# Patient Record
Sex: Female | Born: 1985
Health system: Southern US, Community
[De-identification: ages and names within clinical notes are randomized; demographics above are authoritative.]

## PROBLEM LIST (undated history)

## (undated) DIAGNOSIS — B9689 Other specified bacterial agents as the cause of diseases classified elsewhere: Secondary | ICD-10-CM

## (undated) DIAGNOSIS — N760 Acute vaginitis: Secondary | ICD-10-CM

## (undated) HISTORY — PX: WISDOM TOOTH EXTRACTION: SHX21

## (undated) HISTORY — DX: Other specified bacterial agents as the cause of diseases classified elsewhere: N76.0

## (undated) HISTORY — DX: Other specified bacterial agents as the cause of diseases classified elsewhere: B96.89

---

## 2011-11-01 ENCOUNTER — Inpatient Hospital Stay: Payer: Self-pay | Admitting: Obstetrics & Gynecology

## 2013-02-11 ENCOUNTER — Other Ambulatory Visit: Payer: Self-pay | Admitting: Physician Assistant

## 2013-02-11 LAB — COMPREHENSIVE METABOLIC PANEL
Albumin: 3.5 g/dL (ref 3.4–5.0)
Alkaline Phosphatase: 97 U/L (ref 50–136)
Anion Gap: 2 — ABNORMAL LOW (ref 7–16)
Calcium, Total: 8.2 mg/dL — ABNORMAL LOW (ref 8.5–10.1)
Chloride: 106 mmol/L (ref 98–107)
EGFR (African American): >60
Glucose: 87 mg/dL (ref 65–99)
Osmolality: 273 (ref 275–301)
SGOT(AST): 63 U/L — ABNORMAL HIGH (ref 15–37)
SGPT (ALT): 77 U/L (ref 12–78)
Total Protein: 7.6 g/dL (ref 6.4–8.2)

## 2013-02-11 LAB — CBC WITH DIFFERENTIAL/PLATELET
Eosinophil #: 0.1 10*3/uL (ref 0.0–0.7)
Eosinophil %: 2.3 %
HGB: 12.4 g/dL (ref 12.0–16.0)
Lymphocyte %: 42.3 %
MCHC: 34.9 g/dL (ref 32.0–36.0)
MCV: 89 fL (ref 80–100)
Monocyte #: 0.7 x10 3/mm (ref 0.2–0.9)
RBC: 4 10*6/uL (ref 3.80–5.20)
RDW: 14 % (ref 11.5–14.5)
WBC: 5.3 10*3/uL (ref 3.6–11.0)

## 2013-11-22 ENCOUNTER — Observation Stay: Payer: Self-pay

## 2013-11-23 ENCOUNTER — Inpatient Hospital Stay: Payer: Self-pay

## 2013-11-23 LAB — CBC WITH DIFFERENTIAL/PLATELET
Basophil #: 0.1 10*3/uL (ref 0.0–0.1)
Basophil %: 0.6 %
EOS PCT: 0.5 %
Eosinophil #: 0.1 10*3/uL (ref 0.0–0.7)
HCT: 40.2 % (ref 35.0–47.0)
HGB: 13.6 g/dL (ref 12.0–16.0)
Lymphocyte #: 2.5 10*3/uL (ref 1.0–3.6)
Lymphocyte %: 22.4 %
MCH: 32.1 pg (ref 26.0–34.0)
MCHC: 33.9 g/dL (ref 32.0–36.0)
MCV: 95 fL (ref 80–100)
MONOS PCT: 9.6 %
Monocyte #: 1.1 x10 3/mm — ABNORMAL HIGH (ref 0.2–0.9)
Neutrophil #: 7.5 10*3/uL — ABNORMAL HIGH (ref 1.4–6.5)
Neutrophil %: 66.9 %
Platelet: 239 10*3/uL (ref 150–440)
RBC: 4.25 10*6/uL (ref 3.80–5.20)
RDW: 13.5 % (ref 11.5–14.5)
WBC: 11.1 10*3/uL — ABNORMAL HIGH (ref 3.6–11.0)

## 2013-11-24 LAB — HEMATOCRIT: HCT: 36.2 % (ref 35.0–47.0)

## 2015-02-01 ENCOUNTER — Emergency Department: Payer: Self-pay | Admitting: Emergency Medicine

## 2015-02-01 LAB — RAPID INFLUENZA A&B ANTIGENS

## 2015-03-14 NOTE — H&P (Signed)
L&D Evaluation:  History:  Presents with leaking fluid   Patient's Medical History No Chronic Illness   Patient's Surgical History none   Medications Pre Natal Vitamins   Allergies NKDA   Social History none   ROS:  ROS All systems were reviewed.  HEENT, CNS, GI, GU, Respiratory, CV, Renal and Musculoskeletal systems were found to be normal.   Exam:  Vital Signs stable   Urine Protein not completed   General no apparent distress   Mental Status clear   Abdomen gravid, non-tender   Estimated Fetal Weight Average for gestational age   Fetal Position vtx   Edema no edema   Pelvic 1-2/50/-2   Mebranes Intact, NTZ -, Crist FatFern -, speculum exam -   FHT normal rate with no decels   Fetal Heart Rate 145   Ucx irregular   Ucx Pain Scale 0   Skin dry   Impression:  Impression reactive NST, leukorrhea   Plan:  Plan EFM/NST, discharge   Electronic Signatures: Ulyses AmorBurr, Melody N (CNM)  (Signed 19-Jan-15 10:21)  Authored: L&D Evaluation   Last Updated: 19-Jan-15 10:21 by Ulyses AmorBurr, Melody N (CNM)

## 2015-03-14 NOTE — H&P (Signed)
L&D Evaluation:  History:  HPI 29yo MWF presents with c/o contractions all day, [id number], at 972w4d. Normal PNC to date; did have LLP with resolution by 30 weeks.   Presents with contractions   Patient's Medical History No Chronic Illness   Patient's Surgical History none   Medications Pre Natal Vitamins   Allergies NKDA   Social History none   Family History Non-Contributory   ROS:  ROS All systems were reviewed.  HEENT, CNS, GI, GU, Respiratory, CV, Renal and Musculoskeletal systems were found to be normal.   Exam:  Vital Signs stable   Urine Protein not completed   General no apparent distress   Mental Status clear   Chest clear   Heart normal sinus rhythm   Abdomen gravid, tender with contractions   Estimated Fetal Weight Average for gestational age   Fetal Position vtx   Back no CVAT   Edema no edema   Pelvic 3/80/-1 per RN exam   Mebranes Intact   FHT normal rate with no decels   Fetal Heart Rate 150   Ucx regular   Ucx Frequency 5 min   Length of each Contraction 60 seconds   Ucx Pain Scale 8   Skin dry   Impression:  Impression early labor   Electronic Signatures: Ulyses AmorBurr, Joeleen Wortley N (CNM)  (Signed 20-Jan-15 18:18)  Authored: L&D Evaluation   Last Updated: 20-Jan-15 18:18 by Ulyses AmorBurr, Sean Malinowski N (CNM)

## 2015-03-20 DIAGNOSIS — K6289 Other specified diseases of anus and rectum: Secondary | ICD-10-CM

## 2015-05-04 ENCOUNTER — Encounter: Payer: Self-pay | Admitting: Obstetrics and Gynecology

## 2015-05-23 ENCOUNTER — Encounter: Payer: Self-pay | Admitting: Obstetrics and Gynecology

## 2015-05-23 ENCOUNTER — Ambulatory Visit (INDEPENDENT_AMBULATORY_CARE_PROVIDER_SITE_OTHER): Payer: 59 | Admitting: Obstetrics and Gynecology

## 2015-05-23 VITALS — BP 119/77 | HR 63 | Ht 70.0 in | Wt 162.5 lb

## 2015-05-23 DIAGNOSIS — Z01419 Encounter for gynecological examination (general) (routine) without abnormal findings: Secondary | ICD-10-CM | POA: Diagnosis not present

## 2015-05-23 DIAGNOSIS — B359 Dermatophytosis, unspecified: Secondary | ICD-10-CM | POA: Diagnosis not present

## 2015-05-23 MED ORDER — FLUCONAZOLE 100 MG PO TABS: 100.0000 mg | ORAL_TABLET | Freq: Every day | ORAL | Status: DC

## 2015-05-23 MED ORDER — HYDROXYZINE HCL 25 MG PO TABS: 25.0000 mg | ORAL_TABLET | Freq: Four times a day (QID) | ORAL | Status: DC | PRN

## 2015-05-23 MED ORDER — KETOCONAZOLE 2 % EX SHAM: 1.0000 "application " | MEDICATED_SHAMPOO | CUTANEOUS | Status: DC

## 2015-05-23 NOTE — Patient Instructions (Addendum)
Thank you for enrolling in [facility]. Please follow the instructions below to securely access your online medical record. MyChart allows you to send messages to your doctor, view your test results, renew your prescriptions, schedule appointments, and more.  How Do I Sign Up? 1. In your Internet browser, go to [email or web address]. 2. Click on the New  User? link in the Sign In box.  3. Enter your MyChart Access Code exactly as it appears below. You will not need to use this code after you have completed the sign-up process. If you do not sign up before the expiration date, you must request a new code. MyChart Access Code: [email or web address] Expires: 07/22/2015  9:44 AM  4. Enter the last four digits of your Social Security Number (xxxx) and Date of Birth (mm/dd/yyyy) as indicated and click Next. You will be taken to the next sign-up page. 5. Create a MyChart ID. This will be your MyChart login ID and cannot be changed, so think of one that is secure and easy to remember. 6. Create a MyChart password. You can change your password at any time. 7. Enter your Password Reset Question and Answer and click Next. This can be used at a later time if you forget your password.  8. Select your communication preference, and if applicable enter your e-mail address. You will receive e-mail notification when new information is available in MyChart by choosing to receive e-mail notifications and filling in your e-mail. 9. Click Sign In. You can now view your medical record.   Additional Information If you have questions, you can email [email or web address] or call [phone] to talk to our [facility] staff. Remember, MyChart is NOT to be used for urgent needs. For medical emergencies, dial 911.   Health Maintenance Adopting a healthy lifestyle and getting preventive care can go a long way to promote health and wellness. Talk with your health care provider about what schedule of regular  examinations is right for you. This is a good chance for you to check in with your provider about disease prevention and staying healthy. In between checkups, there are plenty of things you can do on your own. Experts have done a lot of research about which lifestyle changes and preventive measures are most likely to keep you healthy. Ask your health care provider for more information. WEIGHT AND DIET  Eat a healthy diet  Be sure to include plenty of vegetables, fruits, low-fat dairy products, and lean protein.  Do not eat a lot of foods high in solid fats, added sugars, or salt.  Get regular exercise. This is one of the most important things you can do for your health.  Most adults should exercise for at least 150 minutes each week. The exercise should increase your heart rate and make you sweat (moderate-intensity exercise).  Most adults should also do strengthening exercises at least twice a week. This is in addition to the moderate-intensity exercise.  Maintain a healthy weight  Body mass index (BMI) is a measurement that can be used to identify possible weight problems. It estimates body fat based on height and weight. Your health care provider can help determine your BMI and help you achieve or maintain a healthy weight.  For females 67 years of age and older:   A BMI below 18.5 is considered underweight.  A BMI of 18.5 to 24.9 is normal.  A BMI of 25 to 29.9 is considered overweight.  A BMI of 30 and above is  considered obese.  Watch levels of cholesterol and blood lipids  You should start having your blood tested for lipids and cholesterol at 30 years of age, then have this test every 5 years.  You may need to have your cholesterol levels checked more often if:  Your lipid or cholesterol levels are high.  You are older than 30 years of age.  You are at high risk for heart disease.  CANCER SCREENING   Lung Cancer  Lung cancer screening is recommended for adults  28-45 years old who are at high risk for lung cancer because of a history of smoking.  A yearly low-dose CT scan of the lungs is recommended for people who:  Currently smoke.  Have quit within the past 15 years.  Have at least a 30-pack-year history of smoking. A pack year is smoking an average of one pack of cigarettes a day for 1 year.  Yearly screening should continue until it has been 15 years since you quit.  Yearly screening should stop if you develop a health problem that would prevent you from having lung cancer treatment.  Breast Cancer  Practice breast self-awareness. This means understanding how your breasts normally appear and feel.  It also means doing regular breast self-exams. Let your health care provider know about any changes, no matter how small.  If you are in your 20s or 30s, you should have a clinical breast exam (CBE) by a health care provider every 1-3 years as part of a regular health exam.  If you are 27 or older, have a CBE every year. Also consider having a breast X-ray (mammogram) every year.  If you have a family history of breast cancer, talk to your health care provider about genetic screening.  If you are at high risk for breast cancer, talk to your health care provider about having an MRI and a mammogram every year.  Breast cancer gene (BRCA) assessment is recommended for women who have family members with BRCA-related cancers. BRCA-related cancers include:  Breast.  Ovarian.  Tubal.  Peritoneal cancers.  Results of the assessment will determine the need for genetic counseling and BRCA1 and BRCA2 testing. Cervical Cancer Routine pelvic examinations to screen for cervical cancer are no longer recommended for nonpregnant women who are considered low risk for cancer of the pelvic organs (ovaries, uterus, and vagina) and who do not have symptoms. A pelvic examination may be necessary if you have symptoms including those associated with pelvic  infections. Ask your health care provider if a screening pelvic exam is right for you.   The Pap test is the screening test for cervical cancer for women who are considered at risk.  If you had a hysterectomy for a problem that was not cancer or a condition that could lead to cancer, then you no longer need Pap tests.  If you are older than 65 years, and you have had normal Pap tests for the past 10 years, you no longer need to have Pap tests.  If you have had past treatment for cervical cancer or a condition that could lead to cancer, you need Pap tests and screening for cancer for at least 20 years after your treatment.  If you no longer get a Pap test, assess your risk factors if they change (such as having a new sexual partner). This can affect whether you should start being screened again.  Some women have medical problems that increase their chance of getting cervical cancer. If this is  the case for you, your health care provider may recommend more frequent screening and Pap tests.  The human papillomavirus (HPV) test is another test that may be used for cervical cancer screening. The HPV test looks for the virus that can cause cell changes in the cervix. The cells collected during the Pap test can be tested for HPV.  The HPV test can be used to screen women 22 years of age and older. Getting tested for HPV can extend the interval between normal Pap tests from three to five years.  An HPV test also should be used to screen women of any age who have unclear Pap test results.  After 30 years of age, women should have HPV testing as often as Pap tests.  Colorectal Cancer  This type of cancer can be detected and often prevented.  Routine colorectal cancer screening usually begins at 30 years of age and continues through 30 years of age.  Your health care provider may recommend screening at an earlier age if you have risk factors for colon cancer.  Your health care provider may also  recommend using home test kits to check for hidden blood in the stool.  A small camera at the end of a tube can be used to examine your colon directly (sigmoidoscopy or colonoscopy). This is done to check for the earliest forms of colorectal cancer.  Routine screening usually begins at age 22.  Direct examination of the colon should be repeated every 5-10 years through 30 years of age. However, you may need to be screened more often if early forms of precancerous polyps or small growths are found. Skin Cancer  Check your skin from head to toe regularly.  Tell your health care provider about any new moles or changes in moles, especially if there is a change in a mole's shape or color.  Also tell your health care provider if you have a mole that is larger than the size of a pencil eraser.  Always use sunscreen. Apply sunscreen liberally and repeatedly throughout the day.  Protect yourself by wearing long sleeves, pants, a wide-brimmed hat, and sunglasses whenever you are outside. HEART DISEASE, DIABETES, AND HIGH BLOOD PRESSURE   Have your blood pressure checked at least every 1-2 years. High blood pressure causes heart disease and increases the risk of stroke.  If you are between 58 years and 29 years old, ask your health care provider if you should take aspirin to prevent strokes.  Have regular diabetes screenings. This involves taking a blood sample to check your fasting blood sugar level.  If you are at a normal weight and have a low risk for diabetes, have this test once every three years after 30 years of age.  If you are overweight and have a high risk for diabetes, consider being tested at a younger age or more often. PREVENTING INFECTION  Hepatitis B  If you have a higher risk for hepatitis B, you should be screened for this virus. You are considered at high risk for hepatitis B if:  You were born in a country where hepatitis B is common. Ask your health care provider which  countries are considered high risk.  Your parents were born in a high-risk country, and you have not been immunized against hepatitis B (hepatitis B vaccine).  You have HIV or AIDS.  You use needles to inject street drugs.  You live with someone who has hepatitis B.  You have had sex with someone who has  hepatitis B.  You get hemodialysis treatment.  You take certain medicines for conditions, including cancer, organ transplantation, and autoimmune conditions. Hepatitis C  Blood testing is recommended for:  Everyone born from [birth year] through 1966.  Anyone with known risk factors for hepatitis C. Sexually transmitted infections (STIs)  You should be screened for sexually transmitted infections (STIs) including gonorrhea and chlamydia if:  You are sexually active and are younger than 30 years of age.  You are older than 30 years of age and your health care provider tells you that you are at risk for this type of infection.  Your sexual activity has changed since you were last screened and you are at an increased risk for chlamydia or gonorrhea. Ask your health care provider if you are at risk.  If you do not have HIV, but are at risk, it may be recommended that you take a prescription medicine daily to prevent HIV infection. This is called pre-exposure prophylaxis (PrEP). You are considered at risk if:  You are sexually active and do not regularly use condoms or know the HIV status of your partner(s).  You take drugs by injection.  You are sexually active with a partner who has HIV. Talk with your health care provider about whether you are at high risk of being infected with HIV. If you choose to begin PrEP, you should first be tested for HIV. You should then be tested every 3 months for as long as you are taking PrEP.  PREGNANCY   If you are premenopausal and you may become pregnant, ask your health care provider about preconception counseling.  If you may become pregnant, take  400 to 800 micrograms (mcg) of folic acid every day.  If you want to prevent pregnancy, talk to your health care provider about birth control (contraception). OSTEOPOROSIS AND MENOPAUSE   Osteoporosis is a disease in which the bones lose minerals and strength with aging. This can result in serious bone fractures. Your risk for osteoporosis can be identified using a bone density scan.  If you are 21 years of age or older, or if you are at risk for osteoporosis and fractures, ask your health care provider if you should be screened.  Ask your health care provider whether you should take a calcium or vitamin D supplement to lower your risk for osteoporosis.  Menopause may have certain physical symptoms and risks.  Hormone replacement therapy may reduce some of these symptoms and risks. Talk to your health care provider about whether hormone replacement therapy is right for you.  HOME CARE INSTRUCTIONS   Schedule regular health, dental, and eye exams.  Stay current with your immunizations.   Do not use any tobacco products including cigarettes, chewing tobacco, or electronic cigarettes.  If you are pregnant, do not drink alcohol.  If you are breastfeeding, limit how much and how often you drink alcohol.  Limit alcohol intake to no more than 1 drink per day for nonpregnant women. One drink equals 12 ounces of beer, 5 ounces of wine, or 1 ounces of hard liquor.  Do not use street drugs.  Do not share needles.  Ask your health care provider for help if you need support or information about quitting drugs.  Tell your health care provider if you often feel depressed.  Tell your health care provider if you have ever been abused or do not feel safe at home. Document Released: 05/06/2011 Document Revised: 03/07/2014 Document Reviewed: 09/22/2013 [facility] Patient Information 2015 [place], [place].  This information is not intended to replace advice given to you by your health care provider.  Make sure you discuss any questions you have with your health care provider.

## 2015-05-23 NOTE — Progress Notes (Signed)
  Subjective:     Lori Mcgee is a 29 y.o. female and is here for a comprehensive physical exam. The patient reports problems - btb for about three weeks after menses some months, has paragard IUD in place x 1 year, bleeding is dark brown and light; also reports resh x 3 weeks- started on chin, now on legs and wrist- seen in urgent care and took steriod taper and steriod cream, slightly better but persistant.  History   Social History  . Marital Status: Married    Spouse Name: N/A  . Number of Children: N/A  . Years of Education: N/A   Occupational History  . Not on file.   Social History Main Topics  . Smoking status: Never Smoker   . Smokeless tobacco: Never Used  . Alcohol Use: No  . Drug Use: No  . Sexual Activity: Yes    Birth Control/ Protection: IUD   Other Topics Concern  . Not on file   Social History Narrative   Health Maintenance  Topic Date Due  . HIV Screening  10/26/2000  . PAP SMEAR  10/27/2003  . TETANUS/TDAP  10/26/2004  . INFLUENZA VACCINE  06/05/2015    The following portions of the patient's history were reviewed and updated as appropriate: allergies, current medications, past family history, past medical history, past social history, past surgical history and problem list.  Review of Systems A comprehensive review of systems was negative except for: Genitourinary: positive for BTB Integument/breast: positive for pruritus and rash   Objective:    General appearance: alert, cooperative and appears stated age Neck: no adenopathy, no carotid bruit, no JVD, supple, symmetrical, trachea midline and thyroid not enlarged, symmetric, no tenderness/mass/nodules Lungs: clear to auscultation bilaterally Breasts: normal appearance, no masses or tenderness Heart: regular rate and rhythm, S1, S2 normal, no murmur, click, rub or gallop Abdomen: soft, non-tender; bowel sounds normal; no masses,  no organomegaly Pelvic: cervix normal in appearance, external  genitalia normal, no adnexal masses or tenderness, no cervical motion tenderness, rectovaginal septum normal, uterus normal size, shape, and consistency and vagina normal without discharge Extremities: extremities normal, atraumatic, no cyanosis or edema Skin: raised rash on upper legs and bilateral wrsit- c/w tinea   IUD string noted with scant dark brown old blood at cervix Assessment:    Healthy female exam. BTB with Paragard; Tinea      Plan:  Pap obtianed, continue health maintenance; RTC 1 year for AE LoLoEstrine sample given -to take 1 pill a day for 3-4 days as needed to stop BTB Rx for Nizoral to apply bid x 1 week and wash off after 1 hour, diflucan 100mg  daily x 7-14 days and vistarile prn use for itching.  RTC as needed if above doesn't resolve.   See After Visit Summary for Counseling Recommendations  Ericca Labra Ines BloomerBurr, CNM

## 2015-05-24 LAB — PAP IG W/ RFLX HPV ASCU: PAP SMEAR COMMENT: 0

## 2015-05-25 ENCOUNTER — Encounter: Payer: Self-pay | Admitting: *Deleted

## 2016-02-28 ENCOUNTER — Telehealth: Payer: Self-pay | Admitting: Obstetrics and Gynecology

## 2016-02-28 NOTE — Telephone Encounter (Signed)
SHE HAS AN IUD HAD REG PERIOD AND THEN IT WENT AWAY, THEN IT CAME BACK FOR A COUPLE DAYS AND SHE HAD LIKE 3 PERIODS IN A ROW. sHE HAS HAD HER IUD PARAGARD FOR A YR NOW. JUST WONDERING WHY HER PERIOD WAS SO IRREGULAR.

## 2016-02-28 NOTE — Telephone Encounter (Signed)
Called pt she has had period 3 x this month, no increased stress, denies heavy bleeding or cramping, pls advise

## 2016-03-01 NOTE — Telephone Encounter (Signed)
Gave a few packs of tatula to try to regulate cycles.

## 2016-03-11 ENCOUNTER — Encounter: Payer: Self-pay | Admitting: Physician Assistant

## 2016-03-11 ENCOUNTER — Ambulatory Visit: Payer: Self-pay | Admitting: Physician Assistant

## 2016-03-11 VITALS — BP 122/80 | HR 80 | Temp 98.9°F

## 2016-03-11 DIAGNOSIS — J018 Other acute sinusitis: Secondary | ICD-10-CM

## 2016-03-11 MED ORDER — FLUTICASONE PROPIONATE 50 MCG/ACT NA SUSP: 2.0000 | Freq: Every day | NASAL | Status: DC

## 2016-03-11 MED ORDER — AMOXICILLIN 875 MG PO TABS: 875.0000 mg | ORAL_TABLET | Freq: Two times a day (BID) | ORAL | Status: DC

## 2016-03-11 NOTE — Progress Notes (Signed)
S: C/o runny nose and congestion for 3 days, no fever, chills, cp/sob, v/d; mucus is green and thick, cough is sporadic, c/o of facial and dental pain.   Using otc meds:   O: PE: vitals wnl, nad, perrl eomi, normocephalic, tms dull, nasal mucosa red and swollen, throat injected, neck supple no lymph, lungs c t a, cv rrr, neuro intact  A:  Acute sinusitis   P: amoxil 875mg  bid x 10d, flonase; drink fluids, continue regular meds , use otc meds of choice, return if not improving in 5 days, return earlier if worsening

## 2016-05-22 ENCOUNTER — Encounter: Payer: Self-pay | Admitting: Obstetrics and Gynecology

## 2016-05-22 ENCOUNTER — Ambulatory Visit (INDEPENDENT_AMBULATORY_CARE_PROVIDER_SITE_OTHER): Payer: 59 | Admitting: Obstetrics and Gynecology

## 2016-05-22 VITALS — BP 99/67 | HR 94 | Ht 69.0 in | Wt 167.3 lb

## 2016-05-22 DIAGNOSIS — N760 Acute vaginitis: Secondary | ICD-10-CM | POA: Diagnosis not present

## 2016-05-22 DIAGNOSIS — A499 Bacterial infection, unspecified: Secondary | ICD-10-CM | POA: Diagnosis not present

## 2016-05-22 DIAGNOSIS — B9689 Other specified bacterial agents as the cause of diseases classified elsewhere: Secondary | ICD-10-CM

## 2016-05-22 MED ORDER — TINIDAZOLE 500 MG PO TABS: 500.0000 mg | ORAL_TABLET | Freq: Two times a day (BID) | ORAL | Status: DC

## 2016-05-22 NOTE — Progress Notes (Signed)
Subjective:     Patient ID: Lori Mcgee, female   DOB: 10-May-1986, 30 y.o.   MRN: [id number]  HPI Reports strong fishy odor vaginally x 3 days with slight increase in vaginal discharge- [place] and noted after that.  Review of Systems See above    Objective:   Physical Exam A&O x4  well groomed female in no distress Pelvic exam: normal external genitalia, vulva, vagina, cervix, uterus and adnexa, CERVIX: cervical discharge present - white and malodorous, WET MOUNT done - results: clue cells, excessive bacteria, + Whiff test.    Assessment:     BV     Plan:     Tindamax 500mg  bid x 5 days with refills sent in RTC as needed  Melody Crescent ValleyShamble[place], CNM

## 2016-05-23 ENCOUNTER — Encounter: Payer: Self-pay | Admitting: Obstetrics and Gynecology

## 2016-05-23 ENCOUNTER — Ambulatory Visit (INDEPENDENT_AMBULATORY_CARE_PROVIDER_SITE_OTHER): Payer: 59 | Admitting: Obstetrics and Gynecology

## 2016-05-23 ENCOUNTER — Encounter: Payer: 59 | Admitting: Obstetrics and Gynecology

## 2016-05-23 VITALS — BP 105/53 | HR 78 | Wt 169.4 lb

## 2016-05-23 DIAGNOSIS — Z01419 Encounter for gynecological examination (general) (routine) without abnormal findings: Secondary | ICD-10-CM

## 2016-05-23 NOTE — Patient Instructions (Signed)
  Place annual gynecologic exam patient instructions here.  Thank you for enrolling in MyChart. Please follow the instructions below to securely access your online medical record. MyChart allows you to send messages to your doctor, view your test results, manage appointments, and more.   How Do I Sign Up? 1. In your Internet browser, go to [employer] and enter [email or web address]. 2. Click on the Sign Up Now link in the Sign In box. You will see the New Member Sign Up page. 3. Enter your MyChart Access Code exactly as it appears below. You will not need to use this code after you've completed the sign-up process. If you do not sign up before the expiration date, you must request a new code.  MyChart Access Code: [email or web address] Expires: 07/21/2016  8:37 AM  4. Enter your Social Security Number ([id number]) and Date of Birth (mm/dd/yyyy) as indicated and click Submit. You will be taken to the next sign-up page. 5. Create a MyChart ID. This will be your MyChart login ID and cannot be changed, so think of one that is secure and easy to remember. 6. Create a MyChart password. You can change your password at any time. 7. Enter your Password Reset Question and Answer. This can be used at a later time if you forget your password.  8. Enter your e-mail address. You will receive e-mail notification when new information is available in MyChart. 9. Click Sign Up. You can now view your medical record.   Additional Information Remember, MyChart is NOT to be used for urgent needs. For medical emergencies, dial 911.

## 2016-05-23 NOTE — Progress Notes (Signed)
Subjective:   Lori Mcgee is a 30 y.o. 743P0 Caucasian female here for a routine well-woman exam.  Patient's last menstrual period was 04/27/2016.    Current complaints: BTB with paragard PCP: me       doesn't desire labs  Social History: Sexual: heterosexual Marital Status: married Living situation: with family Occupation: unknown occupation Tobacco/alcohol: no tobacco use Illicit drugs: no history of illicit drug use  The following portions of the patient's history were reviewed and updated as appropriate: allergies, current medications, past family history, past medical history, past social history, past surgical history and problem list.  Past Medical History History reviewed. No pertinent past medical history.  Past Surgical History History reviewed. No pertinent past surgical history.  Gynecologic History G3P0  Patient's last menstrual period was 04/27/2016. Contraception: IUD Last Pap: 2016. Results were: normal   Obstetric History OB History  Gravida Para Term Preterm AB SAB TAB Ectopic Multiple Living  3         3    # Outcome Date GA Lbr Len/2nd Weight Sex Delivery Anes PTL Lv  3 Gravida 2015    M Vag-Spont   Y  2 Gravida 2012    F Vag-Spont   Y  1 Gravida 2010    M Vag-Spont   Y      Current Medications Current Outpatient Prescriptions on File Prior to Visit  Medication Sig Dispense Refill  . PARAGARD INTRAUTERINE COPPER IU by Intrauterine route.    . tinidazole (TINDAMAX) 500 MG tablet Take 1 tablet (500 mg total) by mouth 2 (two) times daily. 10 tablet 2   No current facility-administered medications on file prior to visit.    Review of Systems Patient denies any headaches, blurred vision, shortness of breath, chest pain, abdominal pain, problems with bowel movements, urination, or intercourse.  Objective:  BP 105/53 mmHg  Pulse 78  Wt 169 lb 6.4 oz (76.839 kg)  LMP 04/27/2016 Physical Exam  General:  Well developed, well nourished, no acute  distress. She is alert and oriented x3. Skin:  Warm and dry Neck:  Midline trachea, no thyromegaly or nodules Cardiovascular: Regular rate and rhythm, no murmur heard Lungs:  Effort normal, all lung fields clear to auscultation bilaterally Breasts:  No dominant palpable mass, retraction, or nipple discharge Abdomen:  Soft, non tender, no hepatosplenomegaly or masses Pelvic:  External genitalia is normal in appearance.  The vagina is normal in appearance. The cervix is bulbous, no CMT.  Thin prep pap is not done . Uterus is felt to be normal size, shape, and contour.  No adnexal masses or tenderness noted. Extremities:  No swelling or varicosities noted Psych:  She has a normal mood and affect  Assessment:   Healthy well-woman exam BTB on Paragrad  Plan:  May consider Mirena F/U 1 year for AE, or sooner if needed   Melody Suzan NailerN Shambley, CNM

## 2016-08-16 DIAGNOSIS — M5430 Sciatica, unspecified side: Secondary | ICD-10-CM | POA: Diagnosis not present

## 2016-08-16 DIAGNOSIS — M791 Myalgia: Secondary | ICD-10-CM | POA: Diagnosis not present

## 2016-08-16 DIAGNOSIS — M9905 Segmental and somatic dysfunction of pelvic region: Secondary | ICD-10-CM | POA: Diagnosis not present

## 2016-08-16 DIAGNOSIS — M9901 Segmental and somatic dysfunction of cervical region: Secondary | ICD-10-CM | POA: Diagnosis not present

## 2016-08-16 DIAGNOSIS — R51 Headache: Secondary | ICD-10-CM | POA: Diagnosis not present

## 2016-08-16 DIAGNOSIS — M9903 Segmental and somatic dysfunction of lumbar region: Secondary | ICD-10-CM | POA: Diagnosis not present

## 2016-08-16 DIAGNOSIS — M9902 Segmental and somatic dysfunction of thoracic region: Secondary | ICD-10-CM | POA: Diagnosis not present

## 2016-08-16 DIAGNOSIS — Z72820 Sleep deprivation: Secondary | ICD-10-CM | POA: Diagnosis not present

## 2016-08-16 DIAGNOSIS — M6240 Contracture of muscle, unspecified site: Secondary | ICD-10-CM | POA: Diagnosis not present

## 2016-08-21 ENCOUNTER — Encounter: Payer: 59 | Admitting: Obstetrics and Gynecology

## 2016-08-23 DIAGNOSIS — M9905 Segmental and somatic dysfunction of pelvic region: Secondary | ICD-10-CM | POA: Diagnosis not present

## 2016-08-23 DIAGNOSIS — Z72820 Sleep deprivation: Secondary | ICD-10-CM | POA: Diagnosis not present

## 2016-08-23 DIAGNOSIS — M5430 Sciatica, unspecified side: Secondary | ICD-10-CM | POA: Diagnosis not present

## 2016-08-23 DIAGNOSIS — M791 Myalgia: Secondary | ICD-10-CM | POA: Diagnosis not present

## 2016-08-23 DIAGNOSIS — M9903 Segmental and somatic dysfunction of lumbar region: Secondary | ICD-10-CM | POA: Diagnosis not present

## 2016-08-23 DIAGNOSIS — M9901 Segmental and somatic dysfunction of cervical region: Secondary | ICD-10-CM | POA: Diagnosis not present

## 2016-08-23 DIAGNOSIS — M9902 Segmental and somatic dysfunction of thoracic region: Secondary | ICD-10-CM | POA: Diagnosis not present

## 2016-08-23 DIAGNOSIS — M6240 Contracture of muscle, unspecified site: Secondary | ICD-10-CM | POA: Diagnosis not present

## 2016-08-23 DIAGNOSIS — R51 Headache: Secondary | ICD-10-CM | POA: Diagnosis not present

## 2016-08-28 ENCOUNTER — Ambulatory Visit (INDEPENDENT_AMBULATORY_CARE_PROVIDER_SITE_OTHER): Payer: 59 | Admitting: Obstetrics and Gynecology

## 2016-08-28 ENCOUNTER — Encounter: Payer: Self-pay | Admitting: Obstetrics and Gynecology

## 2016-08-28 VITALS — BP 112/74 | HR 67 | Ht 69.5 in | Wt 171.3 lb

## 2016-08-28 DIAGNOSIS — N939 Abnormal uterine and vaginal bleeding, unspecified: Secondary | ICD-10-CM | POA: Insufficient documentation

## 2016-08-28 DIAGNOSIS — Z30431 Encounter for routine checking of intrauterine contraceptive device: Secondary | ICD-10-CM | POA: Diagnosis not present

## 2016-08-28 DIAGNOSIS — B9689 Other specified bacterial agents as the cause of diseases classified elsewhere: Secondary | ICD-10-CM | POA: Insufficient documentation

## 2016-08-28 DIAGNOSIS — N760 Acute vaginitis: Secondary | ICD-10-CM

## 2016-08-28 MED ORDER — METRONIDAZOLE 0.75 % VA GEL: 1.0000 | Freq: Every day | VAGINAL | 0 refills | Status: DC

## 2016-08-28 NOTE — Progress Notes (Signed)
GYN ENCOUNTER NOTE  Subjective:       Lori Mcgee is a 30 y.o. 793P0 female is here for gynecologic evaluation of the following issues:  1. Vaginal discharge- increase in d/c for the past few days with a strong odor. Denies itching, redness or irritation. Hx of bacterial vaginosis a month ago and she states the symptoms are similar.   2. BTB on Paragard.- present since she started Paragard 2.5 years ago. Has become annoying to the point she would like to address the problem. BTB occurs most months, periods last about two weeks off and on. Denies menorrhagia, dyspareunia and pelvic pain. No new partners.  Past history notable for severe dysmenorrhea prior to the utilizing oral contraceptives. Oral contraceptives early on were associated with Loraine LericheMark and abnormal bleeding. Patient does not report any significant dysmenorrhea at this time. She denies deep thrusting dyspareunia. Patient had sister who also had severe dysmenorrhea and mother likewise. No diagnosis of endometriosis in the family   Gynecologic History Patient's last menstrual period was 08/02/2016. Contraception: IUD- Paragard   Obstetric History OB History  Gravida Para Term Preterm AB Living  3         3  SAB TAB Ectopic Multiple Live Births          3    # Outcome Date GA Lbr Len/2nd Weight Sex Delivery Anes PTL Lv  3 Gravida 2015    M Vag-Spont   LIV  2 Gravida 2012    F Vag-Spont   LIV  1 Gravida 2010    M Vag-Spont   LIV      History reviewed. No pertinent past medical history.  History reviewed. No pertinent surgical history.  Current Outpatient Prescriptions on File Prior to Visit  Medication Sig Dispense Refill  . PARAGARD INTRAUTERINE COPPER IU by Intrauterine route.    . tinidazole (TINDAMAX) 500 MG tablet Take 1 tablet (500 mg total) by mouth 2 (two) times daily. (Patient not taking: Reported on 08/28/2016) 10 tablet 2   No current facility-administered medications on file prior to visit.     No Known  Allergies  Social History   Social History  . Marital status: Married    Spouse name: N/A  . Number of children: N/A  . Years of education: N/A   Occupational History  . Not on file.   Social History Main Topics  . Smoking status: Never Smoker  . Smokeless tobacco: Never Used  . Alcohol use No  . Drug use: No  . Sexual activity: Yes    Birth control/ protection: IUD   Other Topics Concern  . Not on file   Social History Narrative  . No narrative on file    Family History  Problem Relation Age of Onset  . Diabetes Father   . Hypertension Father     The following portions of the patient's history were reviewed and updated as appropriate: allergies, current medications, past family history, past medical history, past social history, past surgical history and problem list.  Review of Systems Review of Systems -per history of present illness  Objective:   BP 112/74   Pulse 67   Ht 5' 9.5" (1.765 m)   Wt 171 lb 4.8 oz (77.7 kg)   LMP 08/02/2016   BMI 24.93 kg/m  CONSTITUTIONAL: Well-developed, well-nourished female in no acute distress.  HENT:  Normocephalic, atraumatic.  SKIN: Skin is warm and dry. No rash noted. Not diaphoretic. No erythema. No pallor. NEUROLGIC: Alert and  oriented to person, place, and time. PSYCHIATRIC: Normal mood and affect. Normal behavior. Normal judgment and thought content. CARDIOVASCULAR:Not Examined RESPIRATORY: Not Examined BREASTS: Not Examined PELVIC:  External Genitalia: Normal  BUS: Normal  Vagina: whitish yellow vaginal discharge noted  Cervix: Normal  PROCEDURE: Wet Prep Normal saline-clue cells present; few white blood cells; no Trichomonas KOH-negative-hyphae; positive whiff     Assessment:   1. Abnormal uterine bleeding (AUB)- likely BTB due to Paragard considering length of symptoms. Will consider GC/Chlamydia Probe Amp due to increased risk of infection with IUD.  2. Bacterial Vaginosis- Copious white/yellow  discharge noted on physical exam. Whiff test positive. Clue cells noted on normal saline wet prep.      Plan:  Metrogel vaginal application daily for 5 days GC/Chlamydia Probe Amp Discussed hormonal options of birth control to regulate BTB, patient will consider Mirena and contact clinic when ready. Discussed risks and benefits of switching IUD.   A total of 15 minutes were spent face-to-face with the patient during this encounter and over half of that time dealt with counseling and coordination of care.   Corena Herter, PA-S Herold Harms, MD    I have seen, interviewed, and examined the patient in conjunction with the [facility].A. student and affirm the diagnosis and management plan. Martin A. DeFrancesco, MD, FACOG   Note: This dictation was prepared with Dragon dictation along with smaller phrase technology. Any transcriptional errors that result from this process are unintentional.

## 2016-08-28 NOTE — Patient Instructions (Signed)
1. MetroGel daily 5 days intravaginal 2. Consider switching out ParaGard IUD with Mirena IUD 3. Follow-up when necessary

## 2016-08-31 LAB — GC/CHLAMYDIA PROBE AMP
CHLAMYDIA, DNA PROBE: NEGATIVE
Neisseria gonorrhoeae by PCR: NEGATIVE

## 2016-09-23 ENCOUNTER — Telehealth: Payer: Self-pay | Admitting: Obstetrics and Gynecology

## 2016-09-23 NOTE — Telephone Encounter (Signed)
Pt called and she has had excessive bleeding for about a month now from her IUD and shew anted to be see this week, schedule is completely full, told pt I would need to talk with you to see if we can see her, let me know she is aware if we cant then it will be December before we can see her. Let me know.

## 2016-09-24 ENCOUNTER — Other Ambulatory Visit: Payer: Self-pay | Admitting: Obstetrics and Gynecology

## 2016-09-24 ENCOUNTER — Ambulatory Visit (INDEPENDENT_AMBULATORY_CARE_PROVIDER_SITE_OTHER): Payer: 59

## 2016-09-24 DIAGNOSIS — N938 Other specified abnormal uterine and vaginal bleeding: Secondary | ICD-10-CM

## 2016-09-24 NOTE — Telephone Encounter (Signed)
Please see if she can come in for an ultrasound today or tomorrow to check IUD placement?

## 2016-09-24 NOTE — Telephone Encounter (Signed)
Pt came in for US she is going to make appt to get her IUD removed

## 2016-09-24 NOTE — Telephone Encounter (Signed)
Lori Mcgee would you make appt please for me thanks so much

## 2016-09-24 NOTE — Telephone Encounter (Signed)
pls advise

## 2016-09-24 NOTE — Telephone Encounter (Signed)
CALLED PT AND LEFT HER A MESSAGE LETTING HER KNOW TO CALL US SO WE CAN SCHEDULE [place]

## 2016-09-30 ENCOUNTER — Encounter: Payer: Self-pay | Admitting: Obstetrics and Gynecology

## 2016-10-01 ENCOUNTER — Ambulatory Visit (INDEPENDENT_AMBULATORY_CARE_PROVIDER_SITE_OTHER): Payer: 59 | Admitting: Obstetrics and Gynecology

## 2016-10-01 ENCOUNTER — Encounter: Payer: Self-pay | Admitting: Obstetrics and Gynecology

## 2016-10-01 VITALS — BP 118/57 | HR 71 | Ht 69.5 in | Wt 170.2 lb

## 2016-10-01 DIAGNOSIS — B373 Candidiasis of vulva and vagina: Secondary | ICD-10-CM | POA: Diagnosis not present

## 2016-10-01 DIAGNOSIS — N939 Abnormal uterine and vaginal bleeding, unspecified: Secondary | ICD-10-CM | POA: Diagnosis not present

## 2016-10-01 DIAGNOSIS — N898 Other specified noninflammatory disorders of vagina: Secondary | ICD-10-CM | POA: Diagnosis not present

## 2016-10-01 DIAGNOSIS — N760 Acute vaginitis: Secondary | ICD-10-CM | POA: Diagnosis not present

## 2016-10-01 DIAGNOSIS — B9689 Other specified bacterial agents as the cause of diseases classified elsewhere: Secondary | ICD-10-CM | POA: Diagnosis not present

## 2016-10-01 DIAGNOSIS — B3731 Acute candidiasis of vulva and vagina: Secondary | ICD-10-CM

## 2016-10-01 MED ORDER — METRONIDAZOLE 500 MG PO TABS: 500.0000 mg | ORAL_TABLET | Freq: Two times a day (BID) | ORAL | 0 refills | Status: DC

## 2016-10-01 MED ORDER — TERCONAZOLE 0.4 % VA CREA: 1.0000 | TOPICAL_CREAM | Freq: Every day | VAGINAL | 0 refills | Status: DC

## 2016-10-01 NOTE — Patient Instructions (Signed)
1.  Terazol 7 intravaginal daily 7 days 2. Metronidazole 500 mg twice a for 7 days 3. Avoid simple sugar intake 4. Return as scheduled for ParaGard IUD removal

## 2016-10-01 NOTE — Progress Notes (Signed)
Chief complaint: 1. Chronic vaginitis, recurrent  30 year old married female para 3003, here for recurrent vaginal discharge and odor. Previously seen in October and treated with MetroGel for bacterial vaginosis; now with recurrent symptoms of odor without vaginal itching or burning. Patient does report continued abnormal uterine bleeding with ParaGard IUD. No pelvic pain.  Patient does not report any bladder symptoms. Bowel function is normal.  Past medical history, past surgical history, problem list, medications, and allergies are reviewed  OBJECTIVE: BP (!) 118/57   Pulse 71   Ht 5' 9.5" (1.765 m)   Wt 170 lb 3.2 oz (77.2 kg)   LMP 09/04/2016 (Approximate)   BMI 24.77 kg/m  Pleasant female in no acute distress. She is alert and oriented. Abdomen: Soft, nontender Pelvic exam: External genitalia-normal except for cottage cheeselike discharge on labia; no ulceration; no hyperemia BUS-normal Vagina-moderate thick white yellow secretions in the vault Cervix-no lesions Uterus-not examined Adnexa-not examined Rectovaginal-normal external exam  PROCEDURE: Wet prep  KOH-branching hyphae present; positive whiff test  Normal saline-moderate white blood cells present; clue cells present; no Trichomonas  ASSESSMENT: 1. Mixed vaginitis, recurrent 2. Abnormal uterine bleeding with ParaGard IUD  PLAN: 1. Terazol 7 cream intravaginal daily for 7 days 2. Metronidazole 500 mg twice a day for 7 days 3. Maintain appointment to have IUD removed at next visit as scheduled 4. Consider NuvaRing for contraception after IUD removal to try and optimize abnormal uterine bleeding symptomatology. 5. New swab obtained  A total of 15 minutes were spent face-to-face with the patient during this encounter and over half of that time dealt with counseling and coordination of care.  Herold HarmsMartin A Samatha Anspach, MD  Note: This dictation was prepared with Dragon dictation along with smaller phrase technology.  Any transcriptional errors that result from this process are unintentional.

## 2016-10-04 ENCOUNTER — Encounter: Payer: Self-pay | Admitting: Obstetrics and Gynecology

## 2016-10-04 LAB — NUSWAB BV AND CANDIDA, NAA
CANDIDA ALBICANS, NAA: POSITIVE — AB
CANDIDA GLABRATA, NAA: NEGATIVE

## 2016-10-18 ENCOUNTER — Encounter: Payer: Self-pay | Admitting: Obstetrics and Gynecology

## 2016-10-18 ENCOUNTER — Ambulatory Visit (INDEPENDENT_AMBULATORY_CARE_PROVIDER_SITE_OTHER): Payer: 59 | Admitting: Obstetrics and Gynecology

## 2016-10-18 ENCOUNTER — Other Ambulatory Visit (INDEPENDENT_AMBULATORY_CARE_PROVIDER_SITE_OTHER): Payer: 59

## 2016-10-18 ENCOUNTER — Other Ambulatory Visit: Payer: Self-pay | Admitting: Obstetrics and Gynecology

## 2016-10-18 VITALS — BP 122/55 | HR 69 | Ht 69.5 in | Wt 168.3 lb

## 2016-10-18 DIAGNOSIS — Z30432 Encounter for removal of intrauterine contraceptive device: Secondary | ICD-10-CM | POA: Diagnosis not present

## 2016-10-18 DIAGNOSIS — Z538 Procedure and treatment not carried out for other reasons: Secondary | ICD-10-CM

## 2016-10-18 DIAGNOSIS — T8332XD Displacement of intrauterine contraceptive device, subsequent encounter: Secondary | ICD-10-CM

## 2016-10-18 DIAGNOSIS — Z975 Presence of (intrauterine) contraceptive device: Secondary | ICD-10-CM

## 2016-10-18 MED ORDER — ETONOGESTREL-ETHINYL ESTRADIOL 0.12-0.015 MG/24HR VA RING: VAGINAL_RING | VAGINAL | 12 refills | Status: DC

## 2016-10-18 NOTE — Progress Notes (Signed)
Lori Mcgee is a 30 y.o. year old 433P0 Caucasian female who presents for removal of a paragard IUD. Her  IUD was placed 3 years ago. She has had persistent irregular bleeding and heavy bleeding. She desires removal and switching to Nuvaring.   Patient's last menstrual period was 10/11/2016. BP (!) 122/55   Pulse 69   Ht 5' 9.5" (1.765 m)   Wt 168 lb 4.8 oz (76.3 kg)   LMP 10/11/2016   BMI 24.50 kg/m   Time out was performed.  A graves speculum was placed in the vagina.  The cervix was visualized, and the strings were visible. They were grasped and the strings detached from IUD. Ultrasound guided removal of IUD was attempted, but unsuccessful. Minimal bleeding noted and patient tolerated well. Patient desires another attempt at in office removal.    F/U in 3-4 days for Dr Valentino Saxonherry to attempt ultrasound guided removal. Nuvaring placed today.  Esau Fridman Suzan NailerN Campbell Kray, CNM   Ultrasound Findings:  The uterus is seen with IUD appearing to be in the proper position within the endometrium   Impression: 1. Unsuccessful removal of IUD.  Recommendations: 1.Clinical correlation with the patient's History and Physical Exam. 2. Patient is scheduled to return to see Dr. Valentino Saxonherry for another attempt to remove the IUD in office.  Revonda Humphreyeresa Elliott, RDMS, RVT     Scan reviewed and agree with findings. Consulted Dr Valentino Saxonherry and she will attempt removal in one week. Nuvaring placed.  Lonita Debes [place] ParkShambley, CNM

## 2016-10-22 ENCOUNTER — Ambulatory Visit (INDEPENDENT_AMBULATORY_CARE_PROVIDER_SITE_OTHER): Payer: 59

## 2016-10-22 ENCOUNTER — Encounter: Payer: Self-pay | Admitting: Obstetrics and Gynecology

## 2016-10-22 ENCOUNTER — Ambulatory Visit (INDEPENDENT_AMBULATORY_CARE_PROVIDER_SITE_OTHER): Payer: 59 | Admitting: Obstetrics and Gynecology

## 2016-10-22 VITALS — BP 118/57 | HR 80 | Ht 69.5 in | Wt 170.0 lb

## 2016-10-22 DIAGNOSIS — T8332XD Displacement of intrauterine contraceptive device, subsequent encounter: Secondary | ICD-10-CM

## 2016-10-22 DIAGNOSIS — N926 Irregular menstruation, unspecified: Secondary | ICD-10-CM

## 2016-10-22 MED ORDER — DOXYCYCLINE HYCLATE 100 MG PO CAPS: 100.0000 mg | ORAL_CAPSULE | Freq: Two times a day (BID) | ORAL | 0 refills | Status: DC

## 2016-10-22 NOTE — Progress Notes (Signed)
    GYNECOLOGY PROGRESS NOTE  Subjective:    Patient ID: Lori Mcgee, female    DOB: [birth year], 30 y.o.   MRN: [id number]  HPI  Patient is a 30 y.o. 663P0 female who presents for Paraguard IUD removal under ultrasound guidance due to missing IUD strings (accidentally detached during last attempts at IUD removal). Patient currently with NuvaRing in place due to irregular heavy menses caused by IUD.   The following portions of the patient's history were reviewed and updated as appropriate: allergies, current medications, past family history, past medical history, past social history, past surgical history and problem list.  Review of Systems Pertinent items noted in HPI and remainder of comprehensive ROS otherwise negative.   Objective:   Blood pressure (!) 118/57, pulse 80, height 5' 9.5" (1.765 m), weight 170 lb (77.1 kg), last menstrual period 10/11/2016. General appearance: alert and no distress Abdomen: soft, non-tender; bowel sounds normal; no masses,  no organomegaly Pelvic: external genitalia normal, rectovaginal septum normal and vagina normal without discharge.  Cervix visible with no lesions.  IUD strings not visible. Nuvaring present.    Assessment:   Lost (missing) IUD threads Irregular menses due to IUD  Plan:   See procedure note below for attempted IUD removal.  Multiple failed attempts again today.  Recommend hysteroscopic retrieval at this time.  Will schedule for surgery next week (~ 10/31/16).    IUD REMOVAL PROCEDURE NOTE Patient identified, verbal consent obtained.  Patient was in the dorsal lithotomy position, normal external genitalia was noted.  A speculum was placed in the patient's vagina, normal discharge was noted, no lesions. The cervix was visualized, no lesions, no abnormal discharge.  The strings of the IUD were not visualized, so Kelly forceps were introduced into the endometrial cavity and multiple attempts were made to grab the IUD.  Ultrasound  guidance was used to assist in retrieval.    The Endosee Hysteroscopy device was also used to  better identify the position and location of the IUD.  After multiple failed attempts (tried for ~ 45 minutes), the decision was made to abandon the procedure and to perform hysteroscopic retrieval in the OR.  Patient tolerated the procedure well.  She was prescribed Doxycyline 100 mg BID x 5 days for prophylaxis. due to multiple entrances into the endometrial cavity.     Hildred LaserAnika Lizzette Carbonell, MD [facility]

## 2016-10-27 DIAGNOSIS — T8332XA Displacement of intrauterine contraceptive device, initial encounter: Secondary | ICD-10-CM | POA: Insufficient documentation

## 2016-10-29 MED ORDER — FAMOTIDINE 20 MG PO TABS
ORAL_TABLET | ORAL | Status: AC
  Filled 2016-10-29: qty 1

## 2016-10-30 ENCOUNTER — Encounter
Admission: RE | Admit: 2016-10-30 | Discharge: 2016-10-30 | Disposition: A | Discharge status: Home / Self Care | Payer: 59 | Source: Ambulatory Visit | Attending: Obstetrics and Gynecology | Admitting: Obstetrics and Gynecology

## 2016-10-30 ENCOUNTER — Inpatient Hospital Stay: Admission: RE | Admit: 2016-10-30 | Payer: Self-pay | Source: Ambulatory Visit

## 2016-10-30 DIAGNOSIS — Y848 Other medical procedures as the cause of abnormal reaction of the patient, or of later complication, without mention of misadventure at the time of the procedure: Secondary | ICD-10-CM | POA: Diagnosis not present

## 2016-10-30 DIAGNOSIS — Z975 Presence of (intrauterine) contraceptive device: Secondary | ICD-10-CM | POA: Diagnosis present

## 2016-10-30 DIAGNOSIS — T8332XA Displacement of intrauterine contraceptive device, initial encounter: Secondary | ICD-10-CM | POA: Diagnosis not present

## 2016-10-30 LAB — CBC
HCT: 41.4 % (ref 35.0–47.0)
Hemoglobin: 14 g/dL (ref 12.0–16.0)
MCH: 31.3 pg (ref 26.0–34.0)
MCHC: 33.9 g/dL (ref 32.0–36.0)
MCV: 92.4 fL (ref 80.0–100.0)
PLATELETS: 334 10*3/uL (ref 150–440)
RBC: 4.48 MIL/uL (ref 3.80–5.20)
RDW: 13.1 % (ref 11.5–14.5)
WBC: 6.8 10*3/uL (ref 3.6–11.0)

## 2016-10-30 MED ORDER — DEXTROSE 5 % IV SOLN
200.0000 mg | INTRAVENOUS | Status: AC
  Administered 2016-10-31: 200 mg via INTRAVENOUS
  Filled 2016-10-30: qty 200

## 2016-10-30 NOTE — Patient Instructions (Signed)
Your procedure is scheduled on: 10/31/16 Report to DAY SURGERY. 2ND FLOOR MEDICAL MALL ENTRANCE. To find out your arrival time please call [phone] between 1PM - 3PM on 10/30/16.  Remember: Instructions that are not followed completely may result in serious medical risk, up to and including death, or upon the discretion of your surgeon and anesthesiologist your surgery may need to be rescheduled.    __X__ 1. Do not eat food or drink liquids after midnight. No gum chewing or hard candies.     __X__ 2. No Alcohol for 24 hours before or after surgery.   ____ 3. Bring all medications with you on the day of surgery if instructed.    __X__ 4. Notify your doctor if there is any change in your medical condition     (cold, fever, infections).             ___X__5. No smoking within 24 hours of your surgery.     Do not wear jewelry, make-up, hairpins, clips or nail polish.  Do not wear lotions, powders, or perfumes.   Do not shave 48 hours prior to surgery. Men may shave face and neck.  Do not bring valuables to the hospital.    [facility] not [facility] for any belongings or valuables.               Contacts, dentures or bridgework may not be worn into surgery.  Leave your suitcase in the car. After surgery it may be brought to your room.  For patients admitted to the hospital, discharge time is determined by your                treatment team.   Patients discharged the day of surgery will not be allowed to drive home.   Please read over the following fact sheets that you were given:    ____ Take these medicines the morning of surgery with A SIP OF WATER:    1. NONE  2.   3.   4.  5.  6.  ____ Fleet Enema (as directed)   ____ Use CHG Soap as directed  ____ Use inhalers on the day of surgery  ____ Stop metformin 2 days prior to surgery    ____ Take 1/2 of usual insulin dose the night before surgery and none on the morning of surgery.   ____ Stop  Coumadin/Plavix/aspirin on   __X__ Stop Anti-inflammatories such as Advil, Aleve, Ibuprofen, Motrin, Naproxen, Naprosyn, Goodies,powder, or aspirin products.  OK to take Tylenol.   ____ Stop supplements until after surgery.    ____ Bring C-Pap to the hospital.

## 2016-10-30 NOTE — H&P (Signed)
Subjective:    Patient is a 30 y.o. 803P3003 female scheduled for Hysteroscopic IUD retreival. Indications for procedure are retained IUD with missing threads.   Pertinent Gynecological History: Menses: flow is moderate and irregular  Contraception: IUD (Paraguard) Last pap: normal Date: 05/13/2015  Discussed Blood/Blood Products: no   Menstrual History: OB History    Gravida Para Term Preterm AB Living   3         3   SAB TAB Ectopic Multiple Live Births           623      Menarche age: [age] Patient's last menstrual period was 10/11/2016.    Past Medical History:  Diagnosis Date  . BV (bacterial vaginosis)     Past Surgical History:  Procedure Laterality Date  . WISDOM TOOTH EXTRACTION      OB History  Gravida Para Term Preterm AB Living  3         3  SAB TAB Ectopic Multiple Live Births          3    # Outcome Date GA Lbr Len/2nd Weight Sex Delivery Anes PTL Lv  3 Gravida 2015    M Vag-Spont   LIV  2 Gravida 2012    F Vag-Spont   LIV  1 Gravida 2010    M Vag-Spont   LIV      Social History   Social History  . Marital status: Married    Spouse name: N/A  . Number of children: N/A  . Years of education: N/A   Social History Main Topics  . Smoking status: Never Smoker  . Smokeless tobacco: Never Used  . Alcohol use No  . Drug use: No  . Sexual activity: Yes    Birth control/ protection: Inserts   Other Topics Concern  . None   Social History Narrative  . None    Family History  Problem Relation Age of Onset  . Diabetes Father   . Hypertension Father      No current outpatient prescriptions on file prior to visit.   No current facility-administered medications on file prior to visit.     No Known Allergies  Review of Systems Constitutional: No recent fever/chills/sweats Respiratory: No recent cough/bronchitis Cardiovascular: No chest pain Gastrointestinal: No recent nausea/vomiting/diarrhea Genitourinary: No UTI  symptoms Hematologic/lymphatic:No history of coagulopathy or recent blood thinner use    Objective:    BP (!) 118/57 (BP Location: Left Arm, Patient Position: Sitting, Cuff Size: Normal)   Pulse 80   Ht 5' 9.5" (1.765 m)   Wt 170 lb (77.1 kg)   LMP 10/11/2016   BMI 24.74 kg/m   General:   Normal  Skin:   normal  HEENT:  Normal  Neck:  Supple without Adenopathy or Thyromegaly  Lungs:   Heart:              Breasts:   Abdomen:  Pelvis:  M/S   Extremeties:  Neuro:    clear to auscultation bilaterally   Normal without murmur   Not Examined   soft, non-tender; bowel sounds normal; no masses,  no organomegaly   Exam deferred to OR  No CVAT  Warm/Dry  Normal       Assessment:    Lost IUD threads   Plan:   Counseling: Procedure, risks, reasons, benefits and complications (including injury to bowel, bladder, major blood vessel, ureter, bleeding, possibility of transfusion, infection, or fistula formation) reviewed in detail. Consent signed. Preop testing  ordered. Instructions reviewed, including NPO after midnight.   Hildred LaserAnika Athenia Rys, MD [facility]

## 2016-10-31 ENCOUNTER — Ambulatory Visit
Admission: RE | Admit: 2016-10-31 | Discharge: 2016-10-31 | Disposition: A | Discharge status: Home / Self Care | Payer: 59 | Source: Ambulatory Visit | Attending: Obstetrics and Gynecology | Admitting: Obstetrics and Gynecology

## 2016-10-31 ENCOUNTER — Ambulatory Visit: Payer: 59 | Admitting: Anesthesiology

## 2016-10-31 ENCOUNTER — Encounter
Admission: RE | Disposition: A | Discharge status: Home / Self Care | Payer: Self-pay | Source: Ambulatory Visit | Attending: Obstetrics and Gynecology

## 2016-10-31 ENCOUNTER — Encounter: Payer: Self-pay | Admitting: *Deleted

## 2016-10-31 DIAGNOSIS — Y848 Other medical procedures as the cause of abnormal reaction of the patient, or of later complication, without mention of misadventure at the time of the procedure: Secondary | ICD-10-CM | POA: Insufficient documentation

## 2016-10-31 DIAGNOSIS — T8332XA Displacement of intrauterine contraceptive device, initial encounter: Secondary | ICD-10-CM | POA: Diagnosis not present

## 2016-10-31 DIAGNOSIS — T8332XD Displacement of intrauterine contraceptive device, subsequent encounter: Secondary | ICD-10-CM | POA: Diagnosis not present

## 2016-10-31 DIAGNOSIS — Z30432 Encounter for removal of intrauterine contraceptive device: Secondary | ICD-10-CM | POA: Diagnosis not present

## 2016-10-31 HISTORY — PX: OTHER SURGICAL HISTORY: SHX169

## 2016-10-31 HISTORY — PX: HYSTEROSCOPY: SHX211

## 2016-10-31 LAB — POCT PREGNANCY, URINE: Preg Test, Ur: NEGATIVE

## 2016-10-31 SURGERY — HYSTEROSCOPY
Anesthesia: General | Site: Vagina | Wound class: Clean Contaminated

## 2016-10-31 MED ORDER — PROPOFOL 10 MG/ML IV BOLUS
INTRAVENOUS | Status: DC | PRN
  Administered 2016-10-31: 50 mg via INTRAVENOUS
  Administered 2016-10-31: 150 mg via INTRAVENOUS

## 2016-10-31 MED ORDER — IBUPROFEN 800 MG PO TABS: 800.0000 mg | ORAL_TABLET | Freq: Three times a day (TID) | ORAL | 1 refills | Status: DC | PRN

## 2016-10-31 MED ORDER — IBUPROFEN 800 MG PO TABS
ORAL_TABLET | ORAL | Status: AC
  Administered 2016-10-31: 800 mg
  Filled 2016-10-31: qty 1

## 2016-10-31 MED ORDER — OXYCODONE HCL 5 MG/5ML PO SOLN: 5.0000 mg | Freq: Once | ORAL | Status: DC | PRN

## 2016-10-31 MED ORDER — FENTANYL CITRATE (PF) 100 MCG/2ML IJ SOLN: 25.0000 ug | INTRAMUSCULAR | Status: DC | PRN

## 2016-10-31 MED ORDER — PHENYLEPHRINE 40 MCG/ML (10ML) SYRINGE FOR IV PUSH (FOR BLOOD PRESSURE SUPPORT)
PREFILLED_SYRINGE | INTRAVENOUS | Status: AC
  Filled 2016-10-31: qty 10

## 2016-10-31 MED ORDER — FAMOTIDINE 20 MG PO TABS
ORAL_TABLET | ORAL | Status: AC
  Administered 2016-10-31: 20 mg via ORAL
  Filled 2016-10-31: qty 1

## 2016-10-31 MED ORDER — PROPOFOL 10 MG/ML IV BOLUS
INTRAVENOUS | Status: AC
  Filled 2016-10-31: qty 20

## 2016-10-31 MED ORDER — MEPERIDINE HCL 25 MG/ML IJ SOLN: 6.2500 mg | INTRAMUSCULAR | Status: DC | PRN

## 2016-10-31 MED ORDER — MIDAZOLAM HCL 2 MG/2ML IJ SOLN
INTRAMUSCULAR | Status: DC | PRN
  Administered 2016-10-31: 2 mg via INTRAVENOUS

## 2016-10-31 MED ORDER — FENTANYL CITRATE (PF) 100 MCG/2ML IJ SOLN
INTRAMUSCULAR | Status: DC | PRN
  Administered 2016-10-31: 50 ug via INTRAVENOUS

## 2016-10-31 MED ORDER — LIDOCAINE 2% (20 MG/ML) 5 ML SYRINGE
INTRAMUSCULAR | Status: AC
  Filled 2016-10-31: qty 5

## 2016-10-31 MED ORDER — MIDAZOLAM HCL 2 MG/2ML IJ SOLN
INTRAMUSCULAR | Status: AC
  Filled 2016-10-31: qty 2

## 2016-10-31 MED ORDER — SILVER NITRATE-POT NITRATE 75-25 % EX MISC
CUTANEOUS | Status: AC
  Filled 2016-10-31: qty 1

## 2016-10-31 MED ORDER — PROMETHAZINE HCL 25 MG/ML IJ SOLN: 6.2500 mg | INTRAMUSCULAR | Status: DC | PRN

## 2016-10-31 MED ORDER — LIDOCAINE HCL (CARDIAC) 20 MG/ML IV SOLN
INTRAVENOUS | Status: DC | PRN
  Administered 2016-10-31: 80 mg via INTRAVENOUS

## 2016-10-31 MED ORDER — EPHEDRINE SULFATE 50 MG/ML IJ SOLN
INTRAMUSCULAR | Status: DC | PRN
  Administered 2016-10-31: 10 mg via INTRAVENOUS

## 2016-10-31 MED ORDER — PHENYLEPHRINE HCL 10 MG/ML IJ SOLN
INTRAMUSCULAR | Status: DC | PRN
  Administered 2016-10-31: 120 ug via INTRAVENOUS

## 2016-10-31 MED ORDER — ONDANSETRON HCL 4 MG/2ML IJ SOLN
INTRAMUSCULAR | Status: DC | PRN
Start: 2016-10-31 — End: 2016-10-31
  Administered 2016-10-31: 4 mg via INTRAVENOUS

## 2016-10-31 MED ORDER — ONDANSETRON HCL 4 MG/2ML IJ SOLN
INTRAMUSCULAR | Status: AC
  Filled 2016-10-31: qty 2

## 2016-10-31 MED ORDER — FENTANYL CITRATE (PF) 100 MCG/2ML IJ SOLN
INTRAMUSCULAR | Status: AC
  Filled 2016-10-31: qty 2

## 2016-10-31 MED ORDER — OXYCODONE HCL 5 MG PO TABS: 5.0000 mg | ORAL_TABLET | Freq: Once | ORAL | Status: DC | PRN

## 2016-10-31 MED ORDER — LACTATED RINGERS IV SOLN
INTRAVENOUS | Status: DC
  Administered 2016-10-31 (×2): via INTRAVENOUS

## 2016-10-31 MED ORDER — IBUPROFEN 100 MG/5ML PO SUSP: 800.0000 mg | Freq: Once | ORAL | Status: DC

## 2016-10-31 MED ORDER — EPHEDRINE 5 MG/ML INJ
INTRAVENOUS | Status: AC
  Filled 2016-10-31: qty 10

## 2016-10-31 MED ORDER — DEXAMETHASONE SODIUM PHOSPHATE 10 MG/ML IJ SOLN
INTRAMUSCULAR | Status: AC
  Filled 2016-10-31: qty 1

## 2016-10-31 MED ORDER — FAMOTIDINE 20 MG PO TABS
20.0000 mg | ORAL_TABLET | Freq: Once | ORAL | Status: AC
  Administered 2016-10-31: 20 mg via ORAL

## 2016-10-31 MED ORDER — DEXAMETHASONE SODIUM PHOSPHATE 10 MG/ML IJ SOLN
INTRAMUSCULAR | Status: DC | PRN
  Administered 2016-10-31: 10 mg via INTRAVENOUS

## 2016-10-31 SURGICAL SUPPLY — 15 items
BAG INFUSER PRESSURE 100CC (MISCELLANEOUS) ×2 IMPLANT
CATH ROBINSON RED A/P 16FR (CATHETERS) ×2 IMPLANT
GLOVE BIO SURGEON STRL SZ 6.5 (GLOVE) ×2 IMPLANT
GLOVE INDICATOR 7.0 STRL GRN (GLOVE) ×4 IMPLANT
GLOVE SURG SYN 6.5 ES PF (GLOVE) ×2 IMPLANT
GOWN STRL REUS W/ TWL LRG LVL3 (GOWN DISPOSABLE) ×2 IMPLANT
GOWN STRL REUS W/TWL LRG LVL3 (GOWN DISPOSABLE) ×2
IV LACTATED RINGERS 1000ML (IV SOLUTION) ×2 IMPLANT
KIT RM TURNOVER CYSTO AR (KITS) ×2 IMPLANT
PACK DNC HYST (MISCELLANEOUS) ×2 IMPLANT
PAD OB MATERNITY 4.3X12.25 (PERSONAL CARE ITEMS) ×2 IMPLANT
PAD PREP 24X41 OB/GYN DISP (PERSONAL CARE ITEMS) ×2 IMPLANT
SOL PREP PVP 2OZ (MISCELLANEOUS) ×2
SOLUTION PREP PVP 2OZ (MISCELLANEOUS) ×1 IMPLANT
SURGILUBE 2OZ TUBE FLIPTOP (MISCELLANEOUS) ×2 IMPLANT

## 2016-10-31 NOTE — Transfer of Care (Signed)
Immediate Anesthesia Transfer of Care Note  Patient: Lori Mcgee  Procedure(s) Performed: Procedure(s): HYSTEROSCOPY WITH IUD REMOVAL (N/A)  Patient Location: PACU  Anesthesia Type:General  Level of Consciousness: awake, alert , oriented and patient cooperative  Airway & Oxygen Therapy: Patient Spontanous Breathing and Patient connected to face mask oxygen  Post-op Assessment: Report given to RN, Post -op Vital signs reviewed and stable and Patient moving all extremities X 4  Post vital signs: Reviewed and stable  Last Vitals:  Vitals:   10/31/16 1441 10/31/16 1509  BP: 121/74 106/81  Pulse: 75 62  Resp: 16 16  Temp: 36.7 C     Last Pain:  Vitals:   10/31/16 1509  TempSrc:   PainSc: 3          Complications: No apparent anesthesia complications

## 2016-10-31 NOTE — Discharge Instructions (Signed)
Hysteroscopy, Care After °Refer to this sheet in the next few weeks. These instructions provide you with information on caring for yourself after your procedure. Your health care provider may also give you more specific instructions. Your treatment has been planned according to current medical practices, but problems sometimes occur. Call your health care provider if you have any problems or questions after your procedure.  °WHAT TO EXPECT AFTER THE PROCEDURE °After your procedure, it is typical to have the following: °· You may have some cramping. This normally lasts for a couple days. °· You may have bleeding. This can vary from light spotting for a few days to menstrual-like bleeding for 3-7 days. °HOME CARE INSTRUCTIONS °· Rest for the first 1-2 days after the procedure. °· Only take over-the-counter or prescription medicines as directed by your health care provider. Do not take aspirin. It can increase the chances of bleeding. °· Take showers instead of baths for 2 weeks or as directed by your health care provider. °· Do not drive for 24 hours or as directed. °· Do not drink alcohol while taking pain medicine. °· Do not use tampons, douche, or have sexual intercourse for 2 weeks or until your health care provider says it is okay. °· Take your temperature twice a day for 4-5 days. Write it down each time. °· Follow your health care provider's advice about diet, exercise, and lifting. °· If you develop constipation, you may: °¨ Take a mild laxative if your health care provider approves. °¨ Add bran foods to your diet. °¨ Drink enough fluids to keep your urine clear or pale yellow. °· Try to have someone with you or available to you for the first 24-48 hours, especially if you were given a general anesthetic. °· Follow up with your health care provider as directed. °SEEK MEDICAL CARE IF: °· You feel dizzy or lightheaded. °· You feel sick to your stomach (nauseous). °· You have abnormal vaginal discharge. °· You  have a rash. °· You have pain that is not controlled with medicine. °SEEK IMMEDIATE MEDICAL CARE IF: °· You have bleeding that is heavier than a normal menstrual period. °· You have a fever. °· You have increasing cramps or pain, not controlled with medicine. °· You have new belly (abdominal) pain. °· You pass out. °· You have pain in the tops of your shoulders (shoulder strap areas). °· You have shortness of breath. °This information is not intended to replace advice given to you by your health care provider. Make sure you discuss any questions you have with your health care provider. °Document Released: 08/11/2013 Document Reviewed: 08/11/2013 °Elsevier Interactive Patient Education © 2017 [employer] °AMBULATORY SURGERY  °DISCHARGE INSTRUCTIONS ° ° °1) The drugs that you were given will stay in your system until tomorrow so for the next 24 hours you should not: ° °A) Drive an automobile °B) Make any legal decisions °C) Drink any alcoholic beverage ° ° °2) You may resume regular meals tomorrow.  Today it is better to start with liquids and gradually work up to solid foods. ° °You may eat anything you prefer, but it is better to start with liquids, then soup and crackers, and gradually work up to solid foods. ° ° °3) Please notify your doctor immediately if you have any unusual bleeding, trouble breathing, redness and pain at the surgery site, drainage, fever, or pain not relieved by medication. ° ° ° °4) Additional Instructions: ° ° ° ° ° ° ° °Please contact your   physician with any problems or Same Day Surgery at [phone], Monday through Friday 6 am to 4 pm, or [facility] Main number at [phone]. °

## 2016-10-31 NOTE — Anesthesia Procedure Notes (Signed)
Procedure Name: LMA Insertion Date/Time: 10/31/2016 12:31 PM Performed by: Michaele OfferSAVAGE, Deseree Zemaitis Pre-anesthesia Checklist: Patient identified, Emergency Drugs available, Suction available, Patient being monitored and Timeout performed Patient Re-evaluated:Patient Re-evaluated prior to inductionOxygen Delivery Method: Circle system utilized Preoxygenation: Pre-oxygenation with 100% oxygen Intubation Type: IV induction Ventilation: Mask ventilation without difficulty LMA: LMA inserted LMA Size: 4.0 Number of attempts: 1 Placement Confirmation: positive ETCO2 and breath sounds checked- equal and bilateral Tube secured with: Tape

## 2016-10-31 NOTE — H&P (Signed)
UPDATE TO PREVIOUS HISTORY AND PHYSICAL  The patient has been seen and examined.  H&P is up to date, no changes noted. Patient is a 30 y.o. 843P3003 female scheduled for Hysteroscopic IUD retreival. Indications for procedure are retained IUD with missing threads. All questions answered. She can proceed to the OR for scheduled procedure.   Hildred LaserAnika Anelia Carriveau, MD 10/31/2016 12:26 PM

## 2016-10-31 NOTE — Op Note (Signed)
Procedure(s): HYSTEROSCOPY WITH IUD REMOVAL Procedure Note  Lori Mcgee female 30 y.o. 10/31/2016  Indications: The patient is a 30 y.o. G3P0 female with Paraguard IUD in place who desires removal, lost IUD threads with failed retrieval in office.   Pre-operative Diagnosis: Lost IUD threads  Post-operative Diagnosis: Same  Surgeon: Hildred LaserAnika Aranda Bihm, MD  Assistants: None  Anesthesia: General endotracheal anesthesia  Procedure Details: The patient was seen in the Holding Room. The risks, benefits, complications, treatment options, and expected outcomes were discussed with the patient.  The patient concurred with the proposed plan, giving informed consent.  The site of surgery properly noted/marked. The patient was taken to the Operating Room, identified as Lori Mcgee and the procedure verified as Procedure(s) (LRB): HYSTEROSCOPY WITH IUD REMOVAL (N/A). A Time Out was held and the above information confirmed.  The patient was then placed under general anesthesia without difficulty.  She was placed in the dorsal lithotomy position, and then prepped and draped in the normal sterile fashion.  An exam under anesthesia was performed with the findings noted above.  Straight catheterization was performed. A sterile speculum was inserted into vagina. A single-tooth tenaculum was used to grasp the anterior lip of the cervix. Cervical dilation was performed. A 5 mm hysteroscope was introduced into the uterus under direct visualization. The cavity was allowed to fill, and then the entire cavity was explored with the findings described above. The IUD was then grasped using a hysteroscopic grasper and attempts were made to remove the IUD. Due to embedding, the grasper was unable to remove the IUD, however it was able to align the IUD in the vertical plane. The hysteroscope was then removed from the uterine cavity.  Polyp graspers were inserted into the uterine cavity and the IUD was grasped and removed  in it's entirety. The hysteroscope was then reintroduced into the uterine cavity for final survey.  The uterine cavity was intact, no evidence of perforation of IUD along the posterior wall. The hysteroscope was then removed once again from the uterine cavity.   All instrument and sponge counts were correct at the end of the procedure x 2.  The patient tolerated the procedure well.  She was awakened and taken to the PACU in stable condition.   Findings: Uterus sounded to 7 cm.   Mildly proliferative endometrium.  Only 1 tubal ostium was identified.  No intrauterine masses.  IUD noted to be in horizontal orientation just beyond endocervical canal, with distal end partially embedded in posterior uterine wall.   Estimated Blood Loss:  10 ml      Drains: straight catheterization with 300 cc at start of procedure.          Total IV Fluids:  650 ml  Specimens:  None         Implants: None         Complications:  None; patient tolerated the procedure well.         Disposition: PACU - hemodynamically stable.         Condition: stable   Hildred LaserAnika Mekel Haverstock, MD [facility]

## 2016-10-31 NOTE — Anesthesia Preprocedure Evaluation (Signed)
Anesthesia Evaluation  Patient identified by MRN, date of birth, ID band Patient awake    Reviewed: Allergy & Precautions, NPO status , Patient's Chart, lab work & pertinent test results  History of Anesthesia Complications Negative for: history of anesthetic complications  Airway Mallampati: II  TM Distance: >3 FB Neck ROM: Full    Dental no notable dental hx.    Pulmonary neg pulmonary ROS, neg sleep apnea, neg COPD,    breath sounds clear to auscultation- rhonchi (-) wheezing      Cardiovascular Exercise Tolerance: Good (-) hypertension(-) CAD and (-) Past MI  Rhythm:Regular Rate:Normal - Systolic murmurs and - Diastolic murmurs    Neuro/Psych negative neurological ROS  negative psych ROS   GI/Hepatic negative GI ROS, Neg liver ROS,   Endo/Other  negative endocrine ROSneg diabetes  Renal/GU negative Renal ROS     Musculoskeletal negative musculoskeletal ROS (+)   Abdominal (+) - obese,   Peds  Hematology negative hematology ROS (+)   Anesthesia Other Findings   Reproductive/Obstetrics                             Anesthesia Physical Anesthesia Plan  ASA: I  Anesthesia Plan: General   Post-op Pain Management:    Induction: Intravenous  Airway Management Planned: LMA  Additional Equipment:   Intra-op Plan:   Post-operative Plan:   Informed Consent: I have reviewed the patients History and Physical, chart, labs and discussed the procedure including the risks, benefits and alternatives for the proposed anesthesia with the patient or authorized representative who has indicated his/her understanding and acceptance.   Dental advisory given  Plan Discussed with: CRNA and Anesthesiologist  Anesthesia Plan Comments:         Anesthesia Quick Evaluation

## 2016-11-01 ENCOUNTER — Encounter: Payer: Self-pay | Admitting: Obstetrics and Gynecology

## 2016-11-01 NOTE — Anesthesia Postprocedure Evaluation (Signed)
Anesthesia Post Note  Patient: Lori Mcgee  Procedure(s) Performed: Procedure(s) (LRB): HYSTEROSCOPY WITH IUD REMOVAL (N/A)  Patient location during evaluation: PACU Anesthesia Type: General Level of consciousness: awake and alert and oriented Pain management: pain level controlled Vital Signs Assessment: post-procedure vital signs reviewed and stable Respiratory status: spontaneous breathing Cardiovascular status: blood pressure returned to baseline Anesthetic complications: no     Last Vitals:  Vitals:   10/31/16 1441 10/31/16 1509  BP: 121/74 106/81  Pulse: 75 62  Resp: 16 16  Temp: 36.7 C     Last Pain:  Vitals:   10/31/16 1509  TempSrc:   PainSc: 3                  Monnica Saltsman

## 2016-11-05 ENCOUNTER — Encounter: Payer: Self-pay | Admitting: Certified Nurse Midwife

## 2016-11-05 ENCOUNTER — Ambulatory Visit (INDEPENDENT_AMBULATORY_CARE_PROVIDER_SITE_OTHER): Payer: 59 | Admitting: Certified Nurse Midwife

## 2016-11-05 VITALS — BP 117/69 | HR 68 | Ht 69.5 in | Wt 166.6 lb

## 2016-11-05 DIAGNOSIS — N6311 Unspecified lump in the right breast, upper outer quadrant: Secondary | ICD-10-CM

## 2016-11-05 NOTE — Patient Instructions (Signed)
Vitamin E tablets or capsules What is this medicine? Vitamin E (VAHY tuh min E) is a vitamin found in nature. It is added to a healthy diet to prevent or to treat low vitamin E levels. This vitamin may be used for other purposes; ask your health care provider or pharmacist if you have questions. This medicine may be used for other purposes; ask your health care provider or pharmacist if you have questions. COMMON BRAND NAME(S): Alph-E-Mixed What should I tell my health care provider before I take this medicine? They need to know if you have any of the following conditions: -anemia -bleeding problems -history of stoke -low vitamin K levels in the body -recent surgery -an unusual or allergic reaction to vitamin E, other medicines, foods, dyes, or preservatives -pregnant or trying to get pregnant -breast-feeding How should I use this medicine? Take this medicine by mouth with a glass of water. Follow the directions on the package label. For best results take this vitamin with food. Take your vitamin at regular intervals. Do not take your vitamin more often than directed. Talk to your pediatrician regarding the use of this medicine in children. Special care may be needed. Overdosage: If you think you have taken too much of this medicine contact a poison control center or emergency room at once. NOTE: This medicine is only for you. Do not share this medicine with others. What if I miss a dose? If you miss a dose, take it as soon as you can. If it is almost time for your next dose, take only that dose. Do not take double or extra doses. What may interact with this medicine? -cholestyramine -mineral oil -orlistat -warfarin This list may not describe all possible interactions. Give your health care provider a list of all the medicines, herbs, non-prescription drugs, or dietary supplements you use. Also tell them if you smoke, drink alcohol, or use illegal drugs. Some items may interact with your  medicine. What should I watch for while using this medicine? Follow a good diet. Taking a vitamin supplement does not replace the need for a balanced diet. Some foods that have this vitamin naturally are cereal grains, fruits, green leafy vegetables, vegetable oils, and wheat germ oil. Too much of this vitamin can be unsafe. Talk to your doctor or health care provider about how much is right for you. If you are scheduled for any medical or dental procedure, tell your healthcare provider that you are taking this vitamin. You may need to stop taking it before the procedure. What side effects may I notice from receiving this medicine? Side effects that you should report to your doctor or health care professional as soon as possible: -allergic reactions like skin rash, itching or hives, swelling of the face, lips, or tongue -changes in vision -dizzy with headache -unusual bleeding or bruising -unusually weak or tired Side effects that usually do not require medical attention (report to your doctor or health care professional if they continue or are bothersome): -nausea -stomach upset or gas This list may not describe all possible side effects. Call your doctor for medical advice about side effects. You may report side effects to FDA at 1-800-FDA-1088. Where should I keep my medicine? Keep out of the reach of children. Store at room temperature between 15 and 30 degrees C (59 and 85 degrees F). Protect from heat and light. Throw away any unused medicine after the expiration date. NOTE: This sheet is a summary. It may not cover all possible information.  If you have questions about this medicine, talk to your doctor, pharmacist, or health care provider.  2017 Elsevier/Gold Standard (2015-11-23 12:50:42)

## 2016-11-05 NOTE — Progress Notes (Signed)
Pt is here with c/o lump in right breast that is sore.

## 2016-11-05 NOTE — Progress Notes (Signed)
GYN ENCOUNTER NOTE  Subjective:       Lori Mcgee is a 31 y.o. 283P0 female is here for evaluation of right breast lump.   Yesterday, Lori Mcgee's significant other felt a small round pea sized lump in her right breast that is sore like a "pulled muscle".   She reports increased intake of caffeine (soda and coffee) and chocolate as well as decreased water intake.   Family history includes a paternal aunt with breast cancer and a paternal grandmother with ovarian or cervical cancer.   Last Thursday, 10/31/2016, the patient had her cooper IUD removed as an inpatient procedure. Prior to the removal of an IUD a NuvaRing was inserted in office.    Gynecologic History Patient's last menstrual period was 10/23/2016 (approximate). Contraception: none   Obstetric History OB History  Gravida Para Term Preterm AB Living  3         3  SAB TAB Ectopic Multiple Live Births          3    # Outcome Date GA Lbr Len/2nd Weight Sex Delivery Anes PTL Lv  3 Gravida 2015    M Vag-Spont   LIV  2 Gravida 2012    F Vag-Spont   LIV  1 Gravida 2010    M Vag-Spont   LIV      Past Medical History:  Diagnosis Date  . BV (bacterial vaginosis)     Past Surgical History:  Procedure Laterality Date  . HYSTEROSCOPY N/A 10/31/2016   Procedure: HYSTEROSCOPY WITH IUD REMOVAL;  Surgeon: Hildred LaserAnika Cherry, MD;  Location: [facility] ORS;  Service: Gynecology;  Laterality: N/A;  . iud extraction  10/31/2016  . WISDOM TOOTH EXTRACTION      Current Outpatient Prescriptions on File Prior to Visit  Medication Sig Dispense Refill  . ibuprofen (ADVIL,MOTRIN) 800 MG tablet Take 1 tablet (800 mg total) by mouth every 8 (eight) hours as needed. 60 tablet 1   No current facility-administered medications on file prior to visit.     No Known Allergies  Social History   Social History  . Marital status: Married    Spouse name: N/A  . Number of children: N/A  . Years of education: N/A   Occupational History  . Not on  file.   Social History Main Topics  . Smoking status: Never Smoker  . Smokeless tobacco: Never Used  . Alcohol use No  . Drug use: No  . Sexual activity: Yes    Birth control/ protection: Inserts, None   Other Topics Concern  . Not on file   Social History Narrative  . No narrative on file    Family History  Problem Relation Age of Onset  . Diabetes Father   . Hypertension Father   . Thyroid disease Mother     The following portions of the patient's history were reviewed and updated as appropriate: allergies, current medications, past family history, past medical history, past social history, past surgical history and problem list.  Review of Systems Review of Systems - negative except for as noted above  Objective:   BP 117/69   Pulse 68   Ht 5' 9.5" (1.765 m)   Wt 166 lb 9 oz (75.6 kg)   LMP 10/23/2016 (Approximate)   BMI 24.24 kg/m    CONSTITUTIONAL: Well-developed female in no acute distress.   Breasts:   left breast normal without mass, skin or nipple changes or axillary nodes  pea sized round mobile solid breast mass noted  at 11 o'clock on right breast, no axillary nodes   Assessment:   1. Breast lump on right side at 11 o'clock position      Plan:  1. Risk and benefits of self breast exams reviewed and types of breast tissue discussed  2. Decreased caffeine and chocolate. Increase water intake. May start OTC Vitamin E supplement.   3. Monitor right breast weekly for changes in size or pain associated with lump. If size or pain increase, then return in one month for [place].   Gunnar Bulla, CNM

## 2016-11-06 ENCOUNTER — Encounter: Payer: Self-pay | Admitting: Certified Nurse Midwife

## 2016-11-06 ENCOUNTER — Other Ambulatory Visit: Payer: Self-pay | Admitting: Obstetrics and Gynecology

## 2016-11-06 DIAGNOSIS — N6310 Unspecified lump in the right breast, unspecified quadrant: Secondary | ICD-10-CM

## 2016-11-14 ENCOUNTER — Ambulatory Visit (INDEPENDENT_AMBULATORY_CARE_PROVIDER_SITE_OTHER): Payer: 59 | Admitting: Obstetrics and Gynecology

## 2016-11-14 ENCOUNTER — Encounter: Payer: Self-pay | Admitting: Obstetrics and Gynecology

## 2016-11-14 VITALS — BP 117/66 | HR 64 | Wt 171.1 lb

## 2016-11-14 DIAGNOSIS — Z9889 Other specified postprocedural states: Secondary | ICD-10-CM

## 2016-11-14 NOTE — Progress Notes (Signed)
    Subjective:     Lori BeachRachel L Vanhorn is a 31 y.o. female who presents to the clinic 2 weeks status post laparoscopic assisted vaginal hysterectomy and diagnostic hysteroscopy for IUD retrieval. Eating a regular diet without difficulty. Bowel movements are normal. The patient is not having any pain.  The following portions of the patient's history were reviewed and updated as appropriate: allergies, current medications, past family history, past medical history, past social history, past surgical history and problem list.  Review of Systems Pertinent items noted in HPI and remainder of comprehensive ROS otherwise negative.    Objective:    BP 117/66 (BP Location: Left Arm, Patient Position: Sitting, Cuff Size: Normal)   Pulse 64   Wt 171 lb 1.6 oz (77.6 kg)   LMP 10/23/2016 (Approximate)   BMI 24.90 kg/m  General:  alert and no distress  Abdomen: soft, bowel sounds active, non-tender      Assessment:    Doing well postoperatively.   Plan:   1. Activity restrictions: none 2. Anticipated return to work: patient has already returned to work. 3. Discussed contraception, patient will do family planning method.   4. Follow up: as needed    Hildred LaserAnika Kaoir Loree, MD [facility]

## 2016-11-19 ENCOUNTER — Other Ambulatory Visit: Payer: Self-pay | Admitting: Obstetrics and Gynecology

## 2016-11-19 ENCOUNTER — Ambulatory Visit
Admission: RE | Admit: 2016-11-19 | Discharge: 2016-11-19 | Disposition: A | Discharge status: Home / Self Care | Payer: 59 | Source: Ambulatory Visit | Attending: Obstetrics and Gynecology | Admitting: Obstetrics and Gynecology

## 2016-11-19 DIAGNOSIS — N6310 Unspecified lump in the right breast, unspecified quadrant: Secondary | ICD-10-CM

## 2016-11-19 DIAGNOSIS — N6321 Unspecified lump in the left breast, upper outer quadrant: Secondary | ICD-10-CM | POA: Diagnosis not present

## 2016-11-19 DIAGNOSIS — N6002 Solitary cyst of left breast: Secondary | ICD-10-CM | POA: Diagnosis not present

## 2016-11-19 DIAGNOSIS — N6011 Diffuse cystic mastopathy of right breast: Secondary | ICD-10-CM | POA: Diagnosis not present

## 2017-02-09 ENCOUNTER — Ambulatory Visit
Admission: EM | Admit: 2017-02-09 | Discharge: 2017-02-09 | Disposition: A | Discharge status: Home / Self Care | Payer: 59 | Attending: Emergency Medicine | Admitting: Emergency Medicine

## 2017-02-09 ENCOUNTER — Encounter: Payer: Self-pay | Admitting: Gynecology

## 2017-02-09 DIAGNOSIS — R300 Dysuria: Secondary | ICD-10-CM

## 2017-02-09 DIAGNOSIS — R109 Unspecified abdominal pain: Secondary | ICD-10-CM | POA: Diagnosis not present

## 2017-02-09 DIAGNOSIS — M5489 Other dorsalgia: Secondary | ICD-10-CM

## 2017-02-09 DIAGNOSIS — R319 Hematuria, unspecified: Secondary | ICD-10-CM

## 2017-02-09 LAB — URINALYSIS, COMPLETE (UACMP) WITH MICROSCOPIC
Bilirubin Urine: NEGATIVE
Glucose, UA: NEGATIVE mg/dL
Ketones, ur: NEGATIVE mg/dL
Nitrite: POSITIVE — AB
Protein, ur: 100 mg/dL — AB
Specific Gravity, Urine: 1.025 (ref 1.005–1.030)
pH: 6.5 (ref 5.0–8.0)

## 2017-02-09 MED ORDER — PHENAZOPYRIDINE HCL 200 MG PO TABS: 200.0000 mg | ORAL_TABLET | Freq: Three times a day (TID) | ORAL | 0 refills | Status: DC | PRN

## 2017-02-09 MED ORDER — NITROFURANTOIN MONOHYD MACRO 100 MG PO CAPS: 100.0000 mg | ORAL_CAPSULE | Freq: Two times a day (BID) | ORAL | 0 refills | Status: AC

## 2017-02-09 NOTE — Discharge Instructions (Addendum)
Recommend start Macrobid twice a day as directed. May use Pyridium up to 3 times a day as needed for pain. Will cause orange discoloration of urine. Increase fluid intake. Follow-up pending urine culture results or go to ER if symptoms worsen within 48 hours.

## 2017-02-09 NOTE — ED Provider Notes (Signed)
CSN: [id number]     Arrival date & time 02/09/17  0805 History   First MD Initiated Contact with Patient 02/09/17 [phone]     Chief Complaint  Patient presents with  . Urinary Tract Infection   (Consider location/radiation/quality/duration/timing/severity/associated sxs/prior Treatment) 31 year old female presents with urinary burning, discomfort, frequency and urgency that started last evening. Denies any fever, nausea, vomiting or unusual vaginal discharge. Thinks she saw blood in her urine this morning and now having slight lower back pain. Last UTI over 10 years ago. Takes no daily medication. No chronic health issues.    The history is provided by the patient.    History reviewed. No pertinent past medical history. History reviewed. No pertinent surgical history. No family history on file. Social History  Substance Use Topics  . Smoking status: Never Smoker  . Smokeless tobacco: Never Used  . Alcohol use No   OB History    No data available     Review of Systems  Constitutional: Negative for appetite change, chills and fever.  Respiratory: Negative for cough, chest tightness and shortness of breath.   Cardiovascular: Negative for chest pain.  Gastrointestinal: Positive for abdominal pain. Negative for diarrhea, nausea and vomiting.  Genitourinary: Positive for decreased urine volume, dysuria, flank pain, frequency, hematuria and urgency. Negative for pelvic pain, vaginal bleeding and vaginal discharge.  Musculoskeletal: Positive for back pain. Negative for arthralgias, myalgias, neck pain and neck stiffness.  Skin: Negative for rash and wound.  Allergic/Immunologic: Negative for immunocompromised state.  Neurological: Negative for dizziness, syncope, weakness, light-headedness, numbness and headaches.  Hematological: Negative for adenopathy.    Allergies  Patient has no known allergies.  Home Medications   Prior to Admission medications   Medication Sig Start Date End  Date Taking? Authorizing Provider  nitrofurantoin, macrocrystal-monohydrate, (MACROBID) 100 MG capsule Take 1 capsule (100 mg total) by mouth 2 (two) times daily. 02/09/17 02/14/17  Sudie Grumbling, NP  phenazopyridine (PYRIDIUM) 200 MG tablet Take 1 tablet (200 mg total) by mouth 3 (three) times daily as needed for pain. 02/09/17   Sudie Grumbling, NP   Meds Ordered and Administered this Visit  Medications - No data to display  BP 103/66 (BP Location: Left Arm)   Pulse 72   Temp 98.8 F (37.1 C) (Oral)   Resp 16   Ht  (1.753 m)   Wt 168 lb (76.2 kg)   LMP 02/02/2017   SpO2 100%   BMI 24.81 kg/m  No data found.   Physical Exam  Constitutional: She is oriented to person, place, and time. She appears well-developed and well-nourished. No distress.  HENT:  Head: Normocephalic and atraumatic.  Neck: Normal range of motion. Neck supple.  Cardiovascular: Normal rate, regular rhythm and normal heart sounds.   No murmur heard. Pulmonary/Chest: Effort normal and breath sounds normal. No respiratory distress.  Abdominal: Soft. Normal appearance and bowel sounds are normal. There is no hepatosplenomegaly. There is tenderness in the suprapubic area. There is no rigidity, no rebound, no guarding and no CVA tenderness.  Musculoskeletal: Normal range of motion.  Lymphadenopathy:    She has no cervical adenopathy.  Neurological: She is alert and oriented to person, place, and time.  Skin: Skin is warm and dry.  Psychiatric: She has a normal mood and affect. Her behavior is normal. Judgment and thought content normal.    Urgent Care Course     Procedures (including critical care time)  Labs Review Labs Reviewed  URINALYSIS, COMPLETE (UACMP) WITH MICROSCOPIC - Abnormal; Notable for the following:       Result Value   Color, Urine BROWN (*)    APPearance CLOUDY (*)    Hgb urine dipstick LARGE (*)    Protein, ur 100 (*)    Nitrite POSITIVE (*)    Leukocytes, UA LARGE (*)    Squamous  Epithelial / LPF 0-5 (*)    Bacteria, UA MANY (*)    All other components within normal limits  URINE CULTURE    Imaging Review No results found.   Visual Acuity Review  Right Eye Distance:   Left Eye Distance:   Bilateral Distance:    Right Eye Near:   Left Eye Near:    Bilateral Near:         MDM   1. Dysuria    Reviewed urinalysis results with patient- urine sent for culture. Recommend start Macrobid twice a day as directed. May use Pyridium up to 3 times a day as needed for pain. Will cause orange discoloration of urine. Increase fluid intake. Follow-up pending urine culture results or go to ER if symptoms worsen within 48 hours.     Sudie Grumbling, NP 02/09/17 1154

## 2017-02-09 NOTE — ED Triage Notes (Signed)
Patient c/o urge and painful urination x last pm.

## 2017-02-12 ENCOUNTER — Telehealth: Payer: Self-pay | Admitting: Obstetrics and Gynecology

## 2017-02-12 NOTE — Telephone Encounter (Signed)
Spoke with pt we discussed her UTI sx, she is going to watch the bleeding and let me know

## 2017-02-12 NOTE — Telephone Encounter (Signed)
Patient states that she went to the urgent care on Sunday for a UTI, but today she started bleeding pretty heavy. LMP was 01/31/17. She's not sure what is going on or what she should do. Please advise.

## 2017-02-13 LAB — URINE CULTURE: Special Requests: NORMAL

## 2017-05-21 ENCOUNTER — Telehealth: Payer: Self-pay | Admitting: Obstetrics and Gynecology

## 2017-05-21 NOTE — Telephone Encounter (Signed)
Patient called stating that the [facility] called stating that the patient needs a mammogram but there is not an order in, The patient is requesting to have an order put in so sh will will be able to get that mammogram done soon, Patient did not disclose another information, and would like a call when the order has been placed. Please advise.

## 2017-05-21 NOTE — Telephone Encounter (Signed)
mns pt will route to ac.

## 2017-05-22 ENCOUNTER — Other Ambulatory Visit: Payer: Self-pay | Admitting: *Deleted

## 2017-05-22 DIAGNOSIS — J029 Acute pharyngitis, unspecified: Secondary | ICD-10-CM | POA: Diagnosis not present

## 2017-05-22 DIAGNOSIS — R070 Pain in throat: Secondary | ICD-10-CM | POA: Diagnosis not present

## 2017-05-26 ENCOUNTER — Other Ambulatory Visit: Payer: Self-pay | Admitting: *Deleted

## 2017-05-26 DIAGNOSIS — N6002 Solitary cyst of left breast: Secondary | ICD-10-CM

## 2017-05-26 NOTE — Telephone Encounter (Signed)
Done-ac 

## 2017-05-29 ENCOUNTER — Encounter: Payer: Self-pay | Admitting: Obstetrics and Gynecology

## 2017-05-29 ENCOUNTER — Other Ambulatory Visit: Payer: Self-pay | Admitting: Obstetrics and Gynecology

## 2017-05-29 ENCOUNTER — Ambulatory Visit (INDEPENDENT_AMBULATORY_CARE_PROVIDER_SITE_OTHER): Payer: 59 | Admitting: Obstetrics and Gynecology

## 2017-05-29 VITALS — BP 111/70 | HR 57 | Ht 69.5 in | Wt 170.7 lb

## 2017-05-29 DIAGNOSIS — Z01419 Encounter for gynecological examination (general) (routine) without abnormal findings: Secondary | ICD-10-CM

## 2017-05-29 DIAGNOSIS — N6011 Diffuse cystic mastopathy of right breast: Secondary | ICD-10-CM | POA: Diagnosis not present

## 2017-05-29 DIAGNOSIS — N6012 Diffuse cystic mastopathy of left breast: Secondary | ICD-10-CM | POA: Diagnosis not present

## 2017-05-29 NOTE — Patient Instructions (Signed)
Preventive Care 18-39 Years, Female Preventive care refers to lifestyle choices and visits with your health care provider that can promote health and wellness. What does preventive care include?  A yearly physical exam. This is also called an annual well check.  Dental exams once or twice a year.  Routine eye exams. Ask your health care provider how often you should have your eyes checked.  Personal lifestyle choices, including: ? Daily care of your teeth and gums. ? Regular physical activity. ? Eating a healthy diet. ? Avoiding tobacco and drug use. ? Limiting alcohol use. ? Practicing safe sex. ? Taking vitamin and mineral supplements as recommended by your health care provider. What happens during an annual well check? The services and screenings done by your health care provider during your annual well check will depend on your age, overall health, lifestyle risk factors, and family history of disease. Counseling Your health care provider may ask you questions about your:  Alcohol use.  Tobacco use.  Drug use.  Emotional well-being.  Home and relationship well-being.  Sexual activity.  Eating habits.  Work and work Statistician.  Method of birth control.  Menstrual cycle.  Pregnancy history.  Screening You may have the following tests or measurements:  Height, weight, and BMI.  Diabetes screening. This is done by checking your blood sugar (glucose) after you have not eaten for a while (fasting).  Blood pressure.  Lipid and cholesterol levels. These may be checked every 5 years starting at age 38.  Skin check.  Hepatitis C blood test.  Hepatitis B blood test.  Sexually transmitted disease (STD) testing.  BRCA-related cancer screening. This may be done if you have a family history of breast, ovarian, tubal, or peritoneal cancers.  Pelvic exam and Pap test. This may be done every 3 years starting at age 38. Starting at age 30, this may be done  every 5 years if you have a Pap test in combination with an HPV test.  Discuss your test results, treatment options, and if necessary, the need for more tests with your health care provider. Vaccines Your health care provider may recommend certain vaccines, such as:  Influenza vaccine. This is recommended every year.  Tetanus, diphtheria, and acellular pertussis (Tdap, Td) vaccine. You may need a Td booster every 10 years.  Varicella vaccine. You may need this if you have not been vaccinated.  HPV vaccine. If you are 39 or younger, you may need three doses over 6 months.  Measles, mumps, and rubella (MMR) vaccine. You may need at least one dose of MMR. You may also need a second dose.  Pneumococcal 13-valent conjugate (PCV13) vaccine. You may need this if you have certain conditions and were not previously vaccinated.  Pneumococcal polysaccharide (PPSV23) vaccine. You may need one or two doses if you smoke cigarettes or if you have certain conditions.  Meningococcal vaccine. One dose is recommended if you are age 68-21 years and a first-year college student living in a residence hall, or if you have one of several medical conditions. You may also need additional booster doses.  Hepatitis A vaccine. You may need this if you have certain conditions or if you travel or work in places where you may be exposed to hepatitis A.  Hepatitis B vaccine. You may need this if you have certain conditions or if you travel or work in places where you may be exposed to hepatitis B.  Haemophilus influenzae type b (Hib) vaccine. You may need this  if you have certain risk factors.  Talk to your health care provider about which screenings and vaccines you need and how often you need them. This information is not intended to replace advice given to you by your health care provider. Make sure you discuss any questions you have with your health care provider. Document Released: 12/17/2001 Document Revised:  07/10/2016 Document Reviewed: 08/22/2015 Elsevier Interactive Patient Education  2017 Elsevier Inc.  

## 2017-05-29 NOTE — Progress Notes (Signed)
Subjective:   Lori Mcgee is a 31 y.o. 303P0003 Caucasian female here for a routine well-woman exam.  Patient's last menstrual period was 05/23/2017.    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  Past Medical History Past Medical History:  Diagnosis Date  . BV (bacterial vaginosis)     Past Surgical History Past Surgical History:  Procedure Laterality Date  . HYSTEROSCOPY N/A 10/31/2016   Procedure: HYSTEROSCOPY WITH IUD REMOVAL;  Surgeon: Hildred LaserAnika Cherry, MD;  Location: [facility] ORS;  Service: Gynecology;  Laterality: N/A;  . iud extraction  10/31/2016  . WISDOM TOOTH EXTRACTION      Gynecologic History G3P0003  Patient's last menstrual period was 05/23/2017. Contraception: coitus interruptus Last Pap: 2016. Results were: normal Last mammogram: 2017. Results were: abnormal   Obstetric History OB History  Gravida Para Term Preterm AB Living  3 0 0 0 0 3  SAB TAB Ectopic Multiple Live Births  0 0 0   3    # Outcome Date GA Lbr Len/2nd Weight Sex Delivery Anes PTL Lv  3 Gravida 2015    M Vag-Spont   LIV  2 Gravida 2012    F Vag-Spont   LIV  1 Gravida 2010    M Vag-Spont   LIV      Current Medications Current Outpatient Prescriptions on File Prior to Visit  Medication Sig Dispense Refill  . ibuprofen (ADVIL,MOTRIN) 800 MG tablet Take 1 tablet (800 mg total) by mouth every 8 (eight) hours as needed. (Patient not taking: Reported on 05/29/2017) 60 tablet 1  .  phenazopyridine (PYRIDIUM) 200 MG tablet Take 1 tablet (200 mg total) by mouth 3 (three) times daily as needed for pain. (Patient not taking: Reported on 05/29/2017) 9 tablet 0   No current facility-administered medications on file prior to visit.     Review of Systems Patient denies any headaches, blurred vision, shortness of breath, chest pain, abdominal pain, problems with bowel movements, urination, or intercourse.  Objective:  BP 111/70   Pulse (!) 57   Ht 5' 9.5" (1.765 m)   Wt 170 lb 11.2 oz (77.4 kg)   LMP 05/23/2017   BMI 24.85 kg/m  Physical Exam  General:  Well developed, well nourished, no acute distress. She is alert and oriented x3. Skin:  Warm and dry Neck:  Midline trachea, no thyromegaly or nodules Cardiovascular: Regular rate and rhythm, no murmur heard Lungs:  Effort normal, all lung fields clear to auscultation bilaterally Breasts:  No dominant palpable mass, retraction, or nipple discharge, fibrocystic tissue noted in upper outer left breast Abdomen:  Soft, non tender, no hepatosplenomegaly or masses Pelvic:  External genitalia is normal in appearance.  The vagina is normal in appearance. The cervix is bulbous, no CMT.  Thin prep pap is done with HR HPV cotesting. Uterus is felt to be normal size, shape, and contour.  No adnexal masses or tenderness noted. Extremities:  No swelling or varicosities noted Psych:  She has a normal mood and affect  Assessment:   Healthy well-woman exam Left breast  cyst  Plan:   F/U 1 year for AE, or sooner if needed Mammogram f/u scan orderd  Melody Suzan NailerN Shambley, CNM

## 2017-05-30 LAB — HM PAP SMEAR: HM Pap smear: NORMAL

## 2017-05-30 LAB — CYTOLOGY - PAP

## 2017-06-05 ENCOUNTER — Encounter: Payer: Self-pay | Admitting: *Deleted

## 2017-06-17 ENCOUNTER — Encounter (INDEPENDENT_AMBULATORY_CARE_PROVIDER_SITE_OTHER): Payer: 59

## 2017-07-12 ENCOUNTER — Encounter: Payer: Self-pay | Admitting: Gynecology

## 2017-07-12 ENCOUNTER — Ambulatory Visit (INDEPENDENT_AMBULATORY_CARE_PROVIDER_SITE_OTHER): Payer: 59

## 2017-07-12 ENCOUNTER — Ambulatory Visit
Admission: EM | Admit: 2017-07-12 | Discharge: 2017-07-12 | Disposition: A | Discharge status: Home / Self Care | Payer: 59 | Attending: Family Medicine | Admitting: Family Medicine

## 2017-07-12 DIAGNOSIS — Z9889 Other specified postprocedural states: Secondary | ICD-10-CM | POA: Insufficient documentation

## 2017-07-12 DIAGNOSIS — M940 Chondrocostal junction syndrome [Tietze]: Secondary | ICD-10-CM | POA: Diagnosis not present

## 2017-07-12 DIAGNOSIS — R079 Chest pain, unspecified: Secondary | ICD-10-CM | POA: Diagnosis not present

## 2017-07-12 DIAGNOSIS — Z791 Long term (current) use of non-steroidal anti-inflammatories (NSAID): Secondary | ICD-10-CM | POA: Diagnosis not present

## 2017-07-12 DIAGNOSIS — Z833 Family history of diabetes mellitus: Secondary | ICD-10-CM | POA: Insufficient documentation

## 2017-07-12 DIAGNOSIS — Z803 Family history of malignant neoplasm of breast: Secondary | ICD-10-CM | POA: Diagnosis not present

## 2017-07-12 DIAGNOSIS — R0789 Other chest pain: Secondary | ICD-10-CM | POA: Diagnosis not present

## 2017-07-12 DIAGNOSIS — Z8249 Family history of ischemic heart disease and other diseases of the circulatory system: Secondary | ICD-10-CM | POA: Diagnosis not present

## 2017-07-12 DIAGNOSIS — M546 Pain in thoracic spine: Secondary | ICD-10-CM

## 2017-07-12 DIAGNOSIS — Z79899 Other long term (current) drug therapy: Secondary | ICD-10-CM | POA: Diagnosis not present

## 2017-07-12 MED ORDER — MELOXICAM 15 MG PO TABS: 15.0000 mg | ORAL_TABLET | Freq: Every day | ORAL | 0 refills | Status: DC

## 2017-07-12 MED ORDER — KETOROLAC TROMETHAMINE 60 MG/2ML IM SOLN
60.0000 mg | Freq: Once | INTRAMUSCULAR | Status: AC
  Administered 2017-07-12: 60 mg via INTRAMUSCULAR

## 2017-07-12 NOTE — ED Provider Notes (Signed)
[facility]    CSN: [id number] Arrival date & time: 07/12/17  1431     History   Chief Complaint Chief Complaint  Patient presents with  . Pleurisy  . Back Pain    HPI Lori Mcgee is a 31 y.o. female.   A shunt is a 31 year old white female with left-sided rib and back pain that she states started on Thursday. The pain has progressed and continued to increase. She was on the soccer field 2 day with her 3 children going to the games when the pain continued to get worSE. The pain is going from the left back to the left chest and progressively getting worse. There is discomfort when she takes a deep breath or when she moves. She's had an IUD removed. She's had was extracted. She is a gravida 3 para 3 AB 0 habits she does not smoke. No pertinent family medical history relevant to today's visit. But there is a history of hypertension diabetes in the family but no history of sudden death.   The history is provided by the patient. No language interpreter was used.  Back Pain  Location:  Thoracic spine Quality:  Shooting Radiates to: The pain moves around the left lower ribs to the anterior lower ribs and also along both sides of the sternum. Pain severity:  Severe Timing:  Constant Progression:  Worsening Chronicity:  New Context: not emotional stress, not falling, not jumping from heights, not lifting heavy objects, not MCA, not MVA, not occupational injury, not pedestrian accident, not physical stress, not recent illness and not recent injury   Associated symptoms: chest pain     Past Medical History:  Diagnosis Date  . BV (bacterial vaginosis)     There are no active problems to display for this patient.   Past Surgical History:  Procedure Laterality Date  . HYSTEROSCOPY N/A 10/31/2016   Procedure: HYSTEROSCOPY WITH IUD REMOVAL;  Surgeon: Hildred Laser, MD;  Location: [facility] ORS;  Service: Gynecology;  Laterality: N/A;  . iud extraction  10/31/2016  . WISDOM  TOOTH EXTRACTION      OB History    Gravida Para Term Preterm AB Living   3 0 0 0 0 3   SAB TAB Ectopic Multiple Live Births   0 0 0   3       Home Medications    Prior to Admission medications   Medication Sig Start Date End Date Taking? Authorizing Provider  ibuprofen (ADVIL,MOTRIN) 800 MG tablet Take 1 tablet (800 mg total) by mouth every 8 (eight) hours as needed. Patient not taking: Reported on 05/29/2017 10/31/16   Hildred Laser, MD  meloxicam (MOBIC) 15 MG tablet Take 1 tablet (15 mg total) by mouth daily. 07/12/17   Hassan Rowan, MD  phenazopyridine (PYRIDIUM) 200 MG tablet Take 1 tablet (200 mg total) by mouth 3 (three) times daily as needed for pain. Patient not taking: Reported on 05/29/2017 02/09/17   Sudie Grumbling, NP    Family History Family History  Problem Relation Age of Onset  . Diabetes Father   . Hypertension Father   . Thyroid disease Mother   . Breast cancer Paternal Aunt        late 2's or early 6's    Social History Social History  Substance Use Topics  . Smoking status: Never Smoker  . Smokeless tobacco: Never Used  . Alcohol use No     Allergies   Patient has no known allergies.  Review of Systems Review of Systems  Cardiovascular: Positive for chest pain.  Musculoskeletal: Positive for back pain.  All other systems reviewed and are negative.    Physical Exam Triage Vital Signs ED Triage Vitals  Enc Vitals Group     BP 07/12/17 1445 106/67     Pulse Rate 07/12/17 1445 66     Resp 07/12/17 1445 16     Temp 07/12/17 1445 98.2 F (36.8 C)     Temp Source 07/12/17 1445 Oral     SpO2 07/12/17 1445 100 %     Weight 07/12/17 1442 168 lb (76.2 kg)     Height 07/12/17 1442 5\' 10"  (1.778 m)     Head Circumference --      Peak Flow --      Pain Score 07/12/17 1442 6     Pain Loc --      Pain Edu? --      Excl. in GC? --    No data found.   Updated Vital Signs BP 106/67 (BP Location: Left Arm)   Pulse 66   Temp 98.2 F  (36.8 C) (Oral)   Resp 16   Ht 5\' 10"  (1.778 m)   Wt 168 lb (76.2 kg)   LMP 06/11/2017   SpO2 100%   BMI 24.11 kg/m   Visual Acuity Right Eye Distance:   Left Eye Distance:   Bilateral Distance:    Right Eye Near:   Left Eye Near:    Bilateral Near:     Physical Exam  Constitutional: She is oriented to person, place, and time. She appears well-developed and well-nourished.  HENT:  Head: Normocephalic and atraumatic.  Right Ear: External ear normal.  Left Ear: External ear normal.  Mouth/Throat: Oropharynx is clear and moist.  Eyes: Pupils are equal, round, and reactive to light. EOM are normal.  Neck: Normal range of motion. Neck supple.  Cardiovascular: Normal rate, regular rhythm and normal heart sounds.   Pulmonary/Chest: Effort normal and breath sounds normal.     She exhibits tenderness.    Ankles the left back along her left lower ribs into her sternum on both sides. But the pain when she does have it starts from the back and seems to move that leg. differential is breath and difficult to breathing but pain with deep breathing  Abdominal: Soft.  Musculoskeletal: She exhibits tenderness.  Lymphadenopathy:    She has no cervical adenopathy.  Neurological: She is alert and oriented to person, place, and time.  Skin: Skin is warm.  Psychiatric: She has a normal mood and affect.  Vitals reviewed.    UC Treatments / Results  Labs (all labs ordered are listed, but only abnormal results are displayed) Labs Reviewed - No data to display  EKG  EKG Interpretation None         ED ECG REPORT I, Aayana Reinertsen H, the attending physician, personally viewed and interpreted this ECG.   Date: 07/12/2017  EKG Time:14:54:51  Rate:61  Rhythm: normal EKG, normal sinus rhythm, there are no previous tracings available for comparison  Axis: 54  Intervals:none  ST&T Change: NEGATIVE Radiology Dg Chest 2 View  Result Date: 07/12/2017 CLINICAL DATA:  Pt with left  lower chest pain that radiates toward the left back x two days. No hx of trauma, illness or surgery. EXAM: CHEST  2 VIEW COMPARISON:  Chest x-ray dated 02/01/2015. FINDINGS: Cardiomediastinal silhouette is normal in size and configuration. Lungs are clear. Lung volumes are normal.  No evidence of pneumonia. No pleural effusion. No pneumothorax. Osseous and soft tissue structures about the chest are unremarkable. IMPRESSION: Normal chest x-ray. Electronically Signed   By: Bary Richard M.D.   On: 07/12/2017 15:53    Procedures Procedures (including critical care time)  Medications Ordered in UC Medications  ketorolac (TORADOL) injection 60 mg (60 mg Intramuscular Given 07/12/17 1554)     Initial Impression / Assessment and Plan / UC Course  I have reviewed the triage vital signs and the nursing notes.  Pertinent labs & imaging results that were available during my care of the patient were reviewed by me and considered in my medical decision making (see chart for details).   patient x-ray was negative EKG was normal will treat with Mobic 15 mg 1 tablet day have patient follow-up with PCP in 2-3 weeks if needed.   Final Clinical Impressions(s) / UC Diagnoses   Final diagnoses:  Costochondritis  Acute left-sided thoracic back pain    New Prescriptions New Prescriptions   MELOXICAM (MOBIC) 15 MG TABLET    Take 1 tablet (15 mg total) by mouth daily.    Note: This dictation was prepared with Dragon dictation along with smaller phrase technology. Any transcriptional errors that result from this process are unintentional.Controlled Substance Prescriptions [place] Controlled Substance Registry consulted? Not Applicable   Hassan Rowan, MD 07/12/17 [phone]

## 2017-07-12 NOTE — ED Triage Notes (Signed)
Patient c/o left chest pain that rotates to her back x 3 days. Per patient sharp , stabbing pain.

## 2017-07-13 ENCOUNTER — Encounter: Payer: Self-pay | Admitting: Obstetrics and Gynecology

## 2017-07-13 ENCOUNTER — Telehealth: Payer: 59 | Admitting: Family

## 2017-07-13 DIAGNOSIS — M546 Pain in thoracic spine: Secondary | ICD-10-CM

## 2017-07-13 NOTE — Progress Notes (Signed)
Based on what you shared with me it looks like you have a serious condition that should be evaluated in a face to face office visit.  NOTE: Even if you have entered your credit card information for this eVisit, you will not be charged.   If you are having a true medical emergency please call 911.  If you need an urgent face to face visit, [facility] [place] has four urgent care centers for your convenience.  If you need care fast and have a high deductible or no insurance consider:   [email or web address]  [phone]  [address], [address] [place], [place] [address] 8 am to 8 pm Monday-Friday 10 am to 4 pm Saturday-Sunday   The following sites will take your  insurance:    . [facility]  [phone] Get Driving Directions Find a Provider at this Location  [address] [place], [place] [address] . 10 am to 8 pm Monday-Friday . 12 pm to 8 pm Saturday-Sunday   . [facility]  [phone] Get Driving Directions Find a Provider at this Location  [address], [address] [place], [place] [address] . 8 am to 8 pm Monday-Friday . 9 am to 6 pm Saturday . 11 am to 6 pm Sunday   . [facility] [place]  [phone] Get Driving Directions  [address] [place], [place] [address] . 8 am to 8 pm Monday-Friday . 8 am to 4 pm Saturday-Sunday   Your e-visit answers were reviewed by a board certified advanced clinical practitioner to complete your personal care plan.  Thank you for using e-Visits.  

## 2017-09-01 ENCOUNTER — Encounter: Payer: Self-pay | Admitting: Obstetrics and Gynecology

## 2017-10-21 ENCOUNTER — Other Ambulatory Visit: Payer: Self-pay | Admitting: Obstetrics and Gynecology

## 2017-10-21 ENCOUNTER — Ambulatory Visit
Admission: RE | Admit: 2017-10-21 | Discharge: 2017-10-21 | Disposition: A | Discharge status: Home / Self Care | Payer: 59 | Source: Ambulatory Visit | Attending: Obstetrics and Gynecology | Admitting: Obstetrics and Gynecology

## 2017-10-21 DIAGNOSIS — R922 Inconclusive mammogram: Secondary | ICD-10-CM | POA: Diagnosis not present

## 2017-10-21 DIAGNOSIS — N6002 Solitary cyst of left breast: Secondary | ICD-10-CM

## 2017-10-21 DIAGNOSIS — N6321 Unspecified lump in the left breast, upper outer quadrant: Secondary | ICD-10-CM | POA: Diagnosis not present

## 2017-10-21 DIAGNOSIS — N6323 Unspecified lump in the left breast, lower outer quadrant: Secondary | ICD-10-CM | POA: Diagnosis not present

## 2017-11-27 ENCOUNTER — Encounter: Payer: Self-pay | Admitting: Primary Care

## 2017-11-27 ENCOUNTER — Ambulatory Visit (INDEPENDENT_AMBULATORY_CARE_PROVIDER_SITE_OTHER): Payer: No Typology Code available for payment source | Admitting: Primary Care

## 2017-11-27 VITALS — BP 110/76 | HR 61 | Temp 98.2°F | Ht 68.75 in | Wt 176.0 lb

## 2017-11-27 DIAGNOSIS — G8929 Other chronic pain: Secondary | ICD-10-CM | POA: Insufficient documentation

## 2017-11-27 DIAGNOSIS — M545 Low back pain: Secondary | ICD-10-CM

## 2017-11-27 DIAGNOSIS — L989 Disorder of the skin and subcutaneous tissue, unspecified: Secondary | ICD-10-CM

## 2017-11-27 DIAGNOSIS — R293 Abnormal posture: Secondary | ICD-10-CM | POA: Insufficient documentation

## 2017-11-27 NOTE — Assessment & Plan Note (Signed)
Doesn't appear suspicious. Discussed to try topical benzoyl peroxide. Referral placed to dermatology.

## 2017-11-27 NOTE — Assessment & Plan Note (Signed)
No pain today or recently. Continue chiropractic care as this has been very beneficial. Referral placed.

## 2017-11-27 NOTE — Progress Notes (Signed)
Subjective:    Patient ID: Lori Mcgee, female    DOB: 08-04-1986, 32 y.o.   MRN: [id number]  HPI  Lori Mcgee is a 32 year old female who presents today to establish care and discuss the problems mentioned below. Will obtain old records.  1) Lesion: Located to the left upper check for which she noted initially about 5 years ago. She'll "pop" the lesion and will see whitish puss from the spot, then the lesion will re-grow and she'll re-pop. She's not tried anything OTC for the lesion. She washes her face with a holistic face wash. She denies changes in make-up, soaps, detergents. She is requesting to see a dermatologist and is needing a referral.  2) Lower Back Pain and Poor posture: Currently following with a chiropractor (Dr. Manya Silvasumain[place] [facility] for whom she's been seeing for several years. She is needing a referral for the chiropractor due to insurance purposes. She's done much better since establishing with the chiropractor several years ago. She denies back pain.  Review of Systems  Musculoskeletal: Negative for back pain.  Skin: Negative for wound.       Lesion/pimple  Neurological: Negative for dizziness, weakness and headaches.       Past Medical History:  Diagnosis Date  . BV (bacterial vaginosis)      Social History   Socioeconomic History  . Marital status: Married    Spouse name: Not on file  . Number of children: Not on file  . Years of education: Not on file  . Highest education level: Not on file  Social Needs  . Financial resource strain: Not on file  . Food insecurity - worry: Not on file  . Food insecurity - inability: Not on file  . Transportation needs - medical: Not on file  . Transportation needs - non-medical: Not on file  Occupational History  . Not on file  Tobacco Use  . Smoking status: Never Smoker  . Smokeless tobacco: Never Used  Substance and Sexual Activity  . Alcohol use: No  . Drug use: No  . Sexual activity: Yes  Other Topics  Concern  . Not on file  Social History Narrative   Married.   3 children.   Works in [employer].    Enjoys exercising and playing basketball.      Past Surgical History:  Procedure Laterality Date  . HYSTEROSCOPY N/A 10/31/2016   Procedure: HYSTEROSCOPY WITH IUD REMOVAL;  Surgeon: Hildred LaserAnika Cherry, MD;  Location: [facility] ORS;  Service: Gynecology;  Laterality: N/A;  . iud extraction  10/31/2016  . WISDOM TOOTH EXTRACTION      Family History  Problem Relation Age of Onset  . Diabetes Father   . Hypertension Father   . Hypothyroidism Father   . Hypertension Mother   . Breast cancer Paternal Aunt        late 30's or early 40's    No Known Allergies  No current outpatient medications on file prior to visit.   No current facility-administered medications on file prior to visit.     BP 110/76   Pulse 61   Temp 98.2 F (36.8 C) (Oral)   Ht 5' 8.75" (1.746 m)   Wt 176 lb (79.8 kg)   LMP 11/05/2017   SpO2 98%   BMI 26.18 kg/m    Objective:   Physical Exam  Constitutional: She appears well-nourished.  Neck: Neck supple.  Cardiovascular: Normal rate and regular rhythm.  Pulmonary/Chest: Effort normal and breath  sounds normal.  Musculoskeletal:       Lumbar back: She exhibits normal range of motion, no tenderness and no pain.       Back:  Skin: Skin is warm and dry.  Pinpoint spot to left upper facial cheek, no drainage, slight brown color.  Psychiatric: She has a normal mood and affect.          Assessment & Plan:

## 2017-11-27 NOTE — Patient Instructions (Signed)
You will be contacted regarding your referral to Dermatology.  Please let us know if you have not been contacted within one week.   It was a pleasure to meet you today! Please don't hesitate to call or message me with any questions. Welcome to [facility]!

## 2017-11-27 NOTE — Assessment & Plan Note (Signed)
Good posture during visit today. Continue chiropractor visits given that she's been successful on her current regimen. Referral placed.

## 2017-12-19 ENCOUNTER — Ambulatory Visit (INDEPENDENT_AMBULATORY_CARE_PROVIDER_SITE_OTHER): Payer: No Typology Code available for payment source | Admitting: Primary Care

## 2017-12-19 ENCOUNTER — Encounter: Payer: Self-pay | Admitting: Primary Care

## 2017-12-19 VITALS — BP 110/64 | HR 53 | Temp 98.0°F | Ht 68.75 in | Wt 176.8 lb

## 2017-12-19 DIAGNOSIS — H938X2 Other specified disorders of left ear: Secondary | ICD-10-CM | POA: Diagnosis not present

## 2017-12-19 DIAGNOSIS — R635 Abnormal weight gain: Secondary | ICD-10-CM | POA: Diagnosis not present

## 2017-12-19 DIAGNOSIS — Z8349 Family history of other endocrine, nutritional and metabolic diseases: Secondary | ICD-10-CM | POA: Diagnosis not present

## 2017-12-19 LAB — TSH: TSH: 0.81 u[IU]/mL (ref 0.35–4.50)

## 2017-12-19 LAB — T4, FREE: Free T4: 0.93 ng/dL (ref 0.60–1.60)

## 2017-12-19 MED ORDER — FLUTICASONE PROPIONATE 50 MCG/ACT NA SUSP
1.0000 | Freq: Two times a day (BID) | NASAL | 0 refills | Status: DC | PRN
Start: 2017-12-19 — End: 2018-04-16

## 2017-12-19 NOTE — Patient Instructions (Addendum)
Stop by the lab prior to leaving today. I will notify you of your results once received.   Continue to work hard on your diet. Limit fast food, fried food.   Continue exercising. You should be getting 150 minutes of moderate intensity exercise weekly.  Ensure you are consuming 64 ounces of water daily.  Nasal Congestion/Ear Pressure: Try using Flonase (fluticasone) nasal spray. Instill 1 spray in each nostril twice daily.   It was a pleasure to see you today!

## 2017-12-19 NOTE — Progress Notes (Signed)
Subjective:    Patient ID: Lori Mcgee, female    DOB: Mar 29, 1986, 32 y.o.   MRN: [id number]  HPI  Lori Mcgee is a 32 year old female who presents today with a chief complaint of ear fullness. Her fullness is located to the left ear which has been present for the past several weeks. She did notice some pain several days ago to the posterior ear. She denies fevers, chills, cough, sore throat, rhinorrhea, nasal congestion.   2) Difficulty losing weight: Has been trying to lose weight for the past two months through improvements in diet and exercise and is frustrated at her lack of success. Her father has difficulty with metabolism and has "thyroid problems".   Her diet prior to two months ago was "horrbile" and she wasn't exercising. She denies palpitations, chest pain, hair loss.   Diet currently consists of:  Breakfast: Eggs Lunch: Protein, vegetables, occasional mashed potatoes Dinner: Protein, vegetables; eats out on the weekends fast food Snacks: Chips, bite of children's food Desserts: Several times monthly, cookies Beverages: Water, occasional coffee drink   Exercise: 5 days weekly, running, cardio   Review of Systems  Constitutional: Negative for fatigue.  HENT: Negative for congestion, ear pain, rhinorrhea and sinus pressure.        Ear fullness  Respiratory: Negative for cough and shortness of breath.   Cardiovascular: Negative for palpitations.  Skin:       Denies hair loss  Neurological: Negative for dizziness and headaches.       Past Medical History:  Diagnosis Date  . BV (bacterial vaginosis)      Social History   Socioeconomic History  . Marital status: Married    Spouse name: Not on file  . Number of children: Not on file  . Years of education: Not on file  . Highest education level: Not on file  Social Needs  . Financial resource strain: Not on file  . Food insecurity - worry: Not on file  . Food insecurity - inability: Not on file  .  Transportation needs - medical: Not on file  . Transportation needs - non-medical: Not on file  Occupational History  . Not on file  Tobacco Use  . Smoking status: Never Smoker  . Smokeless tobacco: Never Used  Substance and Sexual Activity  . Alcohol use: No  . Drug use: No  . Sexual activity: Yes  Other Topics Concern  . Not on file  Social History Narrative   Married.   3 children.   Works in [employer].    Enjoys exercising and playing basketball.      Past Surgical History:  Procedure Laterality Date  . HYSTEROSCOPY N/A 10/31/2016   Procedure: HYSTEROSCOPY WITH IUD REMOVAL;  Surgeon: Hildred Laser, MD;  Location: [facility] ORS;  Service: Gynecology;  Laterality: N/A;  . iud extraction  10/31/2016  . WISDOM TOOTH EXTRACTION      Family History  Problem Relation Age of Onset  . Diabetes Father   . Hypertension Father   . Hypothyroidism Father   . Hypertension Mother   . Breast cancer Paternal Aunt        late 30's or early 40's    No Known Allergies  No current outpatient medications on file prior to visit.   No current facility-administered medications on file prior to visit.     BP 110/64   Pulse (!) 53   Temp 98 F (36.7 C) (Oral)   Ht 5' 8.75" (  1.746 m)   Wt 176 lb 12 oz (80.2 kg)   LMP 12/17/2017   SpO2 99%   BMI 26.29 kg/m    Objective:   Physical Exam  Constitutional: She appears well-nourished.  HENT:  Right Ear: Ear canal normal. Tympanic membrane is not erythematous, not retracted and not bulging.  Left Ear: Ear canal normal. Tympanic membrane is retracted. Tympanic membrane is not erythematous and not bulging.  Nose: Right sinus exhibits no maxillary sinus tenderness and no frontal sinus tenderness. Left sinus exhibits no maxillary sinus tenderness and no frontal sinus tenderness.  Mouth/Throat: Oropharynx is clear and moist.  Dullness to right TM  Eyes: Conjunctivae are normal.  Neck: Neck supple.  Cardiovascular: Normal  rate and regular rhythm.  Pulmonary/Chest: Effort normal and breath sounds normal. She has no wheezes. She has no rales.  Lymphadenopathy:    She has no cervical adenopathy.  Skin: Skin is warm and dry.          Assessment & Plan:  Ear Fullness:  Located to left ear for several weeks. Exam today without signs of infection, cerumen impaction, otitis externa. Given retracted TM do suspect some allergy component.  Rx for Flonase sent to pharmacy to use PRN.  Doreene NestKatherine K Emma Schupp, NP

## 2017-12-19 NOTE — Assessment & Plan Note (Signed)
Difficulty with weight loss and frustration with lack of success given efforts x 2 months. Check TSH, Free T4, TPA given family history.   Also encouraged her to continue with efforts since she's only been trying for two months and her lifestyle prior to this was not healthy. Discussed to stop eating fast food during the weekends.

## 2017-12-22 LAB — THYROID PEROXIDASE ANTIBODY

## 2018-01-08 ENCOUNTER — Ambulatory Visit (INDEPENDENT_AMBULATORY_CARE_PROVIDER_SITE_OTHER): Payer: No Typology Code available for payment source | Admitting: Certified Nurse Midwife

## 2018-01-08 ENCOUNTER — Encounter: Payer: Self-pay | Admitting: Certified Nurse Midwife

## 2018-01-08 VITALS — BP 119/65 | HR 64 | Wt 174.4 lb

## 2018-01-08 DIAGNOSIS — N898 Other specified noninflammatory disorders of vagina: Secondary | ICD-10-CM | POA: Diagnosis not present

## 2018-01-08 NOTE — Progress Notes (Signed)
GYN ENCOUNTER NOTE  Subjective:       Lori Mcgee is a 32 y.o. G60P0003 female here for gynecologic evaluation of vaginal itching and irritation for the last few months. Has not attempted any home treatment measures.   Denies difficulty breathing or respiratory distress, chest pain, abdominal pain, excessive vaginal bleeding, dysuria, and leg pain or swelling.   Personal history of recurrent bacterial vaginosis prior to changing soaps.    Gynecologic History  Patient's last menstrual period was 12/17/2017.  Contraception: coitus interruptus.   Last Pap: 2018. Results were: Negative/Negative.   Last mammogram: 2018. Results were: BI-RADS 3.   Obstetric History  OB History  Gravida Para Term Preterm AB Living  3 0 0 0 0 3  SAB TAB Ectopic Multiple Live Births  0 0 0   3    # Outcome Date GA Lbr Len/2nd Weight Sex Delivery Anes PTL Lv  3 Gravida 2015    M Vag-Spont   LIV  2 Gravida 2012    F Vag-Spont   LIV  1 Gravida 2010    M Vag-Spont   LIV      Past Medical History:  Diagnosis Date  . BV (bacterial vaginosis)     Past Surgical History:  Procedure Laterality Date  . HYSTEROSCOPY N/A 10/31/2016   Procedure: HYSTEROSCOPY WITH IUD REMOVAL;  Surgeon: Hildred Laser, MD;  Location: [facility] ORS;  Service: Gynecology;  Laterality: N/A;  . iud extraction  10/31/2016  . WISDOM TOOTH EXTRACTION      Current Outpatient Medications on File Prior to Visit  Medication Sig Dispense Refill  . fluticasone (FLONASE) 50 MCG/ACT nasal spray Place 1 spray into both nostrils 2 (two) times daily as needed (ear fullness/pain). (Patient not taking: Reported on 01/08/2018) 16 g 0   No current facility-administered medications on file prior to visit.     No Known Allergies  Social History   Socioeconomic History  . Marital status: Married    Spouse name: Not on file  . Number of children: Not on file  . Years of education: Not on file  . Highest education level: Not on file  Social  Needs  . Financial resource strain: Not on file  . Food insecurity - worry: Not on file  . Food insecurity - inability: Not on file  . Transportation needs - medical: Not on file  . Transportation needs - non-medical: Not on file  Occupational History  . Not on file  Tobacco Use  . Smoking status: Never Smoker  . Smokeless tobacco: Never Used  Substance and Sexual Activity  . Alcohol use: No  . Drug use: No  . Sexual activity: Yes  Other Topics Concern  . Not on file  Social History Narrative   Married.   3 children.   Works in [employer].    Enjoys exercising and playing basketball.      Family History  Problem Relation Age of Onset  . Diabetes Father   . Hypertension Father   . Hypothyroidism Father   . Hypertension Mother   . Breast cancer Paternal Aunt        late 42's or early 53's    The following portions of the patient's history were reviewed and updated as appropriate: allergies, current medications, past family history, past medical history, past social history, past surgical history and problem list.  Review of Systems  Review of Systems - Negative except as noted above. History obtained from the patient.  Objective:   BP 119/65   Pulse 64   Wt 174 lb 6.4 oz (79.1 kg)   LMP 12/17/2017   BMI 25.94 kg/m    CONSTITUTIONAL: Well-developed, well-nourished female in no acute distress.   PELVIC:  External Genitalia: Normal  Vagina: Erythema noted, white thin discharge present  Cervix: Normal, white clear discharge present    Assessment:   1. Vaginal itching  2. Vaginal discharge   Plan:   Labs: NuSwab collected, will contact patient via MyChart with results.   Discussed vaginal health techniques.   RTC as needed.    Gunnar BullaJenkins Michelle Nakema Fake, CNM [facility], [facility]

## 2018-01-08 NOTE — Patient Instructions (Signed)

## 2018-01-12 LAB — NUSWAB VAGINITIS (VG)
CANDIDA ALBICANS, NAA: POSITIVE — AB
Candida glabrata, NAA: NEGATIVE
TRICH VAG BY NAA: NEGATIVE

## 2018-04-07 ENCOUNTER — Encounter: Payer: No Typology Code available for payment source | Admitting: Obstetrics and Gynecology

## 2018-04-10 ENCOUNTER — Other Ambulatory Visit: Payer: Self-pay | Admitting: Primary Care

## 2018-04-10 DIAGNOSIS — Z0000 Encounter for general adult medical examination without abnormal findings: Secondary | ICD-10-CM

## 2018-04-13 ENCOUNTER — Other Ambulatory Visit (INDEPENDENT_AMBULATORY_CARE_PROVIDER_SITE_OTHER): Payer: No Typology Code available for payment source

## 2018-04-13 DIAGNOSIS — Z0000 Encounter for general adult medical examination without abnormal findings: Secondary | ICD-10-CM | POA: Diagnosis not present

## 2018-04-13 LAB — COMPREHENSIVE METABOLIC PANEL
ALBUMIN: 4.3 g/dL (ref 3.5–5.2)
ALK PHOS: 58 U/L (ref 39–117)
ALT: 10 U/L (ref 0–35)
AST: 15 U/L (ref 0–37)
BILIRUBIN TOTAL: 0.6 mg/dL (ref 0.2–1.2)
BUN: 11 mg/dL (ref 6–23)
CO2: 29 mEq/L (ref 19–32)
Calcium: 9.3 mg/dL (ref 8.4–10.5)
Chloride: 104 mEq/L (ref 96–112)
Creatinine, Ser: 0.72 mg/dL (ref 0.40–1.20)
GFR: 99.48 mL/min (ref >60.00)
Glucose, Bld: 92 mg/dL (ref 70–99)
POTASSIUM: 3.9 meq/L (ref 3.5–5.1)
Sodium: 138 mEq/L (ref 135–145)
TOTAL PROTEIN: 7.4 g/dL (ref 6.0–8.3)

## 2018-04-13 LAB — LIPID PANEL
CHOLESTEROL: 162 mg/dL (ref 0–200)
HDL: 44.9 mg/dL (ref >39.00)
LDL Cholesterol: 105 mg/dL — ABNORMAL HIGH (ref 0–99)
NonHDL: 117.17
TRIGLYCERIDES: 59 mg/dL (ref 0.0–149.0)
Total CHOL/HDL Ratio: 4
VLDL: 11.8 mg/dL (ref 0.0–40.0)

## 2018-04-16 ENCOUNTER — Encounter: Payer: Self-pay | Admitting: Primary Care

## 2018-04-16 ENCOUNTER — Ambulatory Visit (INDEPENDENT_AMBULATORY_CARE_PROVIDER_SITE_OTHER): Payer: No Typology Code available for payment source | Admitting: Primary Care

## 2018-04-16 VITALS — BP 120/68 | HR 92 | Temp 98.2°F | Ht 68.75 in | Wt 173.0 lb

## 2018-04-16 DIAGNOSIS — Z021 Encounter for pre-employment examination: Secondary | ICD-10-CM | POA: Insufficient documentation

## 2018-04-16 DIAGNOSIS — Z0000 Encounter for general adult medical examination without abnormal findings: Secondary | ICD-10-CM | POA: Diagnosis not present

## 2018-04-16 NOTE — Progress Notes (Signed)
Subjective:    Patient ID: Lori Mcgee, female    DOB: [birth year], 32 y.o.   MRN: [id number]  HPI  Lori Mcgee is a 32 year old female who presents today for complete physical.  Immunizations: -Tetanus: Unsure -Influenza: Completed last season   Diet: She endorses a healthy diet Breakfast: Smoothie, oatmeal Lunch: Vegetables, nuts, rice Dinner: Vegetables, potatoes, fish Snacks: Nuts Desserts: Daily Beverages: Coffee, water   Exercise: She is exercising 5 days weekly Eye exam: Completed 2 years ago Dental exam: Completes semi-annually Pap Smear: Completed in 2018   Review of Systems  Constitutional: Negative for unexpected weight change.  HENT: Negative for rhinorrhea.   Respiratory: Negative for cough and shortness of breath.   Cardiovascular: Negative for chest pain.  Gastrointestinal: Negative for constipation and diarrhea.  Genitourinary: Negative for difficulty urinating and menstrual problem.  Musculoskeletal: Negative for arthralgias and myalgias.  Skin: Negative for rash.  Allergic/Immunologic: Negative for environmental allergies.  Neurological: Negative for dizziness, numbness and headaches.  Psychiatric/Behavioral: The patient is not nervous/anxious.        Past Medical History:  Diagnosis Date  . BV (bacterial vaginosis)      Social History   Socioeconomic History  . Marital status: Married    Spouse name: Not on file  . Number of children: Not on file  . Years of education: Not on file  . Highest education level: Not on file  Occupational History  . Not on file  Social Needs  . Financial resource strain: Not on file  . Food insecurity:    Worry: Not on file    Inability: Not on file  . Transportation needs:    Medical: Not on file    Non-medical: Not on file  Tobacco Use  . Smoking status: Never Smoker  . Smokeless tobacco: Never Used  Substance and Sexual Activity  . Alcohol use: No  . Drug use: No  . Sexual activity: Yes    Lifestyle  . Physical activity:    Days per week: Not on file    Minutes per session: Not on file  . Stress: Not on file  Relationships  . Social connections:    Talks on phone: Not on file    Gets together: Not on file    Attends religious service: Not on file    Active member of club or organization: Not on file    Attends meetings of clubs or organizations: Not on file    Relationship status: Not on file  . Intimate partner violence:    Fear of current or ex partner: Not on file    Emotionally abused: Not on file    Physically abused: Not on file    Forced sexual activity: Not on file  Other Topics Concern  . Not on file  Social History Narrative   Married.   3 children.   Works in [employer].    Enjoys exercising and playing basketball.      Past Surgical History:  Procedure Laterality Date  . HYSTEROSCOPY N/A 10/31/2016   Procedure: HYSTEROSCOPY WITH IUD REMOVAL;  Surgeon: Hildred Laser, MD;  Location: [facility] OR[facility];  Service: Gynecology;  Laterality: N/A;  . iud extraction  10/31/2016  . WISDOM TOOTH EXTRACTION      Family History  Problem Relation Age of Onset  . Diabetes Father   . Hypertension Father   . Hypothyroidism Father   . Hypertension Mother   . Breast cancer Paternal Aunt  late 30's or early 40's    No Known Allergies  No current outpatient medications on file prior to visit.   No current facility-administered medications on file prior to visit.     BP 120/68   Pulse 92   Temp 98.2 F (36.8 C) (Oral)   Ht 5' 8.75" (1.746 m)   Wt 173 lb (78.5 kg)   LMP 03/29/2018   SpO2 99%   BMI 25.73 kg/m    Objective:   Physical Exam  Constitutional: She is oriented to person, place, and time. She appears well-nourished.  HENT:  Mouth/Throat: No oropharyngeal exudate.  Eyes: Pupils are equal, round, and reactive to light. EOM are normal.  Neck: Neck supple. No thyromegaly present.  Cardiovascular: Normal rate and regular  rhythm.  Respiratory: Effort normal and breath sounds normal.  GI: Soft. Bowel sounds are normal. There is no tenderness.  Musculoskeletal: Normal range of motion.  Neurological: She is alert and oriented to person, place, and time.  Skin: Skin is warm and dry.  Psychiatric: She has a normal mood and affect.           Assessment & Plan:

## 2018-04-16 NOTE — Assessment & Plan Note (Signed)
Immunizations UTD. Pap smear UTD. Commended her on a healthy lifestyle with diet and exercise. Exam unremarkable. Labs unremarkable. Follow up in 1 year for CPE.

## 2018-04-16 NOTE — Patient Instructions (Signed)
Continue exercising. You should be getting 150 minutes of moderate intensity exercise weekly.  Continue to eat a healthy diet.  Follow up in 1 year for your annual exam or sooner if needed.  It was a pleasure to see you today!

## 2018-05-05 ENCOUNTER — Telehealth: Payer: Self-pay | Admitting: *Deleted

## 2018-05-05 NOTE — Telephone Encounter (Signed)
Copied from CRM [phone]. Topic: Referral - Request >> May 05, 2018 11:02 AM Tamela OddiMartin, Don'Quash[place], NT wrote: Reason for CRM: patient called and states that the chiropractor is need the last office note stating that Mayra ReelKate Clark is referring her to them . Please fax that OV fax # [phone]

## 2018-05-05 NOTE — Telephone Encounter (Signed)
Per DPR, left detail message for patient to came by to sign a medical release form so we can send the office noted to the chiropractor.

## 2018-06-03 ENCOUNTER — Ambulatory Visit: Payer: Self-pay | Admitting: Primary Care

## 2018-06-03 NOTE — Telephone Encounter (Signed)
Pt c/o intermittent right upper quadrant pain that started Sunday.  Sitting still she has no pain but when she moves and makes a quick turns she has sharp pain. Pt stated the pain feels like something has "ripped." and Monday she stated the pain changed to a dull achy pain. Pain is not worsened after eating. Pt denies vomiting, diarrhea, constipation or urine problems. Tuesday she stated the pain was better until last night she was running up the stairs and made a quick turn and caused pain. Pt stated if she bends over it causes pain. Pt was not having any pain at the time of call. Pt had many scheduling restrictions. Pt stated she could only be seen after 1 pm today and tomorrow after 3 pm and Friday am before 1000 am. Ina KickSkyped Rena for permission to use same day as pt's PCP not available and times that were available did not fit pt's time restriction. Appt given for 06/04/18 3:40 pm with Dr Patsy Lageropland.   Reason for Disposition . [1] MILD pain (e.g., does not interfere with normal activities) AND [2] pain comes and goes (cramps) AND [3] present > 48 hours  Answer Assessment - Initial Assessment Questions 1. LOCATION: "Where does it hurt?"      Upper right hand side of abdomen 2. RADIATION: "Does the pain shoot anywhere else?" (e.g., chest, back)     no 3. ONSET: "When did the pain begin?" (e.g., minutes, hours or days ago)      Sunday 4. SUDDEN: "Gradual or sudden onset?"     Sudden onset 5. PATTERN "Does the pain come and go, or is it constant?"    - If constant: "Is it getting better, staying the same, or worsening?"      (Note: Constant means the pain never goes away completely; most serious pain is constant and it progresses)     - If intermittent: "How long does it last?" "Do you have pain now?"     (Note: Intermittent means the pain goes away completely between bouts)     Comes and goes-minute or two- no pain now 6. SEVERITY: "How bad is the pain?"  (e.g., Scale 1-10; mild, moderate, or severe)   - MILD (1-3): doesn't interfere with normal activities, abdomen soft and not tender to touch    - MODERATE (4-7): interferes with normal activities or awakens from sleep, tender to touch    - SEVERE (8-10): excruciating pain, doubled over, unable to do any normal activities      When she had the pain on Sunday 8-9/10, last night 6-7/10 this no unless she moves the wrong way 7. RECURRENT SYMPTOM: "Have you ever had this type of abdominal pain before?" If so, ask: "When was the last time?" and "What happened that time?"      no 8. CAUSE: "What do you think is causing the abdominal pain?"     Pulled muscle 9. RELIEVING/AGGRAVATING FACTORS: "What makes it better or worse?" (e.g., movement, antacids, bowel movement)     Avoid movement that causes the pain- turning around fast or a sharp movement 10. OTHER SYMPTOMS: "Has there been any vomiting, diarrhea, constipation, or urine problems?"       no 11. PREGNANCY: "Is there any chance you are pregnant?" "When was your last menstrual period?"       Pt does not know LMP: July 4th  Protocols used: ABDOMINAL PAIN [facility]- FEMALE-A-AH

## 2018-06-04 ENCOUNTER — Encounter: Payer: Self-pay | Admitting: Family Medicine

## 2018-06-04 ENCOUNTER — Encounter: Payer: 59 | Admitting: Obstetrics and Gynecology

## 2018-06-04 ENCOUNTER — Encounter: Payer: Self-pay | Admitting: *Deleted

## 2018-06-04 ENCOUNTER — Ambulatory Visit (INDEPENDENT_AMBULATORY_CARE_PROVIDER_SITE_OTHER): Payer: No Typology Code available for payment source | Admitting: Family Medicine

## 2018-06-04 VITALS — BP 110/72 | HR 60 | Temp 98.8°F | Ht 68.75 in | Wt 175.0 lb

## 2018-06-04 DIAGNOSIS — S39011A Strain of muscle, fascia and tendon of abdomen, initial encounter: Secondary | ICD-10-CM | POA: Diagnosis not present

## 2018-06-04 DIAGNOSIS — R109 Unspecified abdominal pain: Secondary | ICD-10-CM

## 2018-06-04 LAB — POC URINALSYSI DIPSTICK (AUTOMATED)
BILIRUBIN UA: NEGATIVE
GLUCOSE UA: NEGATIVE
Ketones, UA: NEGATIVE
Leukocytes, UA: NEGATIVE
NITRITE UA: NEGATIVE
Protein, UA: NEGATIVE
SPEC GRAV UA: 1.015 (ref 1.010–1.025)
Urobilinogen, UA: 0.2 E.U./dL
pH, UA: 6 (ref 5.0–8.0)

## 2018-06-04 LAB — POCT URINE PREGNANCY: PREG TEST UR: NEGATIVE

## 2018-06-04 NOTE — Progress Notes (Signed)
Dr. Karleen HampshireSpencer T. Lekha Dancer, MD, CAQ Sports Medicine [facility] and Sports Medicine [address] [place], [id number] Phone: [phone] Fax: [phone]  06/04/2018  Patient: Lori BeachRachel L Hanning, MRN: [id number], DOB: [birth year], 32 y.o.  Primary Physician:  Doreene Nestlark, Katherine K, NP   Chief Complaint  Patient presents with  . Abdominal Pain    RIght Side of Navel   Subjective:   Lori BeachRachel L Criscuolo is a 32 y.o. very pleasant female patient who presents with the following:  Pain in her right side [place] [address] [place]. Instantly felt some heat from standing and then sent her to the ground. Today does not any real pain.  Her pain is isolated to the right side laterally and worsened with movements, particular with twisting movements.  She denied any fevers or chills.  She is eating and drinking normally without any significant nausea, vomiting, or diarrhea.  She denies any dysuria, frequency, or discharge.  Past Medical History, Surgical History, Social History, Family History, Problem List, Medications, and Allergies have been reviewed and updated if relevant.  Patient Active Problem List   Diagnosis Date Noted  . Preventative health care 04/16/2018    Past Medical History:  Diagnosis Date  . BV (bacterial vaginosis)     Past Surgical History:  Procedure Laterality Date  . HYSTEROSCOPY N/A 10/31/2016   Procedure: HYSTEROSCOPY WITH IUD REMOVAL;  Surgeon: Hildred LaserAnika Cherry, MD;  Location: [facility] ORS;  Service: Gynecology;  Laterality: N/A;  . iud extraction  10/31/2016  . WISDOM TOOTH EXTRACTION      Social History   Socioeconomic History  . Marital status: Married    Spouse name: Not on file  . Number of children: Not on file  . Years of education: Not on file  . Highest education level: Not on file  Occupational History  . Not on file  Social Needs  . Financial resource strain: Not on file  . Food insecurity:    Worry: Not on file    Inability: Not  on file  . Transportation needs:    Medical: Not on file    Non-medical: Not on file  Tobacco Use  . Smoking status: Never Smoker  . Smokeless tobacco: Never Used  Substance and Sexual Activity  . Alcohol use: No  . Drug use: No  . Sexual activity: Yes  Lifestyle  . Physical activity:    Days per week: Not on file    Minutes per session: Not on file  . Stress: Not on file  Relationships  . Social connections:    Talks on phone: Not on file    Gets together: Not on file    Attends religious service: Not on file    Active member of club or organization: Not on file    Attends meetings of clubs or organizations: Not on file    Relationship status: Not on file  . Intimate partner violence:    Fear of current or ex partner: Not on file    Emotionally abused: Not on file    Physically abused: Not on file    Forced sexual activity: Not on file  Other Topics Concern  . Not on file  Social History Narrative   Married.   3 children.   Works in [employer].    Enjoys exercising and playing basketball.      Family History  Problem Relation Age of Onset  . Diabetes Father   . Hypertension  Father   . Hypothyroidism Father   . Hypertension Mother   . Breast cancer Paternal Aunt        late 30's or early 40's    No Known Allergies  Medication list reviewed and updated in full in [facility].   GEN: No acute illnesses, no fevers, chills. GI: No n/v/d, eating normally Pulm: No SOB Interactive and getting along well at home.  Otherwise, ROS is as per the HPI.  Objective:   BP 110/72   Pulse 60   Temp 98.8 F (37.1 C) (Oral)   Ht 5' 8.75" (1.746 m)   Wt 175 lb (79.4 kg)   LMP 05/07/2018   BMI 26.03 kg/m   GEN: WDWN, NAD, Non-toxic, A & O x 3 HEENT: Atraumatic, Normocephalic. Neck supple. No masses, No LAD. Ears and Nose: No external deformity. CV: RRR, No M/G/R. No JVD. No thrill. No extra heart sounds. PULM: CTA B, no wheezes, crackles,  rhonchi. No retractions. No resp. distress. No accessory muscle use. EXTR: No c/c/e NEURO Normal gait.  PSYCH: Normally interactive. Conversant. Not depressed or anxious appearing.  Calm demeanor.   On abdominal exam the patient is soft, nontender, nondistended.  Positive bowel sounds.  Sit ups because no pain.  Resisted right sided leg lift causes pain.  Rotational movements that are resisted also cause some pain.  Laboratory and Imaging Data: Results for orders placed or performed in visit on 06/04/18  POCT Urinalysis Dipstick (Automated)  Result Value Ref Range   Color, UA Yellow    Clarity, UA Clear    Glucose, UA Negative Negative   Bilirubin, UA Negative    Ketones, UA Negative    Spec Grav, UA 1.015 1.010 - 1.025   Blood, UA Trace    pH, UA 6.0 5.0 - 8.0   Protein, UA Negative Negative   Urobilinogen, UA 0.2 0.2 or 1.0 E.U./dL   Nitrite, UA Negative    Leukocytes, UA Negative Negative  POCT urine pregnancy  Result Value Ref Range   Preg Test, Ur Negative Negative     Assessment and Plan:   Strain of abdominal muscle, initial encounter  Right sided abdominal pain - Plan: POCT Urinalysis Dipstick (Automated), POCT urine pregnancy  Pretty clearly a muscular injury on the right side of the lower abdominal wall, most likely junction of external oblique and rectus abdominis, transversus abdominis might be involved also.  Minimal limitations, she is already getting better.  Reviewed some basic ab rehab that she can do. No limits to running.  Follow-up: No follow-ups on file.  Orders Placed This Encounter  Procedures  . POCT Urinalysis Dipstick (Automated)  . POCT urine pregnancy    Signed,  Jaslene Marsteller T. Timothey Dahlstrom, MD   Allergies as of 06/04/2018   No Known Allergies     Medication List    as of 06/04/2018 11:59 PM   You have not been prescribed any medications.

## 2018-06-05 ENCOUNTER — Encounter: Payer: Self-pay | Admitting: Family Medicine

## 2018-06-27 ENCOUNTER — Other Ambulatory Visit: Payer: Self-pay

## 2018-06-27 ENCOUNTER — Ambulatory Visit
Admission: EM | Admit: 2018-06-27 | Discharge: 2018-06-27 | Disposition: A | Discharge status: Home / Self Care | Payer: No Typology Code available for payment source | Attending: Family Medicine | Admitting: Family Medicine

## 2018-06-27 DIAGNOSIS — R5383 Other fatigue: Secondary | ICD-10-CM

## 2018-06-27 DIAGNOSIS — J029 Acute pharyngitis, unspecified: Secondary | ICD-10-CM | POA: Diagnosis not present

## 2018-06-27 DIAGNOSIS — R52 Pain, unspecified: Secondary | ICD-10-CM | POA: Diagnosis not present

## 2018-06-27 LAB — RAPID STREP SCREEN (MED CTR MEBANE ONLY): Streptococcus, Group A Screen (Direct): NEGATIVE

## 2018-06-27 MED ORDER — IBUPROFEN 600 MG PO TABS
600.0000 mg | ORAL_TABLET | Freq: Four times a day (QID) | ORAL | 0 refills | Status: AC | PRN
Start: 2018-06-27 — End: 2018-07-02

## 2018-06-27 MED ORDER — AMOXICILLIN 500 MG PO CAPS: 500.0000 mg | ORAL_CAPSULE | Freq: Two times a day (BID) | ORAL | 0 refills | Status: AC

## 2018-06-27 NOTE — ED Triage Notes (Signed)
Pt with fever, body aches and sore throat. Pain started on Wednesday. Pain 8/10

## 2018-06-27 NOTE — ED Provider Notes (Signed)
[facility]    CSN: [id number] Arrival date & time: 06/27/18  1153     History   Chief Complaint Chief Complaint  Patient presents with  . Sore Throat    HPI Lori Mcgee is a 32 y.o. female. Patient presents complaining of severe throat pain, swollen and tender lymph nodes of the neck, body aches, and chills x 3 days. Denies cough, congestion, abdominal pain, N/V. Has not taken anything for symptoms. Denies being around known sick contacts.   HPI  Past Medical History:  Diagnosis Date  . BV (bacterial vaginosis)     Patient Active Problem List   Diagnosis Date Noted  . Preventative health care 04/16/2018    Past Surgical History:  Procedure Laterality Date  . HYSTEROSCOPY N/A 10/31/2016   Procedure: HYSTEROSCOPY WITH IUD REMOVAL;  Surgeon: Hildred LaserAnika Cherry, MD;  Location: [facility] ORS;  Service: Gynecology;  Laterality: N/A;  . iud extraction  10/31/2016  . WISDOM TOOTH EXTRACTION      OB History    Gravida  3   Para  0   Term  0   Preterm  0   AB  0   Living  3     SAB  0   TAB  0   Ectopic  0   Multiple      Live Births  3            Home Medications    Prior to Admission medications   Not on File    Family History Family History  Problem Relation Age of Onset  . Diabetes Father   . Hypertension Father   . Hypothyroidism Father   . Hypertension Mother   . Breast cancer Paternal Aunt        late 5830's or early 3840's    Social History Social History   Tobacco Use  . Smoking status: Never Smoker  . Smokeless tobacco: Never Used  Substance Use Topics  . Alcohol use: No  . Drug use: No     Allergies   Patient has no known allergies.   Review of Systems Review of Systems  Constitutional: Positive for appetite change, chills and fatigue. Negative for fever.  HENT: Positive for sore throat and trouble swallowing. Negative for congestion, rhinorrhea, sinus pressure and sinus pain.   Eyes: Negative for pain.    Respiratory: Negative for cough and shortness of breath.   Cardiovascular: Negative for chest pain.  Gastrointestinal: Negative for abdominal pain, diarrhea and nausea.  Skin: Negative for rash.  Neurological: Negative for weakness.  Hematological: Positive for adenopathy.     Physical Exam Triage Vital Signs ED Triage Vitals  Enc Vitals Group     BP 06/27/18 1202 119/64     Pulse Rate 06/27/18 1202 77     Resp 06/27/18 1202 16     Temp 06/27/18 1202 98.2 F (36.8 C)     Temp Source 06/27/18 1202 Oral     SpO2 06/27/18 1202 100 %     Weight 06/27/18 1204 174 lb (78.9 kg)     Height 06/27/18 1204 5' 9.5" (1.765 m)     Head Circumference --      Peak Flow --      Pain Score 06/27/18 1202 8     Pain Loc --      Pain Edu? --      Excl. in GC? --    No data found.  Updated Vital Signs BP 119/64 (BP  Location: Left Arm)   Pulse 77   Temp 98.2 F (36.8 C) (Oral)   Resp 16   Ht 5' 9.5" (1.765 m)   Wt 174 lb (78.9 kg)   LMP 06/10/2018   SpO2 100%   BMI 25.33 kg/m   Visual Acuity Right Eye Distance:   Left Eye Distance:   Bilateral Distance:    Right Eye Near:   Left Eye Near:    Bilateral Near:     Physical Exam  Constitutional: She is oriented to person, place, and time. She appears well-developed and well-nourished.  HENT:  Head: Normocephalic and atraumatic.  Right Ear: Hearing, tympanic membrane and ear canal normal.  Left Ear: Hearing, tympanic membrane and ear canal normal.  Nose: Rhinorrhea present. Right sinus exhibits no maxillary sinus tenderness and no frontal sinus tenderness. Left sinus exhibits no maxillary sinus tenderness and no frontal sinus tenderness.  Mouth/Throat: Uvula is midline and mucous membranes are normal. No uvula swelling. Posterior oropharyngeal erythema (moderate to severe erythema of posterior pharynx) present. No oropharyngeal exudate or posterior oropharyngeal edema. No tonsillar exudate.  Neck: Neck supple.  Cardiovascular:  Normal rate and regular rhythm.  Murmur heard. Pulmonary/Chest: Effort normal and breath sounds normal. She has no wheezes. She has no rales.  Lymphadenopathy:    She has cervical adenopathy (bilateral anterior cervical adenopathy that is TTP).  Neurological: She is alert and oriented to person, place, and time.  Skin: Skin is warm and dry. No rash noted.  Psychiatric: She has a normal mood and affect. Her behavior is normal.     UC Treatments / Results  Labs (all labs ordered are listed, but only abnormal results are displayed) Labs Reviewed  RAPID STREP SCREEN ([facility] ONLY)  CULTURE, GROUP A STREP [facility])    EKG None  Radiology No results found.  Procedures Procedures (including critical care time)  Medications Ordered in UC Medications - No data to display  Initial Impression / Assessment and Plan / UC Course  I have reviewed the triage vital signs and the nursing notes.  Pertinent labs & imaging results that were available during my care of the patient were reviewed by me and considered in my medical decision making (see chart for details).   Rapid strep testing is negative today but given that she meets [facility] criteria and based on physical exam will treat for strep throat while culture is pending. She should take Motrin as prescribed for body aches as well, rest and increase fluids. F/u with PCP in 48-72 hours if no improvement in symptoms.   Final Clinical Impressions(s) / UC Diagnoses   Final diagnoses:  None   Discharge Instructions   None    ED Prescriptions    None     Controlled Substance Prescriptions [place] Controlled Substance Registry consulted? Not Applicable   Gareth Morgan 06/27/18 1440

## 2018-06-27 NOTE — Discharge Instructions (Addendum)
Rapid strep testing is negative today but based on physical exam will treat for strep throat while culture is pending. You should take Motrin as prescribed for body aches as well, rest and increase fluids. F/u with PCP in 48-72 hours if no improvement in symptoms.

## 2018-06-30 LAB — CULTURE, GROUP A STREP (THRC)

## 2018-07-13 ENCOUNTER — Encounter: Payer: Self-pay | Admitting: Family Medicine

## 2018-07-13 ENCOUNTER — Ambulatory Visit (INDEPENDENT_AMBULATORY_CARE_PROVIDER_SITE_OTHER): Payer: No Typology Code available for payment source | Admitting: Family Medicine

## 2018-07-13 VITALS — BP 116/68 | HR 72 | Temp 98.4°F | Ht 68.75 in | Wt 177.5 lb

## 2018-07-13 DIAGNOSIS — J0100 Acute maxillary sinusitis, unspecified: Secondary | ICD-10-CM

## 2018-07-13 DIAGNOSIS — J019 Acute sinusitis, unspecified: Secondary | ICD-10-CM | POA: Insufficient documentation

## 2018-07-13 MED ORDER — AMOXICILLIN-POT CLAVULANATE 875-125 MG PO TABS: 1.0000 | ORAL_TABLET | Freq: Two times a day (BID) | ORAL | 0 refills | Status: AC

## 2018-07-13 NOTE — Patient Instructions (Addendum)
You have a sinus infection.  Take medicine as prescribed: augmentin course for 10 day.  Use ibuprofen 600mg  with meals for next few days.  Use flonase nasal steroid for inflammation.  Push fluids and plenty of rest.  Nasal saline irrigation or neti pot to help drain sinuses.  May use plain mucinex with plenty of fluid to help mobilize mucous. Please let us know if fever >101.5, trouble opening/closing mouth, difficulty swallowing, or worsening instead of improving as expected.

## 2018-07-13 NOTE — Assessment & Plan Note (Signed)
Of short duration, however significant travel history, recent abx use increases risk of bacterial infection. Rx augmentin 10d course. Further supportive care as per instructions. Update if not improving with treatment.

## 2018-07-13 NOTE — Progress Notes (Signed)
BP 116/68 (BP Location: Left Arm, Patient Position: Sitting, Cuff Size: Normal)   Pulse 72   Temp 98.4 F (36.9 C) (Oral)   Ht 5' 8.75" (1.746 m)   Wt 177 lb 8 oz (80.5 kg)   LMP 07/12/2018   SpO2 99%   BMI 26.40 kg/m    CC: sinus pressure Subjective:    Patient ID: Lori Mcgee, female    DOB: 1986/07/05, 32 y.o.   MRN: [id number]  HPI: Lori Mcgee is a 32 y.o. female presenting on 07/13/2018 for Sinus Problem (C/o sinus congestion, pressure, facial pain and bilateral ear pressure. Sxs started 07/11/18. Tried Altzer Cold med, bareley helpful. )   2-3d h/o sinus congestion, L>R maxillary pain and pressure, ST with PNdrainage, some chills, jaw pain. Significant nasal congestion unable to breathe through nose.   Denies fevers, ear pain, cough, dyspnea.   Taking alka seltzer cold, essential oils, fluids, rest.  No sick contacts at home No smokers at home.  No h/o asthma.   Planning trip to [place] via airplane today.  Symptoms started after another air flight.   She was recently treated for presumed strep with 10d amoxicillin course - culture returned negative but she completed antibiotics.   Relevant past medical, surgical, family and social history reviewed and updated as indicated. Interim medical history since our last visit reviewed. Allergies and medications reviewed and updated. No outpatient medications prior to visit.   No facility-administered medications prior to visit.      Per HPI unless specifically indicated in ROS section below Review of Systems     Objective:    BP 116/68 (BP Location: Left Arm, Patient Position: Sitting, Cuff Size: Normal)   Pulse 72   Temp 98.4 F (36.9 C) (Oral)   Ht 5' 8.75" (1.746 m)   Wt 177 lb 8 oz (80.5 kg)   LMP 07/12/2018   SpO2 99%   BMI 26.40 kg/m   Wt Readings from Last 3 Encounters:  07/13/18 177 lb 8 oz (80.5 kg)  06/27/18 174 lb (78.9 kg)  06/04/18 175 lb (79.4 kg)    Physical Exam  Constitutional:  She appears well-developed and well-nourished. No distress.  HENT:  Head: Normocephalic and atraumatic.  Right Ear: Hearing, tympanic membrane, external ear and ear canal normal.  Left Ear: Hearing, tympanic membrane, external ear and ear canal normal.  Nose: Mucosal edema (nasal mucosal congestion/inflammation L>R) present. No rhinorrhea. Right sinus exhibits maxillary sinus tenderness. Right sinus exhibits no frontal sinus tenderness. Left sinus exhibits maxillary sinus tenderness (L>R). Left sinus exhibits no frontal sinus tenderness.  Mouth/Throat: Uvula is midline, oropharynx is clear and moist and mucous membranes are normal. No oropharyngeal exudate, posterior oropharyngeal edema, posterior oropharyngeal erythema or tonsillar abscesses.  Eyes: Pupils are equal, round, and reactive to light. Conjunctivae and EOM are normal. No scleral icterus.  Neck: Normal range of motion. Neck supple.  Cardiovascular: Normal rate, regular rhythm, normal heart sounds and intact distal pulses.  No murmur heard. Pulmonary/Chest: Effort normal and breath sounds normal. No respiratory distress. She has no wheezes. She has no rales.  Lymphadenopathy:    She has no cervical adenopathy.  Skin: Skin is warm and dry. No rash noted.  Nursing note and vitals reviewed.     Assessment & Plan:   Problem List Items Addressed This Visit    Acute sinusitis - Primary    Of short duration, however significant travel history, recent abx use increases risk of bacterial infection. Rx  augmentin 10d course. Further supportive care as per instructions. Update if not improving with treatment.       Relevant Medications   amoxicillin-clavulanate (AUGMENTIN) 875-125 MG tablet       Meds ordered this encounter  Medications  . amoxicillin-clavulanate (AUGMENTIN) 875-125 MG tablet    Sig: Take 1 tablet by mouth 2 (two) times daily for 10 days.    Dispense:  20 tablet    Refill:  0   No orders of the defined types were  placed in this encounter.   Follow up plan: No follow-ups on file.  Eustaquio Boyden, MD

## 2018-10-16 ENCOUNTER — Encounter: Payer: No Typology Code available for payment source | Admitting: Obstetrics and Gynecology

## 2018-11-13 ENCOUNTER — Encounter: Payer: Self-pay | Admitting: Obstetrics and Gynecology

## 2018-11-13 ENCOUNTER — Ambulatory Visit (INDEPENDENT_AMBULATORY_CARE_PROVIDER_SITE_OTHER): Payer: No Typology Code available for payment source | Admitting: Obstetrics and Gynecology

## 2018-11-13 VITALS — BP 118/78 | HR 98 | Ht 70.0 in | Wt 175.7 lb

## 2018-11-13 DIAGNOSIS — Z01419 Encounter for gynecological examination (general) (routine) without abnormal findings: Secondary | ICD-10-CM

## 2018-11-13 DIAGNOSIS — N6002 Solitary cyst of left breast: Secondary | ICD-10-CM

## 2018-11-13 DIAGNOSIS — N6012 Diffuse cystic mastopathy of left breast: Secondary | ICD-10-CM

## 2018-11-13 DIAGNOSIS — N6011 Diffuse cystic mastopathy of right breast: Secondary | ICD-10-CM | POA: Diagnosis not present

## 2018-11-13 NOTE — Progress Notes (Signed)
Subjective:   Lori Mcgee is a 33 y.o. G64P0003 Caucasian female here for a routine well-woman exam.  Patient's last menstrual period was 11/02/2018.    Current complaints: left breast pain severe before menses, needs follow up mammogram.  PCP: [facility]                         ````````````````````````````````````````````````````````````````````````````````````````````````````````````````````````````````````````````````````````````````````````````````````````````````````````````````````````````````````````````````````````````````````````````````````````````````````````````````````````````````````````````` `````````````doesn't desire labs  Social History: Sexual: heterosexual Marital Status: married Living situation: with family Occupation: unknown occupation Tobacco/alcohol: no tobacco use Illicit drugs: no history of illicit drug use  The following portions of the patient's history were reviewed and updated as appropriate: allergies, current medications, past family history, past medical history, past social history, past surgical history and problem list.  Past Medical History Past Medical History:  Diagnosis Date  . BV (bacterial vaginosis)     Past Surgical History Past Surgical History:  Procedure Laterality Date  . HYSTEROSCOPY N/A 10/31/2016   Procedure: HYSTEROSCOPY WITH IUD REMOVAL;  Surgeon: Hildred Laser, MD;  Location: [facility] ORS;  Service: Gynecology;  Laterality: N/A;  . iud extraction  10/31/2016  . WISDOM TOOTH EXTRACTION      Gynecologic History G3P0003  Patient's last menstrual period was 11/02/2018. Contraception: coitus interruptus Last Pap: 05/2017. Results were: normal  Obstetric History OB History  Gravida Para Term Preterm AB Living  3 0 0 0 0 3  SAB TAB Ectopic Multiple Live Births  0 0 0   3    # Outcome Date GA Lbr Len/2nd Weight Sex Delivery Anes PTL Lv  3 Gravida 2015    M Vag-Spont   LIV  2 Gravida 2012    F Vag-Spont   LIV  1 Gravida 2010     M Vag-Spont   LIV    Current Medications No current outpatient medications on file prior to visit.   No current facility-administered medications on file prior to visit.     Review of Systems Patient denies any headaches, blurred vision, shortness of breath, chest pain, abdominal pain, problems with bowel movements, urination, or intercourse.  Objective:  BP 118/78   Pulse 98   Ht 5\' 10"  (1.778 m)   Wt 175 lb 11.2 oz (79.7 kg)   LMP 11/02/2018   BMI 25.21 kg/m  Physical Exam  General:  Well developed, well nourished, no acute distress. She is alert and oriented x3. Skin:  Warm and dry Neck:  Midline trachea, no thyromegaly or nodules Cardiovascular: Regular rate and rhythm, no murmur heard Lungs:  Effort normal, all lung fields clear to auscultation bilaterally Breasts:  No dominant palpable mass, retraction, or nipple discharge, fibrocystic tissue noted bilaterally Abdomen:  Soft, non tender, no hepatosplenomegaly or masses Pelvic:  External genitalia is normal in appearance.  The vagina is normal in appearance. The cervix is bulbous, no CMT.  Thin prep pap is not done . Uterus is felt to be normal size, shape, and contour.  No adnexal masses or tenderness noted. Extremities:  No swelling or varicosities noted Psych:  She has a normal mood and affect  Assessment:   Healthy well-woman exam Fibrocystic breast tissue noted.  Plan:  F/u MMG scheduled. Vit E 800 IU daily for one week recommended for menstrual breast pain. F/U 1 year for AE, or sooner if needed   Zaara Sprowl Suzan Nailer, CNM

## 2018-11-13 NOTE — Patient Instructions (Addendum)
 Preventive Care 18-39 Years, Female Preventive care refers to lifestyle choices and visits with your health care provider that can promote health and wellness. What does preventive care include?   A yearly physical exam. This is also called an annual well check.  Dental exams once or twice a year.  Routine eye exams. Ask your health care provider how often you should have your eyes checked.  Personal lifestyle choices, including: ? Daily care of your teeth and gums. ? Regular physical activity. ? Eating a healthy diet. ? Avoiding tobacco and drug use. ? Limiting alcohol use. ? Practicing safe sex. ? Taking vitamin and mineral supplements as recommended by your health care provider. What happens during an annual well check? The services and screenings done by your health care provider during your annual well check will depend on your age, overall health, lifestyle risk factors, and family history of disease. Counseling Your health care provider may ask you questions about your:  Alcohol use.  Tobacco use.  Drug use.  Emotional well-being.  Home and relationship well-being.  Sexual activity.  Eating habits.  Work and work environment.  Method of birth control.  Menstrual cycle.  Pregnancy history. Screening You may have the following tests or measurements:  Height, weight, and BMI.  Diabetes screening. This is done by checking your blood sugar (glucose) after you have not eaten for a while (fasting).  Blood pressure.  Lipid and cholesterol levels. These may be checked every 5 years starting at age 20.  Skin check.  Hepatitis C blood test.  Hepatitis B blood test.  Sexually transmitted disease (STD) testing.  BRCA-related cancer screening. This may be done if you have a family history of breast, ovarian, tubal, or peritoneal cancers.  Pelvic exam and Pap test. This may be done every 3 years starting at age 21. Starting at age 30, this may be done  every 5 years if you have a Pap test in combination with an HPV test. Discuss your test results, treatment options, and if necessary, the need for more tests with your health care provider. Vaccines Your health care provider may recommend certain vaccines, such as:  Influenza vaccine. This is recommended every year.  Tetanus, diphtheria, and acellular pertussis (Tdap, Td) vaccine. You may need a Td booster every 10 years.  Varicella vaccine. You may need this if you have not been vaccinated.  HPV vaccine. If you are 26 or younger, you may need three doses over 6 months.  Measles, mumps, and rubella (MMR) vaccine. You may need at least one dose of MMR. You may also need a second dose.  Pneumococcal 13-valent conjugate (PCV13) vaccine. You may need this if you have certain conditions and were not previously vaccinated.  Pneumococcal polysaccharide (PPSV23) vaccine. You may need one or two doses if you smoke cigarettes or if you have certain conditions.  Meningococcal vaccine. One dose is recommended if you are age 19-21 years and a first-year college student living in a residence hall, or if you have one of several medical conditions. You may also need additional booster doses.  Hepatitis A vaccine. You may need this if you have certain conditions or if you travel or work in places where you may be exposed to hepatitis A.  Hepatitis B vaccine. You may need this if you have certain conditions or if you travel or work in places where you may be exposed to hepatitis B.  Haemophilus influenzae type b (Hib) vaccine. You may need this if   you have certain risk factors. Talk to your health care provider about which screenings and vaccines you need and how often you need them. This information is not intended to replace advice given to you by your health care provider. Make sure you discuss any questions you have with your health care provider. Document Released: 12/17/2001 Document Revised:  06/03/2017 Document Reviewed: 08/22/2015 Elsevier Interactive Patient Education  2019 [place] daily for one week for breast pains

## 2018-11-24 ENCOUNTER — Encounter: Payer: Self-pay | Admitting: Internal Medicine

## 2018-11-24 ENCOUNTER — Ambulatory Visit (INDEPENDENT_AMBULATORY_CARE_PROVIDER_SITE_OTHER): Payer: No Typology Code available for payment source | Admitting: Internal Medicine

## 2018-11-24 VITALS — BP 116/74 | HR 61 | Temp 98.0°F | Wt 179.0 lb

## 2018-11-24 DIAGNOSIS — G8929 Other chronic pain: Secondary | ICD-10-CM | POA: Diagnosis not present

## 2018-11-24 DIAGNOSIS — M545 Low back pain, unspecified: Secondary | ICD-10-CM

## 2018-11-24 DIAGNOSIS — M25511 Pain in right shoulder: Secondary | ICD-10-CM

## 2018-11-24 MED ORDER — NAPROXEN 500 MG PO TABS: 500.0000 mg | ORAL_TABLET | Freq: Two times a day (BID) | ORAL | 0 refills | Status: DC

## 2018-11-24 NOTE — Patient Instructions (Signed)
Shoulder Exercises Ask your health care provider which exercises are safe for you. Do exercises exactly as told by your health care provider and adjust them as directed. It is normal to feel mild stretching, pulling, tightness, or discomfort as you do these exercises, but you should stop right away if you feel sudden pain or your pain gets worse.Do not begin these exercises until told by your health care provider. Range of Motion Exercises        These exercises warm up your muscles and joints and improve the movement and flexibility of your shoulder. These exercises also help to relieve pain, numbness, and tingling. These exercises involve stretching your injured shoulder directly. Exercise A: Pendulum 1. Stand near a wall or a surface that you can hold onto for balance. 2. Bend at the waist and let your left / right arm hang straight down. Use your other arm to support you. Keep your back straight and do not lock your knees. 3. Relax your left / right arm and shoulder muscles, and move your hips and your trunk so your left / right arm swings freely. Your arm should swing because of the motion of your body, not because you are using your arm or shoulder muscles. 4. Keep moving your body so your arm swings in the following directions, as told by your health care provider: ? Side to side. ? Forward and backward. ? In clockwise and counterclockwise circles. 5. Continue each motion for __________ seconds, or for as long as told by your health care provider. 6. Slowly return to the starting position. Repeat __________ times. Complete this exercise __________ times a day. Exercise B:Flexion, Standing 1. Stand and hold a broomstick, a cane, or a similar object. Place your hands a little more than shoulder-width apart on the object. Your left / right hand should be palm-up, and your other hand should be palm-down. 2. Keep your elbow straight and keep your shoulder muscles relaxed. Push the stick  down with your healthy arm to raise your left / right arm in front of your body, and then over your head until you feel a stretch in your shoulder. ? Avoid shrugging your shoulder while you raise your arm. Keep your shoulder blade tucked down toward the middle of your back. 3. Hold for __________ seconds. 4. Slowly return to the starting position. Repeat __________ times. Complete this exercise __________ times a day. Exercise C: Abduction, Standing 1. Stand and hold a broomstick, a cane, or a similar object. Place your hands a little more than shoulder-width apart on the object. Your left / right hand should be palm-up, and your other hand should be palm-down. 2. While keeping your elbow straight and your shoulder muscles relaxed, push the stick across your body toward your left / right side. Raise your left / right arm to the side of your body and then over your head until you feel a stretch in your shoulder. ? Do not raise your arm above shoulder height, unless your health care provider tells you to do that. ? Avoid shrugging your shoulder while you raise your arm. Keep your shoulder blade tucked down toward the middle of your back. 3. Hold for __________ seconds. 4. Slowly return to the starting position. Repeat __________ times. Complete this exercise __________ times a day. Exercise D:Internal Rotation 1. Place your left / right hand behind your back, palm-up. 2. Use your other hand to dangle an exercise band, a towel, or a similar object over your shoulder.   Grasp the band with your left / right hand so you are holding onto both ends. 3. Gently pull up on the band until you feel a stretch in the front of your left / right shoulder. ? Avoid shrugging your shoulder while you raise your arm. Keep your shoulder blade tucked down toward the middle of your back. 4. Hold for __________ seconds. 5. Release the stretch by letting go of the band and lowering your hands. Repeat __________ times.  Complete this exercise __________ times a day. Stretching Exercises  These exercises warm up your muscles and joints and improve the movement and flexibility of your shoulder. These exercises also help to relieve pain, numbness, and tingling. These exercises are done using your healthy shoulder to help stretch the muscles of your injured shoulder. Exercise E: Corner Stretch (External Rotation and Abduction) 1. Stand in a doorway with one of your feet slightly in front of the other. This is called a staggered stance. If you cannot reach your forearms to the door frame, stand facing a corner of a room. 2. Choose one of the following positions as told by your health care provider: ? Place your hands and forearms on the door frame above your head. ? Place your hands and forearms on the door frame at the height of your head. ? Place your hands on the door frame at the height of your elbows. 3. Slowly move your weight onto your front foot until you feel a stretch across your chest and in the front of your shoulders. Keep your head and chest upright and keep your abdominal muscles tight. 4. Hold for __________ seconds. 5. To release the stretch, shift your weight to your back foot. Repeat __________ times. Complete this stretch __________ times a day. Exercise F:Extension, Standing 1. Stand and hold a broomstick, a cane, or a similar object behind your back. ? Your hands should be a little wider than shoulder-width apart. ? Your palms should face away from your back. 2. Keeping your elbows straight and keeping your shoulder muscles relaxed, move the stick away from your body until you feel a stretch in your shoulder. ? Avoid shrugging your shoulders while you move the stick. Keep your shoulder blade tucked down toward the middle of your back. 3. Hold for __________ seconds. 4. Slowly return to the starting position. Repeat __________ times. Complete this exercise __________ times a  day. Strengthening Exercises           These exercises build strength and endurance in your shoulder. Endurance is the ability to use your muscles for a long time, even after they get tired. Exercise G:External Rotation 1. Sit in a stable chair without armrests. 2. Secure an exercise band at elbow height on your left / right side. 3. Place a soft object, such as a folded towel or a small pillow, between your left / right upper arm and your body to move your elbow a few inches away (about 10 cm) from your side. 4. Hold the end of the band so it is tight and there is no slack. 5. Keeping your elbow pressed against the soft object, move your left / right forearm out, away from your abdomen. Keep your body steady so only your forearm moves. 6. Hold for __________ seconds. 7. Slowly return to the starting position. Repeat __________ times. Complete this exercise __________ times a day. Exercise H:Shoulder Abduction 1. Sit in a stable chair without armrests, or stand. 2. Hold a __________ weight in your   left / right hand, or hold an exercise band with both hands. 3. Start with your arms straight down and your left / right palm facing in, toward your body. 4. Slowly lift your left / right hand out to your side. Do not lift your hand above shoulder height unless your health care provider tells you that this is safe. ? Keep your arms straight. ? Avoid shrugging your shoulder while you do this movement. Keep your shoulder blade tucked down toward the middle of your back. 5. Hold for __________ seconds. 6. Slowly lower your arm, and return to the starting position. Repeat __________ times. Complete this exercise __________ times a day. Exercise I:Shoulder Extension 1. Sit in a stable chair without armrests, or stand. 2. Secure an exercise band to a stable object in front of you where it is at shoulder height. 3. Hold one end of the exercise band in each hand. Your palms should face each  other. 4. Straighten your elbows and lift your hands up to shoulder height. 5. Step back, away from the secured end of the exercise band, until the band is tight and there is no slack. 6. Squeeze your shoulder blades together as you pull your hands down to the sides of your thighs. Stop when your hands are straight down by your sides. Do not let your hands go behind your body. 7. Hold for __________ seconds. 8. Slowly return to the starting position. Repeat __________ times. Complete this exercise __________ times a day. Exercise J:Standing Shoulder Row 1. Sit in a stable chair without armrests, or stand. 2. Secure an exercise band to a stable object in front of you so it is at waist height. 3. Hold one end of the exercise band in each hand. Your palms should be in a thumbs-up position. 4. Bend each of your elbows to an "L" shape (about 90 degrees) and keep your upper arms at your sides. 5. Step back until the band is tight and there is no slack. 6. Slowly pull your elbows back behind you. 7. Hold for __________ seconds. 8. Slowly return to the starting position. Repeat __________ times. Complete this exercise __________ times a day. Exercise K:Shoulder Press-Ups 1. Sit in a stable chair that has armrests. Sit upright, with your feet flat on the floor. 2. Put your hands on the armrests so your elbows are bent and your fingers are pointing forward. Your hands should be about even with the sides of your body. 3. Push down on the armrests and use your arms to lift yourself off of the chair. Straighten your elbows and lift yourself up as much as you comfortably can. ? Move your shoulder blades down, and avoid letting your shoulders move up toward your ears. ? Keep your feet on the ground. As you get stronger, your feet should support less of your body weight as you lift yourself up. 4. Hold for __________ seconds. 5. Slowly lower yourself back into the chair. Repeat __________ times. Complete  this exercise __________ times a day. Exercise L: Wall Push-Ups 1. Stand so you are facing a stable wall. Your feet should be about one arm-length away from the wall. 2. Lean forward and place your palms on the wall at shoulder height. 3. Keep your feet flat on the floor as you bend your elbows and lean forward toward the wall. 4. Hold for __________ seconds. 5. Straighten your elbows to push yourself back to the starting position. Repeat __________ times. Complete this exercise __________ times   a day. This information is not intended to replace advice given to you by your health care provider. Make sure you discuss any questions you have with your health care provider. Document Released: 09/04/2005 Document Revised: 02/24/2018 Document Reviewed: 07/02/2015 [employer] Interactive Patient Education  2019 [employer]  

## 2018-11-24 NOTE — Progress Notes (Signed)
Subjective:    Patient ID: Lori Mcgee, female    DOB: [birth year], 33 y.o.   MRN: [id number]  HPI  Pt presents to the clinic today with c/o right shoulder pain. She reports this started 1-2 weeks ago. She describes the pain as dull achy, sharp with palpation. The pain does not radiate. She denies neck pain, numbness, tingling or weakness of the right arm. She denies any injury to the area but does do cross-fit. She has iced her shoulder daily but had not taken any medications OTC.  She also needs a referral to her chiropractor, for insurance purposes.  Review of Systems      Past Medical History:  Diagnosis Date  . BV (bacterial vaginosis)     No current outpatient medications on file.   No current facility-administered medications for this visit.     No Known Allergies  Family History  Problem Relation Age of Onset  . Diabetes Father   . Hypertension Father   . Hypothyroidism Father   . Hypertension Mother   . Breast cancer Paternal Aunt        late 66's or early 49's    Social History   Socioeconomic History  . Marital status: Married    Spouse name: Not on file  . Number of children: Not on file  . Years of education: Not on file  . Highest education level: Not on file  Occupational History  . Not on file  Social Needs  . Financial resource strain: Not on file  . Food insecurity:    Worry: Not on file    Inability: Not on file  . Transportation needs:    Medical: Not on file    Non-medical: Not on file  Tobacco Use  . Smoking status: Never Smoker  . Smokeless tobacco: Never Used  Substance and Sexual Activity  . Alcohol use: No  . Drug use: No  . Sexual activity: Yes  Lifestyle  . Physical activity:    Days per week: Not on file    Minutes per session: Not on file  . Stress: Not on file  Relationships  . Social connections:    Talks on phone: Not on file    Gets together: Not on file    Attends religious service: Not on file    Active  member of club or organization: Not on file    Attends meetings of clubs or organizations: Not on file    Relationship status: Not on file  . Intimate partner violence:    Fear of current or ex partner: Not on file    Emotionally abused: Not on file    Physically abused: Not on file    Forced sexual activity: Not on file  Other Topics Concern  . Not on file  Social History Narrative   Married.   3 children.   Works in [employer].    Enjoys exercising and playing basketball.       Constitutional: Denies fever, malaise, fatigue, headache or abrupt weight changes.  Musculoskeletal: Pt reports right shoulder pain. Denies decrease in range of motion, difficulty with gait, muscle pain or joint swelling.  Skin: Denies redness, rashes, lesions or ulcercations.  Neurological: Denies numbness, tingling, weakness or problems with coordination.    No other specific complaints in a complete review of systems (except as listed in HPI above).  Objective:   Physical Exam  BP 116/74   Pulse 61   Temp 98 F (36.7  C) (Oral)   Wt 179 lb (81.2 kg)   LMP 11/02/2018   SpO2 98%   BMI 25.68 kg/m   Wt Readings from Last 3 Encounters:  11/24/18 179 lb (81.2 kg)  11/13/18 175 lb 11.2 oz (79.7 kg)  07/13/18 177 lb 8 oz (80.5 kg)    General: Appears her stated age, well developed, well nourished in NAD. Cardiovascular: Radial pulses 2+ bilaterally. Musculoskeletal: Normal external rotation of the right shoulder. Normal internal rotation of the right shoulder, but painful. Pain with palpation over the right anterior proximal biceps tendon. Negative drop can test. Strength 5/5 BUE. Handgrips equal.  Neurological: Alert and oriented. Sensation intact to BUE.   BMET    Component Value Date/Time   NA 138 04/13/2018 0911   NA 137 02/11/2013 1050   K 3.9 04/13/2018 0911   K 3.9 02/11/2013 1050   CL 104 04/13/2018 0911   CL 106 02/11/2013 1050   CO2 29 04/13/2018 0911   CO2 29  02/11/2013 1050   GLUCOSE 92 04/13/2018 0911   GLUCOSE 87 02/11/2013 1050   BUN 11 04/13/2018 0911   BUN 12 02/11/2013 1050   CREATININE 0.72 04/13/2018 0911   CREATININE 0.70 02/11/2013 1050   CALCIUM 9.3 04/13/2018 0911   CALCIUM 8.2 (L) 02/11/2013 1050   GFRNONAA >60 02/11/2013 1050   GFRAA >60 02/11/2013 1050    Lipid Panel     Component Value Date/Time   CHOL 162 04/13/2018 0911   TRIG 59.0 04/13/2018 0911   HDL 44.90 04/13/2018 0911   CHOLHDL 4 04/13/2018 0911   VLDL 11.8 04/13/2018 0911   LDLCALC 105 (H) 04/13/2018 0911    CBC    Component Value Date/Time   WBC 6.8 10/30/2016 1259   RBC 4.48 10/30/2016 1259   HGB 14.0 10/30/2016 1259   HGB 13.6 11/23/2013 1855   HCT 41.4 10/30/2016 1259   HCT 36.2 11/24/2013 0512   PLT 334 10/30/2016 1259   PLT 239 11/23/2013 1855   MCV 92.4 10/30/2016 1259   MCV 95 11/23/2013 1855   MCH 31.3 10/30/2016 1259   MCHC 33.9 10/30/2016 1259   RDW 13.1 10/30/2016 1259   RDW 13.5 11/23/2013 1855   LYMPHSABS 2.5 11/23/2013 1855   MONOABS 1.1 (H) 11/23/2013 1855   EOSABS 0.1 11/23/2013 1855   BASOSABS 0.1 11/23/2013 1855    Hgb A1C No results found for: HGBA1C          Assessment & Plan:   Right Shoulder Pain:  Concerning for anterior proximal biceps tendonitis Encouraged her to rest and avoid overuse No upper body at cross-fit x 1 week RX for Naproxen 500 mg BID x 1 week, with meals- avoid other OTC NSAID's Continue ice as needed for pain an inflammation If pain persists, consider xray vs PT  Chronic Right Sided Low Back Pain without Sciatica:  Referral to chiropractor for insurance purposes only.  Return precautions discussed Nicki Reaper, NP

## 2018-12-29 NOTE — Progress Notes (Signed)
Dr. Karleen Hampshire T. Cariana Karge, MD, CAQ Sports Medicine Primary Care and Sports Medicine [address] [place], [address] Phone: [phone] Fax: [phone]  12/30/2018  Patient: Lori Mcgee, MRN: [id number], DOB: Dec 19, 1985, 33 y.o.  Primary Physician:  Doreene Nest, NP   Chief Complaint  Patient presents with  . Shoulder Pain    Right   . Hip Pain    Right   Subjective:   Lori Mcgee is a 33 y.o. very pleasant female patient who presents with the following:  Crossfit athlete who presents with R shoulder pain > 1 month and some ongoing back vs. Hip pain.  Played basketball and R hip and some the R shoulder.  Sore a little bit run - much better today did do some foam rolling. Low back hurt a little bit.   Ran some about a month ago.  December was the last time doing crossfit. Kipping pull-ups, snatches.  Olympic lifting.   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  Labrum vs SLAP? On the R.  She does have a history of subluxation on the right when she was playing basketball, and when she was playing basketball in college.  She has not done this in a while.  She is never fully dislocated her shoulder and had to have it relocated.  She is never had any prior surgery.  She is having some anterior pain and some pain deep in the shoulder socket.  R hip - piriformis and lateral hip stabilizers.  She is having pain not in the true groin, none in the back, no radicular symptoms, no numbness or tingling, and she has some pain in the buttocks region in the middle portion as well as in the upper lateral aspect.  Past Medical History, Surgical History, Social History, Family History, Problem List, Medications, and Allergies have been reviewed and updated if relevant.  Patient Active Problem List   Diagnosis Date Noted  . Preventative health care 04/16/2018    Past Medical History:  Diagnosis Date  . BV (bacterial vaginosis)     Past Surgical History:  Procedure  Laterality Date  . HYSTEROSCOPY N/A 10/31/2016   Procedure: HYSTEROSCOPY WITH IUD REMOVAL;  Surgeon: Hildred Laser, MD;  Location: [facility] ORS;  Service: Gynecology;  Laterality: N/A;  . iud extraction  10/31/2016  . WISDOM TOOTH EXTRACTION      Social History   Socioeconomic History  . Marital status: Married    Spouse name: Not on file  . Number of children: Not on file  . Years of education: Not on file  . Highest education level: Not on file  Occupational History  . Not on file  Social Needs  . Financial resource strain: Not on file  . Food insecurity:    Worry: Not on file    Inability: Not on file  . Transportation needs:    Medical: Not on file    Non-medical: Not on file  Tobacco Use  . Smoking status: Never Smoker  . Smokeless tobacco: Never Used  Substance and Sexual Activity  . Alcohol use: No  . Drug use: No  . Sexual activity: Yes  Lifestyle  . Physical activity:    Days per week: Not on file    Minutes per session: Not on file  . Stress: Not on file  Relationships  . Social connections:    Talks on phone: Not on file    Gets together: Not on file    Attends religious  service: Not on file    Active member of club or organization: Not on file    Attends meetings of clubs or organizations: Not on file    Relationship status: Not on file  . Intimate partner violence:    Fear of current or ex partner: Not on file    Emotionally abused: Not on file    Physically abused: Not on file    Forced sexual activity: Not on file  Other Topics Concern  . Not on file  Social History Narrative   Married.   3 children.   Works in [employer].    Enjoys exercising and playing basketball.      Family History  Problem Relation Age of Onset  . Diabetes Father   . Hypertension Father   . Hypothyroidism Father   . Hypertension Mother   . Breast cancer Paternal Aunt        late 30's or early 40's    No Known Allergies  Medication list reviewed and  updated in full in [facility].  GEN: No fevers, chills. Nontoxic. Primarily MSK c/o today. MSK: Detailed in the HPI GI: tolerating PO intake without difficulty Neuro: No numbness, parasthesias, or tingling associated. Otherwise the pertinent positives of the ROS are noted above.   Objective:   BP (!) 90/50   Pulse 60   Temp 98.6 F (37 C) (Oral)   Ht 5' 8.75" (1.746 m)   Wt 176 lb 8 oz (80.1 kg)   LMP 11/30/2018 (Approximate)   BMI 26.25 kg/m    GEN: Well-developed,well-nourished,in no acute distress; alert,appropriate and cooperative throughout examination HEENT: Normocephalic and atraumatic without obvious abnormalities. Ears, externally no deformities PULM: Breathing comfortably in no respiratory distress EXT: No clubbing, cyanosis, or edema PSYCH: Normally interactive. Cooperative during the interview. Pleasant. Friendly and conversant. Not anxious or depressed appearing. Normal, full affect.  Range of motion at  the waist: Flexion: normal Extension: normal Lateral bending: normal Rotation: all normal  No echymosis or edema Rises to examination table with no difficulty Gait: non antalgic  Inspection/Deformity: N Paraspinus Tenderness: full  B Ankle Dorsiflexion (L5,4): 5/5 B Great Toe Dorsiflexion (L5,4): 5/5 Heel Walk (L5): WNL Toe Walk (S1): WNL Rise/Squat (L4): WNL  SENSORY B Medial Foot (L4): WNL B Dorsum (L5): WNL B Lateral (S1): WNL Light Touch: WNL Pinprick: WNL  REFLEXES Knee (L4): 2+ Ankle (S1): 2+  B SLR, seated: neg B SLR, supine: neg B FABER: neg B Reverse FABER: + on R B Greater Troch: NT B Log Roll: neg B Stork: NT B Sciatic Notch: NT   HIP EXAM: SIDE: R ROM: Abduction, Flexion, Internal and External range of motion: full Pain with terminal IROM and EROM: no GTB: NT SLR: NEG Knees: No effusion FABER: NT REVERSE FABER: pos Piriformis: TTP to direct palpation TFL stretch pain and glute medius  Str: flexion:  5/5 abduction: 5/5 adduction: 5/5 Strength testing non-tender  Shoulder: R Inspection: No muscle wasting or winging Ecchymosis/edema: neg  AC joint, scapula, clavicle: NT Cervical spine: NT, full ROM Abduction: full, 5/5 Flexion: full, 5/5 IR, full, lift-off: 5/5 ER at neutral: full, 5/5 AC crossover and compression: mild pos Neer: neg Hawkins: neg Drop Test: neg Empty Can: neg Supraspinatus insertion: NT Bicipital groove: NT Sulcus sign: neg Apprehension: POS O'Brien's: POS Jobe Relocation: POS Crank: POS Load and shift laxity: Scapular dyskinesis: mild R alterred mechanics  R lower trap and rhomboids 3+/5    Radiology: No results found.  Assessment and Plan:   Piriformis syndrome, right - Plan: Ambulatory referral to Physical Therapy  Labral tear of shoulder, right, initial encounter - Plan: Ambulatory referral to Physical Therapy  Scapular dyskinesis - Plan: Ambulatory referral to Physical Therapy  Tensor fascia lata syndrome - Plan: Ambulatory referral to Physical Therapy  >25 minutes spent in face to face time with patient, >50% spent in counselling or coordination of care   Clearly piriformis syndrome on the right.  She has some tensor fascia lata and gluteus minimus involvement as well.  I think that this will rehab quite well, and I gave her some piriformis rehab as well as some iliopsoas rehab.  Clinically, I really think that she has a labral tear in her shoulder with a positive clunk test, positive O'Brien's, and positive Jobe relocation test.  She understands the anatomy pretty well, and I think that she likely will do okay with good shoulder rehab, scapular stabilization long-term.  If she does poorly and has worsening symptoms, MR arthrogram or potentially operative intervention could always be considered.  Recommendation against kipping pull-ups, clean and jerk, and push presses and other similar ballistic movements with the uppers  extremities.  Follow-up: Return in about 7 weeks (around 02/17/2019).  Orders Placed This Encounter  Procedures  . Ambulatory referral to Physical Therapy    Signed,  Karleen Hampshire T. Christia Coaxum, MD   Outpatient Encounter Medications as of 12/30/2018  Medication Sig  . naproxen (NAPROSYN) 500 MG tablet Take 1 tablet (500 mg total) by mouth 2 (two) times daily with a meal. (Patient not taking: Reported on 12/30/2018)   No facility-administered encounter medications on file as of 12/30/2018.

## 2018-12-30 ENCOUNTER — Encounter: Payer: Self-pay | Admitting: Family Medicine

## 2018-12-30 ENCOUNTER — Ambulatory Visit (INDEPENDENT_AMBULATORY_CARE_PROVIDER_SITE_OTHER): Payer: No Typology Code available for payment source | Admitting: Family Medicine

## 2018-12-30 VITALS — BP 90/50 | HR 60 | Temp 98.6°F | Ht 68.75 in | Wt 176.5 lb

## 2018-12-30 DIAGNOSIS — G2589 Other specified extrapyramidal and movement disorders: Secondary | ICD-10-CM | POA: Diagnosis not present

## 2018-12-30 DIAGNOSIS — M6289 Other specified disorders of muscle: Secondary | ICD-10-CM

## 2018-12-30 DIAGNOSIS — S43431A Superior glenoid labrum lesion of right shoulder, initial encounter: Secondary | ICD-10-CM

## 2018-12-30 DIAGNOSIS — G5701 Lesion of sciatic nerve, right lower limb: Secondary | ICD-10-CM | POA: Diagnosis not present

## 2019-01-06 ENCOUNTER — Encounter: Payer: Self-pay | Admitting: Physical Therapy

## 2019-01-06 ENCOUNTER — Ambulatory Visit: Payer: No Typology Code available for payment source | Attending: Family Medicine

## 2019-01-06 DIAGNOSIS — M25551 Pain in right hip: Secondary | ICD-10-CM | POA: Insufficient documentation

## 2019-01-06 DIAGNOSIS — M6281 Muscle weakness (generalized): Secondary | ICD-10-CM | POA: Insufficient documentation

## 2019-01-06 NOTE — Patient Instructions (Signed)

## 2019-01-06 NOTE — Therapy (Signed)
[place] [facility] PHYSICAL AND SPORTS MEDICINE [address] [place], [address] Phone: [phone]   Fax:  [phone]  Physical Therapy Evaluation  Patient Details  Name: Lori Mcgee MRN: [id number] Date of Birth: 1986/09/15 Referring Provider (PT): Hannah Beat MD   Encounter Date: 01/06/2019  PT End of Session - 01/06/19 1019    Visit Number  1    Number of Visits  12    Date for PT Re-Evaluation  02/17/19    Authorization Type  [employer]    PT Start Time  [phone]    PT Stop Time  [phone]    PT Time Calculation (min)  55 min    Behavior During Therapy  [facility] for tasks assessed/performed       Past Medical History:  Diagnosis Date  . BV (bacterial vaginosis)     Past Surgical History:  Procedure Laterality Date  . HYSTEROSCOPY N/A 10/31/2016   Procedure: HYSTEROSCOPY WITH IUD REMOVAL;  Surgeon: Hildred Laser, MD;  Location: [facility] ORS;  Service: Gynecology;  Laterality: N/A;  . iud extraction  10/31/2016  . WISDOM TOOTH EXTRACTION      There were no vitals filed for this visit.   Subjective Assessment - 01/06/19 1127    Subjective  Pt reports since January she has had pain in her right posteriolateral gluteal area. Pt points to an area triangulated by the PSIS, Greater trochanter, and posterior iliac crest. Pt reports this to be aggravated with playing basketball and fitness running. Pt previously running 3-4x weekly for ftness, but had to give this up due to on going pain after each run. Pt also reports pain also flared up after playing pickup games of basketball for a few hours (twice weekly). Pain is never present or limiting during the actual activity, but more painful thereafer, particularly with prolonged sitting immediately after activity. Pt is able to ride bike [employer]) without limitation or exacerbation, but largely remains physically active for 'mental health management.' Pt played basketball through college and  after adn has been hsitorically very physically active. Pt is a mother of 3 kids, 9yo, 7yo, 5yo, denies issues with pelvic/hip pain peripartum. Pt reports Lumbar disc injury in college during a basketball game. Pt reports some unclear pain in pubic symphysis area with the end of her third pregnancy that resolved with childbirth. Pt denies clicking in hips, but has been able to cavitate hips since college. Pt started doing CrossFit in October 2019-2020, stopped because shoulder had been aggravated by activity.     Pertinent History  No prior history of this problem.     Limitations  Sitting;Other (comment)   running; ball sports   How long can you sit comfortably?  Prefers not to sit at baseline    How long can you stand comfortably?  unlimited    How long can you walk comfortably?  unlimited    Currently in Pain?  --   soreness more than pain; pain with active open-chain abduction         [facility] PT Assessment - 01/06/19 0001      Assessment   Medical Diagnosis  Right posterior hip pain    Referring Provider (PT)  Hannah Beat MD    Onset Date/Surgical Date  --   estimated 5-6 weeks ago   Hand Dominance  Right    Next MD Visit  --   Mid April 2020   Prior Therapy  None  Precautions   Precautions  None      Restrictions   Weight Bearing Restrictions  No      Balance Screen   Has the patient fallen in the past 6 months  No    Has the patient had a decrease in activity level because of a fear of falling?   No    Is the patient reluctant to leave their home because of a fear of falling?   No      Prior Function   Level of Independence  Independent    Vocation  Full time employment    Vocation Requirements  sitting     Leisure  running, fitness, cycling       Examination:   Seated MMT:  Hip fleixon: 5/5 bilat Hip horizontal ABDCT: 5/5 bilat Hip horizontal adduction: 5/5 bilat Right Hip IR 5/5 (symptomatic pain)*  Right Hip ER 5/5 Left hip ER/IR 5/5 Ankle DF: 5/5  bilat   Prone MMT:  Prone SLR: 4+/5 (~20 degrees bilat)  Prone bent knee raise: 4/5 bilat  Prone hip rotation: All 5/5, pain free bilat   Prone Hip ROM:  Prone Left hip: ER 70 degrees; IR 40 degrees ProneRight hip: ER degrees; IR 25 degrees  Sidelying MMT:  Rt hip Abdct: 4-/5          Objective measurements completed on examination: See above findings.   INTERVENTION THIS DATE: myofascial triggerpoint release -Right posterolateral glute medius (10 minutes) -Right SKTC stretch, 2x30sec (VC for gentle effort) -Right hooklying FABER Stretch 2x30sec  -Left sidelying Right hip ABDCT from slight elevation 2x10, tactile cues and education for pure sagittal plane motion without hip flexor contribution.      Trigger Point Dry Needling - 01/06/19 0001    Consent Given?  Yes    Education Handout Provided  Yes    Muscles Treated Back/Hip  Gluteus medius    Dry Needling Comments  Posterolateral portion, 165mmx0.60mm, double needle method   twtich response >30x; 12 total sitcks          PT Education - 01/06/19 1136    Education Details  Dry needling education; generla prognosis     Person(s) Educated  Patient    Methods  Explanation;Demonstration    Comprehension  Verbalized understanding;Returned demonstration;Need further instruction       PT Short Term Goals - 01/06/19 1145      PT SHORT TERM GOAL #1   Title  After 3 weeks pt will report 3 ways she is able to self manage symptoms at home;     Baseline  none at eval; educated on 2 stretches and activity modification at eval    Time  3    Period  Weeks    Status  New    Target Date  01/27/19      PT SHORT TERM GOAL #2   Title  After 3 weeks pt will report good compliant with hip strength HEP and demonstrate correct form.     Time  3    Period  Weeks    Status  New    Target Date  01/27/19        PT Long Term Goals - 01/06/19 1147      PT LONG TERM GOAL #1   Title  After 6 weeks pt will demonstrate 5/5  strength in all seated and prone hip testing, pain free.     Baseline  pain with seated Rt IR, and 4+/5 in bilat hip extension;  Sideying Abduciton painful and weak.     Time  6    Period  Weeks    Status  New    Target Date  02/17/19      PT LONG TERM GOAL #2   Title  After 6 weeks patient will report return to running at 50% volume without speedwork or race pace running, without exacerbation of pain.     Baseline  unable to run without pain for 2-3 days after     Time  6    Period  Weeks    Status  New    Target Date  02/17/19             Plan - 01/06/19 1024    Clinical Impression Statement  Pt reporting to OPPT for CC pain in the right posteriolateral pelvis associated with running and basketballing. Examination reveals taut bands in the glute medius (posterolateral portion) with reproduction of symptomatic pain during application of deep palpation/pressure, weakness in right hip abdct, pain with testing of Right Seated resisted hip internal rotation. Pt consents to dry needling, followed by deep myofascial release to address non-contractile tissue components, gentle stretching, which is added to HEP, and some gentle low level activation. Pt will benefit from skilled PT intervention to address impairments, deficits, and pain detailed in this note, to restore prior level of function in leisure/social/fitness activity.     Personal Factors and Comorbidities  Age;Education;Behavior Pattern;Social Background;Fitness;Past/Current Experience    Examination-Activity Limitations  Locomotion Level;Stand;Sleep;Sit    Examination-Participation Restrictions  Other   fitness actiivty; running and basketballing    Stability/Clinical Decision Making  Stable/Uncomplicated    Clinical Decision Making  Low    Rehab Potential  Excellent    PT Frequency  --   2x/week for 3 weeks, then 1x/week for3 weeks   PT Duration  6 weeks    PT Treatment/Interventions  ADLs/Self Care Home Management;Electrical  Stimulation;Moist Heat;Gait training;Stair training;Functional mobility training;Therapeutic activities;Therapeutic exercise;Balance training;Neuromuscular re-education;Dry needling;Manual techniques;Passive range of motion;Patient/family education    PT Next Visit Plan  Review goals, HEP; SLS on foam, SLS barefoot firm eyes closed; TPDN and STM of glute medius as needed; expand strengthening once ready.     PT Home Exercise Plan  STKC stretch Rt; Hooklying FABER stretch Rt; Lt sidelying Rt ABDCT    Consulted and Agree with Plan of Care  Patient       Patient will benefit from skilled therapeutic intervention in order to improve the following deficits and impairments:  Decreased knowledge of use of DME, Decreased strength, Impaired flexibility, Increased muscle spasms, Increased fascial restricitons, Pain  Visit Diagnosis: Pain in right hip  Muscle weakness (generalized)     Problem List Patient Active Problem List   Diagnosis Date Noted  . Preventative health care 04/16/2018   11:57 AM, 01/06/19 [name], PT, DPT Physical Therapist - [place] [phone] (Office)     Anjolina Byrer C 01/06/2019, 11:50 AM  [facility] PHYSICAL AND SPORTS MEDICINE [address] [place], [address] Phone: [phone]   Fax:  [phone]  Name: Lori Mcgee MRN: [id number] Date of Birth: Dec 28, 1985

## 2019-01-08 ENCOUNTER — Ambulatory Visit: Payer: No Typology Code available for payment source | Admitting: Physical Therapy

## 2019-01-11 ENCOUNTER — Encounter: Payer: Self-pay | Admitting: Physical Therapy

## 2019-01-13 ENCOUNTER — Ambulatory Visit: Payer: No Typology Code available for payment source | Admitting: Physical Therapy

## 2019-01-13 ENCOUNTER — Encounter: Payer: Self-pay | Admitting: Physical Therapy

## 2019-01-13 ENCOUNTER — Other Ambulatory Visit: Payer: Self-pay

## 2019-01-13 DIAGNOSIS — M25551 Pain in right hip: Secondary | ICD-10-CM | POA: Diagnosis not present

## 2019-01-13 NOTE — Therapy (Signed)
[place] [facility] PHYSICAL [facility] SPORTS MEDICINE [address] [place], [address] Phone: [phone]   Fax:  [phone]  Physical Therapy Treatment  Patient Details  Name: Lori Mcgee MRN: [id number] Date of Birth: 01-27-1986 Referring Provider (PT): Hannah Beat MD   Encounter Date: 01/13/2019  PT End of Session - 01/13/19 1323    Visit Number  2    Number of Visits  12    Date for PT Re-Evaluation  02/17/19    Authorization Type  [employer]    PT Start Time  0104    PT Stop Time  0145    PT Time Calculation (min)  41 min    Behavior During Therapy  [facility] for tasks assessed/performed       Past Medical History:  Diagnosis Date  . BV (bacterial vaginosis)     Past Surgical History:  Procedure Laterality Date  . HYSTEROSCOPY N/A 10/31/2016   Procedure: HYSTEROSCOPY WITH IUD REMOVAL;  Surgeon: Hildred Laser, MD;  Location: [facility] ORS;  Service: Gynecology;  Laterality: N/A;  . iud extraction  10/31/2016  . WISDOM TOOTH EXTRACTION      There were no vitals filed for this visit.  Subjective Assessment - 01/13/19 1305    Subjective  Patient is unable to rate pain on NPRS to date. Reports her pain is very sore after biking and running. When she sits she reports this "pinches in the front of the hip"    Pertinent History  No prior history of this problem.     Limitations  Sitting;Other (comment)    How long can you sit comfortably?  Prefers not to sit at baseline    How long can you stand comfortably?  unlimited    How long can you walk comfortably?  unlimited         Ther-Ex - Lateral step down 3x 06/13/09 with demo and TC initially for neutral knee position with good carry over following - Forward lunge with GTB giving abd activation with patient requiring demo and max cuing initially for proper form with good carry over following  - SL RDL 20# 3x 10/9/8 with demo and min cuing initially for proper form with good carry  over and patient reporting good muscle activation and fatigue   Manual - STM with trigger point release to glute min/med Following: (2) .30 needles placed along the R glute med/min to decrease increased muscular spasms and trigger points with the patient positioned in supine. Patient was educated on risks and benefits of therapy and verbally consents to PT.  - Manual obers stretch 3x 30sec hold - Manual glute stretch in supine 3x 30sec hold                   PT Education - 01/13/19 1322    Education Details  TDN education and exercise form    Person(s) Educated  Patient    Methods  Explanation;Demonstration;Tactile cues;Verbal cues    Comprehension  Verbalized understanding;Returned demonstration;Tactile cues required;Verbal cues required       PT Short Term Goals - 01/06/19 1145      PT SHORT TERM GOAL #1   Title  After 3 weeks pt will report 3 ways she is able to self manage symptoms at home;     Baseline  none at eval; educated on 2 stretches and activity modification at eval    Time  3    Period  Weeks  Status  New    Target Date  01/27/19      PT SHORT TERM GOAL #2   Title  After 3 weeks pt will report good compliant with hip strength HEP and demonstrate correct form.     Time  3    Period  Weeks    Status  New    Target Date  01/27/19        PT Long Term Goals - 01/06/19 1147      PT LONG TERM GOAL #1   Title  After 6 weeks pt will demonstrate 5/5 strength in all seated and prone hip testing, pain free.     Baseline  pain with seated Rt IR, and 4+/5 in bilat hip extension; Sideying Abduciton painful and weak.     Time  6    Period  Weeks    Status  New    Target Date  02/17/19      PT LONG TERM GOAL #2   Title  After 6 weeks patient will report return to running at 50% volume without speedwork or race pace running, without exacerbation of pain.     Baseline  unable to run without pain for 2-3 days after     Time  6    Period  Weeks     Status  New    Target Date  02/17/19            Plan - 01/13/19 1419    Clinical Impression Statement  Patient reports decreased pain and "less tension" following TDN. PT led patient through muscle activation exercises for hip abd/lateral stabilizaers. Patient is able to complete all therex with accuracy following PT demo and cuing for proper form and alignment. Following session patient reports no pain, only muscle soreness. PT encouraged patient to continue to utilize ice as needed to decrease pain.     Personal Factors and Comorbidities  Age;Education;Behavior Pattern;Social Background;Fitness;Past/Current Experience    Examination-Activity Limitations  Locomotion Level;Stand;Sleep;Sit    Examination-Participation Restrictions  Other    Stability/Clinical Decision Making  Stable/Uncomplicated    Rehab Potential  Excellent    PT Duration  6 weeks    PT Treatment/Interventions  ADLs/Self Care Home Management;Electrical Stimulation;Moist Heat;Gait training;Stair training;Functional mobility training;Therapeutic activities;Therapeutic exercise;Balance training;Neuromuscular re-education;Dry needling;Manual techniques;Passive range of motion;Patient/family education    PT Next Visit Plan  Review goals, HEP; SLS on foam, SLS barefoot firm eyes closed; TPDN and STM of glute medius as needed; expand strengthening once ready.     PT Home Exercise Plan  STKC stretch Rt; Hooklying FABER stretch Rt; Lt sidelying Rt ABDCT    Consulted and Agree with Plan of Care  Patient       Patient will benefit from skilled therapeutic intervention in order to improve the following deficits and impairments:  Decreased knowledge of use of DME, Decreased strength, Impaired flexibility, Increased muscle spasms, Increased fascial restricitons, Pain  Visit Diagnosis: Pain in right hip     Problem List Patient Active Problem List   Diagnosis Date Noted  . Preventative health care 04/16/2018    Staci Acosta 01/13/2019, 2:21 PM  [place] [facility] PHYSICAL AND SPORTS MEDICINE [address] [place], [address] Phone: [phone]   Fax:  [phone]  Name: Lori Mcgee MRN: [id number] Date of Birth: Sep 21, 1986

## 2019-01-14 ENCOUNTER — Other Ambulatory Visit: Payer: Self-pay

## 2019-01-19 ENCOUNTER — Ambulatory Visit: Payer: No Typology Code available for payment source | Admitting: Physical Therapy

## 2019-01-21 ENCOUNTER — Ambulatory Visit: Payer: No Typology Code available for payment source | Admitting: Physical Therapy

## 2019-01-21 ENCOUNTER — Other Ambulatory Visit: Payer: Self-pay | Admitting: Obstetrics and Gynecology

## 2019-01-27 ENCOUNTER — Ambulatory Visit: Payer: No Typology Code available for payment source | Admitting: Physical Therapy

## 2019-01-29 ENCOUNTER — Ambulatory Visit: Payer: No Typology Code available for payment source | Admitting: Physical Therapy

## 2019-02-03 ENCOUNTER — Encounter: Payer: No Typology Code available for payment source | Admitting: Physical Therapy

## 2019-02-05 ENCOUNTER — Encounter: Payer: No Typology Code available for payment source | Admitting: Physical Therapy

## 2019-02-07 ENCOUNTER — Encounter: Payer: Self-pay | Admitting: Physical Therapy

## 2019-02-07 NOTE — Therapy (Unsigned)
[place] [facility] PHYSICAL AND SPORTS MEDICINE [address], [place], [address] Phone: [phone]   Fax:  [phone]  Patient Details  Name: Lori Mcgee MRN: [id number] Date of Birth: 10-23-1986 Referring Provider:  No ref. provider found  Encounter Date: 02/07/2019 Spoke to patient. Reports hip is feeling better and her therex is going well. Says she is worried about trying to run and hurting herself and not being able to return to PT for pain management. PT advised patient to complete intervals of walking and light jogging 2-64mins at a time for and report back on how she felt following. Encouraged patient to wear supportive shoes and attempt to prevent "knee knocking" with running  Staci Acosta PT, DPT Staci Acosta 02/07/2019, 1:35 PM  [place] [facility] PHYSICAL AND SPORTS MEDICINE [address], [place], [address] Phone: [phone]   Fax:  [phone]

## 2019-02-10 ENCOUNTER — Encounter: Payer: Self-pay | Admitting: Physical Therapy

## 2019-02-12 ENCOUNTER — Encounter: Payer: No Typology Code available for payment source | Admitting: Physical Therapy

## 2019-02-15 ENCOUNTER — Telehealth: Payer: Self-pay | Admitting: Primary Care

## 2019-02-15 NOTE — Telephone Encounter (Signed)
Left message asking pt to call office please r/s appointmetn with dr copland

## 2019-02-16 ENCOUNTER — Encounter: Payer: Self-pay | Admitting: Physical Therapy

## 2019-02-16 NOTE — Therapy (Signed)
[place] [facility] MAIN [facility] SERVICES [address] [place], [place], [address] Phone: [phone]   Fax:  [phone]  Patient Details  Name: Lori Mcgee MRN: [id number] Date of Birth: 1986/10/23 Referring Provider:  No ref. provider found  Encounter Date: 02/16/2019  The patient has been contacted today in regards to telehealth services. The patient expressed an interest in participating in telehealth visits. Patient has been informed that an [facility] support representative will be reaching out to them to verify their insurance benefits and for scheduling    Myrene Galas, PT DPT 02/16/2019, 10:40 AM  [place] [facility] MAIN [facility] SERVICES [address] [place], [place], [address] Phone: [phone]   Fax:  [phone]

## 2019-02-17 ENCOUNTER — Ambulatory Visit: Payer: No Typology Code available for payment source | Admitting: Family Medicine

## 2019-02-19 ENCOUNTER — Encounter: Payer: No Typology Code available for payment source | Admitting: Physical Therapy

## 2019-02-24 ENCOUNTER — Encounter: Payer: Self-pay | Admitting: Physical Therapy

## 2019-02-26 ENCOUNTER — Encounter: Payer: No Typology Code available for payment source | Admitting: Physical Therapy

## 2019-03-03 ENCOUNTER — Encounter: Payer: Self-pay | Admitting: Physical Therapy

## 2019-03-05 ENCOUNTER — Encounter: Payer: Self-pay | Admitting: Physical Therapy

## 2019-05-13 ENCOUNTER — Other Ambulatory Visit: Payer: Self-pay

## 2019-05-13 ENCOUNTER — Encounter: Payer: Self-pay | Admitting: Obstetrics and Gynecology

## 2019-05-13 ENCOUNTER — Ambulatory Visit (INDEPENDENT_AMBULATORY_CARE_PROVIDER_SITE_OTHER): Payer: No Typology Code available for payment source | Admitting: Obstetrics and Gynecology

## 2019-05-13 VITALS — BP 133/74 | HR 67 | Ht 69.5 in | Wt 179.2 lb

## 2019-05-13 DIAGNOSIS — B379 Candidiasis, unspecified: Secondary | ICD-10-CM

## 2019-05-13 DIAGNOSIS — B9689 Other specified bacterial agents as the cause of diseases classified elsewhere: Secondary | ICD-10-CM | POA: Diagnosis not present

## 2019-05-13 DIAGNOSIS — N760 Acute vaginitis: Secondary | ICD-10-CM

## 2019-05-13 MED ORDER — FLUCONAZOLE 150 MG PO TABS: 150.0000 mg | ORAL_TABLET | Freq: Once | ORAL | 3 refills | Status: AC

## 2019-05-13 MED ORDER — CLINDAMYCIN PHOSPHATE 2 % VA CREA: 1.0000 | TOPICAL_CREAM | Freq: Every day | VAGINAL | 1 refills | Status: DC

## 2019-05-13 NOTE — Progress Notes (Signed)
  Subjective:     Patient ID: Lori Mcgee, female   DOB: February 04, 1986, 33 y.o.   MRN: [id number]  HPI Reports increased vaginal discharge for two months with intermittent foul odor. Denies vaginal irritation or itching, pain with sex (only partner is spouse) or changes in menses. Odor more noticeable later in the day.  Review of Systems  Genitourinary: Positive for vaginal discharge.  All other systems reviewed and are negative.      Objective:   Physical Exam A&Ox4 Well groomed female in no distress Blood pressure 133/74, pulse 67, height 5' 9.5" (1.765 m), weight 179 lb 4 oz (81.3 kg), last menstrual period 04/15/2019. Pelvic exam: VULVA: normal appearing vulva with no masses, tenderness or lesions, VAGINA: vaginal discharge - white, copious, curd-like and malodorous, CERVIX: cervical discharge present - white, creamy and malodorous, WET MOUNT done - results: clue cells, excessive bacteria, lactobacilli.    Assessment:     BV Yeast infection    Plan:     Counseled on findings. Prescribed clindesse nightly x 3 doses and prn. Then to take prescribed diflucan in 5 days.  RTC as needed.   Melody Shambley,CNM

## 2019-05-26 ENCOUNTER — Encounter: Payer: Self-pay | Admitting: Certified Nurse Midwife

## 2019-05-26 ENCOUNTER — Other Ambulatory Visit: Payer: Self-pay

## 2019-05-26 ENCOUNTER — Ambulatory Visit (INDEPENDENT_AMBULATORY_CARE_PROVIDER_SITE_OTHER): Payer: No Typology Code available for payment source | Admitting: Certified Nurse Midwife

## 2019-05-26 VITALS — BP 120/72 | HR 65 | Ht 69.5 in | Wt 180.3 lb

## 2019-05-26 DIAGNOSIS — N6320 Unspecified lump in the left breast, unspecified quadrant: Secondary | ICD-10-CM | POA: Diagnosis not present

## 2019-05-26 NOTE — Progress Notes (Signed)
GYN ENCOUNTER NOTE  Subjective:       Lori Mcgee is a 33 y.o. 123P3003 female is here for gynecologic evaluation of the following issues:  1. Tender left breast mass that she felt since yesterday. She denies recent trauma to the area. She admits to diet increase in caffeine. She denies history of breast cancer for her or immediate family. She has had a u/s and mammogram in the past for mass . She was supposed to have follow up in January but due to Covid has not been able to have it done. She has a history of fibrocystic breast tissue.    Gynecologic History Patient's last menstrual period was 05/25/2019 (exact date). Contraception: coitus interruptus Last Pap: 05/2017. Results were: normal Last mammogram: 10/21/17. Results were: Stable probable benign mass over the 3 o'clock position of the left breast 1 cm from the nipple measuring 0.4 x 0.7 x 1.1 cm  Obstetric History OB History  Gravida Para Term Preterm AB Living  3 3 3  0 0 3  SAB TAB Ectopic Multiple Live Births  0 0 0   3    # Outcome Date GA Lbr Len/2nd Weight Sex Delivery Anes PTL Lv  3 Term 11/23/13   7 lb 11 oz (3.487 kg) M Vag-Spont  N LIV  2 Term 11/01/11   6 lb 1 oz (2.75 kg) F Vag-Spont  N LIV  1 Term 02/03/09   8 lb 8 oz (3.856 kg) M Vag-Spont  N LIV    Past Medical History:  Diagnosis Date  . BV (bacterial vaginosis)     Past Surgical History:  Procedure Laterality Date  . HYSTEROSCOPY N/A 10/31/2016   Procedure: HYSTEROSCOPY WITH IUD REMOVAL;  Surgeon: Hildred LaserAnika Cherry, MD;  Location: [facility] ORS;  Service: Gynecology;  Laterality: N/A;  . iud extraction  10/31/2016  . WISDOM TOOTH EXTRACTION      No current outpatient medications on file prior to visit.   No current facility-administered medications on file prior to visit.     No Known Allergies  Social History   Socioeconomic History  . Marital status: Married    Spouse name: Not on file  . Number of children: Not on file  . Years of education: Not  on file  . Highest education level: Not on file  Occupational History  . Not on file  Social Needs  . Financial resource strain: Not on file  . Food insecurity    Worry: Not on file    Inability: Not on file  . Transportation needs    Medical: Not on file    Non-medical: Not on file  Tobacco Use  . Smoking status: Never Smoker  . Smokeless tobacco: Never Used  Substance and Sexual Activity  . Alcohol use: No  . Drug use: No  . Sexual activity: Yes    Birth control/protection: None  Lifestyle  . Physical activity    Days per week: Not on file    Minutes per session: Not on file  . Stress: Not on file  Relationships  . Social Musicianconnections    Talks on phone: Not on file    Gets together: Not on file    Attends religious service: Not on file    Active member of club or organization: Not on file    Attends meetings of clubs or organizations: Not on file    Relationship status: Not on file  . Intimate partner violence    Fear of current or  ex partner: Not on file    Emotionally abused: Not on file    Physically abused: Not on file    Forced sexual activity: Not on file  Other Topics Concern  . Not on file  Social History Narrative   Married.   3 children.   Works in [employer].    Enjoys exercising and playing basketball.      Family History  Problem Relation Age of Onset  . Diabetes Father   . Hypertension Father   . Hypothyroidism Father   . Hypertension Mother   . Breast cancer Paternal Aunt        late 7's or early 44's    The following portions of the patient's history were reviewed and updated as appropriate: allergies, current medications, past family history, past medical history, past social history, past surgical history and problem list.  Review of Systems Review of Systems - Negative except as mentioned in HPI Review of Systems - General ROS: negative for - chills, fatigue, fever, hot flashes, malaise or night sweats Hematological  and Lymphatic ROS: negative for - bleeding problems or swollen lymph nodes Gastrointestinal ROS: negative for - abdominal pain, blood in stools, change in bowel habits and nausea/vomiting Musculoskeletal ROS: negative for - joint pain, muscle pain or muscular weakness Genito-Urinary ROS: negative for - change in menstrual cycle, dysmenorrhea, dyspareunia, dysuria, genital discharge, genital ulcers, hematuria, incontinence, irregular/heavy menses, nocturia or pelvic pain  Objective:   BP 120/72   Pulse 65   Ht 5' 9.5" (1.765 m)   Wt 180 lb 5 oz (81.8 kg)   LMP 05/25/2019 (Exact Date)   BMI 26.25 kg/m  CONSTITUTIONAL: Well-developed, well-nourished female in no acute distress.  HENT:  Normocephalic, atraumatic.  NECK: Normal range of motion, supple, no masses.  Normal thyroid.  SKIN: Skin is warm and dry. No rash noted. Not diaphoretic. No erythema. No pallor. [place]: Alert and oriented to person, place, and time. PSYCHIATRIC: Normal mood and affect. Normal behavior. Normal judgment and thought content. CARDIOVASCULAR:Not Examined RESPIRATORY: Not Examined BREASTS: Breasts: right breast normal without mass, skin or nipple changes or axillary nodes. Fibrocystic tissue present bilaterally. Left breast: Mass @ 6 o'clock, 1 cm from nipple, firm mobil , smooth edges , redness and tender on palpation.  ABDOMEN: Soft, non distended; Non tender.  No Organomegaly. PELVIC:not examined  MUSCULOSKELETAL: Normal range of motion. No tenderness.  No cyanosis, clubbing, or edema.  Assessment:   Left breast mass   Plan:   Mammogram and u/s ordered. Recommend good supportive bra, warm compress , and tylenol/motrin for pain. Will follow up with results.   Philip Aspen, CNM

## 2019-05-26 NOTE — Patient Instructions (Signed)
Breast Cyst  A breast cyst is a sac in the breast that is filled with fluid. Breast cysts are usually noncancerous (benign). They are common among women, and they are most often located in the upper, outer portion of the breast. One or more cysts may develop. They form when fluid builds up inside of the breast glands. There are several types of breast cysts:  Macrocyst. This is a cyst that is about 2 inches (5.1 cm) across (in diameter).  Microcyst. This is a very small cyst that you cannot feel, but it can be seen with imaging tests such as an X-ray of the breast (mammogram) or ultrasound.  Galactocele. This is a cyst that contains milk. It may develop if you suddenly stop breastfeeding. Breast cysts do not increase your risk of breast cancer. They usually disappear after menopause, unless you take artificial hormones (are on hormone therapy). What are the causes? The exact cause of breast cysts is not known. Possible causes include:  Blockage of tubes (ducts) in the breast glands, which leads to fluid buildup. Duct blockage may result from: ? Fibrocystic breast changes. This is a common, benign condition that occurs when women go through hormonal changes during the menstrual cycle. This is a common cause of multiple breast cysts. ? Overgrowth of breast tissue or breast glands. ? Scar tissue in the breast from previous surgery.  Changes in certain female hormones (estrogen and progesterone). What increases the risk? You may be more likely to develop breast cysts if you have not gone through menopause. What are the signs or symptoms? Symptoms of a breast cyst may include:  Feeling one or more smooth, round, soft lumps (like grapes) in the breast that are easily moveable. The lump(s) may get bigger and more painful before your period and get smaller after your period.  Breast discomfort or pain. How is this diagnosed? A cyst can be felt during a physical exam by your health care provider.  A mammogram and ultrasound will be done to confirm the diagnosis. Fluid may be removed from the cyst with a needle (fine-needle aspiration) and tested to make sure the cyst is not cancerous. How is this treated? Treatment may not be necessary. Your health care provider may monitor the cyst to see if it goes away on its own. If the cyst is uncomfortable or gets bigger, or if you do not like how the cyst makes your breast look, you may need treatment. Treatment may include:  Hormone treatment.  Fine-needle aspiration, to drain fluid from the cyst. There is a chance of the cyst coming back (recurring) after aspiration.  Surgery to remove the cyst. Follow these instructions at home:  See your health care provider regularly. ? Get a yearly physical exam. ? If you are 20-40 years old, get a clinical breast exam every 1-3 years. After age 40, get this exam every year. ? Get mammograms as often as directed.  Do a breast self-exam every month, or as often as directed. Having many breast cysts, or "lumpy" breasts, may make it harder to feel for new lumps. Understand how your breasts normally look and feel, and write down any changes in your breasts so you can tell your health care provider about the changes. A breast self-exam involves: ? Comparing your breasts in the mirror. ? Looking for visible changes in your skin or nipples. ? Feeling for lumps or changes.  Take over-the-counter and prescription medicines only as told by your health care provider.  Wear   a supportive bra, especially when exercising.  Follow instructions from your health care provider about eating and drinking restrictions. ? Avoid caffeine. ? Cut down on salt (sodium) in what you eat and drink, especially before your menstrual period. Too much sodium can cause fluid buildup (retention), breast swelling, and discomfort.  Keep all follow-up visits as told your health care provider. This is important. Contact a health care  provider if:  You feel, or think you feel, a lump in your breast.  You notice that both breasts look or feel different than usual.  Your breast is still causing pain after your menstrual period is over.  You find new lumps or bumps that were not there before.  You feel lumps in your armpit (axilla). Get help right away if:  You have severe pain, tenderness, redness, or warmth in your breast.  You have fluid or blood leaking from your nipple.  Your breast lump becomes hard and painful.  You notice dimpling or wrinkling of the breast or nipple. This information is not intended to replace advice given to you by your health care provider. Make sure you discuss any questions you have with your health care provider. Document Released: 10/21/2005 Document Revised: 10/03/2017 Document Reviewed: 07/12/2016 Elsevier Patient Education  2020 Elsevier Inc.  

## 2019-06-01 ENCOUNTER — Ambulatory Visit
Admission: RE | Admit: 2019-06-01 | Discharge: 2019-06-01 | Disposition: A | Discharge status: Home / Self Care | Payer: No Typology Code available for payment source | Source: Ambulatory Visit | Attending: Obstetrics and Gynecology | Admitting: Obstetrics and Gynecology

## 2019-06-01 ENCOUNTER — Ambulatory Visit
Admission: RE | Admit: 2019-06-01 | Discharge: 2019-06-01 | Disposition: A | Discharge status: Home / Self Care | Payer: No Typology Code available for payment source | Source: Ambulatory Visit | Attending: Certified Nurse Midwife | Admitting: Certified Nurse Midwife

## 2019-06-01 DIAGNOSIS — N6320 Unspecified lump in the left breast, unspecified quadrant: Secondary | ICD-10-CM | POA: Diagnosis present

## 2019-06-01 DIAGNOSIS — N6002 Solitary cyst of left breast: Secondary | ICD-10-CM | POA: Insufficient documentation

## 2019-06-01 DIAGNOSIS — N6012 Diffuse cystic mastopathy of left breast: Secondary | ICD-10-CM | POA: Insufficient documentation

## 2019-06-01 DIAGNOSIS — N6011 Diffuse cystic mastopathy of right breast: Secondary | ICD-10-CM

## 2019-06-02 ENCOUNTER — Other Ambulatory Visit: Payer: Self-pay | Admitting: Obstetrics and Gynecology

## 2019-06-02 DIAGNOSIS — R928 Other abnormal and inconclusive findings on diagnostic imaging of breast: Secondary | ICD-10-CM

## 2019-06-02 DIAGNOSIS — N6002 Solitary cyst of left breast: Secondary | ICD-10-CM

## 2019-07-06 ENCOUNTER — Ambulatory Visit (INDEPENDENT_AMBULATORY_CARE_PROVIDER_SITE_OTHER)
Admission: RE | Admit: 2019-07-06 | Discharge: 2019-07-06 | Disposition: A | Discharge status: Home / Self Care | Payer: No Typology Code available for payment source | Source: Ambulatory Visit

## 2019-07-06 DIAGNOSIS — L298 Other pruritus: Secondary | ICD-10-CM | POA: Diagnosis not present

## 2019-07-06 DIAGNOSIS — L237 Allergic contact dermatitis due to plants, except food: Secondary | ICD-10-CM | POA: Diagnosis not present

## 2019-07-06 DIAGNOSIS — L299 Pruritus, unspecified: Secondary | ICD-10-CM

## 2019-07-06 MED ORDER — HYDROXYZINE HCL 25 MG PO TABS: 25.0000 mg | ORAL_TABLET | Freq: Four times a day (QID) | ORAL | 0 refills | Status: DC

## 2019-07-06 NOTE — Discharge Instructions (Signed)
Wash with warm water and mild soap Take methylprednisolone steroid pack as prescribed and to completion Prescribed hydroxyzine as needed for itching.  DO NOT TAKE while driving or operating heavy machinery You may use OTC claritin, zyrtec, and/or allegra as needed for daytime relief Follow up in person or go to the ED if you have any new or worsening symptoms such as fever, chills, nausea, vomiting, difficulty breathing, difficulty swallowing, throat/ mouth/ tongue swelling/ tingling/ tightness, drooling, symptoms do not continue to improve with medications, etc..Marland Kitchen

## 2019-07-06 NOTE — ED Provider Notes (Signed)
Lori Mcgee     Virtual Visit via Video Note:  Lori Mcgee  initiated request for Telemedicine visit with [facility] Urgent Care team. I connected with Lori Mcgee  on 07/06/2019 at 4:42 PM  for a synchronized telemedicine visit using a video enabled HIPPA compliant telemedicine application. I verified that I am speaking with Lori Mcgee  using two identifiers. Lori Box, PA-C  was physically located in a [facility] Urgent care site and Lori Mcgee was located at a different location.   The limitations of evaluation and management by telemedicine as well as the availability of in-person appointments were discussed. Patient was informed that she  may incur a bill ( including co-pay) for this virtual visit encounter. Lori Mcgee  expressed understanding and gave verbal consent to proceed with virtual visit.   [id number] 07/06/19 Arrival Time: 1631  CC: SKIN COMPLAINT  SUBJECTIVE:  Lori Mcgee is a 33 y.o. female who presents with a poison ivy rash that began 2 days ago.  Was seen by a provider and started on methylprednisolone dose pak.  Took first dose this morning, but reports some swelling around eyes.  Rash is diffuse about the body including torso, left arm, neck, and face.  Describes it as itchy, spreading, and weeping.  Symptoms are made worse with itching. Denies fever, chills, nausea, vomiting, oral manifestations (mouth, tongue, throat tightness/ swelling/ tingling), dyspnea, dysphagia, SOB, chest pain, abdominal pain, changes in bowel or bladder function.    ROS: As per HPI.  All other pertinent ROS negative.     Past Medical History:  Diagnosis Date  . BV (bacterial vaginosis)    Past Surgical History:  Procedure Laterality Date  . HYSTEROSCOPY N/A 10/31/2016   Procedure: HYSTEROSCOPY WITH IUD REMOVAL;  Surgeon: Rubie Maid, MD;  Location: [facility] ORS;  Service: Gynecology;  Laterality: N/A;  . iud extraction  10/31/2016  . WISDOM  TOOTH EXTRACTION     No Known Allergies No current facility-administered medications on file prior to encounter.    No current outpatient medications on file prior to encounter.    OBJECTIVE: There were no vitals filed for this visit.  General appearance: alert; no distress Eyes: EOMI grossly HENT: normocephalic; atraumatic Neck: supple with FROM Lungs: normal respiratory effort; speaking in full sentences without difficulty Extremities: moves extremities without difficulty Skin: areas of linear papules and vesicles with mild surrounding erythema to face and neck; coated with calamine lotion Neurologic: normal facial expressions Psychological: alert and cooperative; normal mood and affect  ASSESSMENT & PLAN:  1. Poison ivy dermatitis   2. Itching     Meds ordered this encounter  Medications  . hydrOXYzine (ATARAX/VISTARIL) 25 MG tablet    Sig: Take 1 tablet (25 mg total) by mouth every 6 (six) hours.    Dispense:  12 tablet    Refill:  0    Order Specific Question:   Supervising Provider    Answer:   Raylene Everts [[id number]]    Wash with warm water and mild soap Take methylprednisolone steroid pack as prescribed and to completion Prescribed hydroxyzine as needed for itching.  DO NOT TAKE while driving or operating heavy machinery You may use OTC claritin, zyrtec, and/or allegra as needed for daytime relief Follow up in person or go to the ED if you have any new or worsening symptoms such as fever, chills, nausea, vomiting, difficulty breathing, difficulty swallowing, throat/ mouth/ tongue swelling/ tingling/ tightness, drooling, symptoms do  not continue to improve with medications, etc...  I discussed the assessment and treatment plan with the patient. The patient was provided an opportunity to ask questions and all were answered. The patient agreed with the plan and demonstrated an understanding of the instructions.   The patient was advised to call back or seek an  in-person evaluation if the symptoms worsen or if the condition fails to improve as anticipated.  I provided 8 minutes of non-face-to-face time during this encounter.  Lori HardingBrittany Bobetta Korf, PA-C  07/06/2019 4:42 PM    Lori Mcgee, Lori Burright, PA-C 07/06/19 1647

## 2019-07-07 ENCOUNTER — Other Ambulatory Visit: Payer: Self-pay

## 2019-07-07 ENCOUNTER — Ambulatory Visit
Admission: EM | Admit: 2019-07-07 | Discharge: 2019-07-07 | Disposition: A | Discharge status: Home / Self Care | Payer: No Typology Code available for payment source

## 2019-07-07 DIAGNOSIS — R21 Rash and other nonspecific skin eruption: Secondary | ICD-10-CM

## 2019-07-07 MED ORDER — METHYLPREDNISOLONE SODIUM SUCC 125 MG IJ SOLR
60.0000 mg | Freq: Once | INTRAMUSCULAR | Status: AC
  Administered 2019-07-07: 60 mg via INTRAMUSCULAR

## 2019-07-07 NOTE — ED Provider Notes (Signed)
[facility] ____________________________________________  Time seen: Approximately 8:49 AM  I have reviewed the triage vital signs and the nursing notes.   HISTORY  Chief Complaint Poison Ivy   HPI Lori Mcgee is a 33 y.o. female presenting for evaluation of itchy rash.  Patient states that she has had a rash that has continued to spread for the last 3 days.  States for started on her right chest and spread to her arms and now face and torso.  Not painful but very itchy.  Disrupting sleep.  States she believes she came in contact with poison oak or poison ivy as she has had a similar reaction in the past, and states that she believes she may have picked it up from her dog.  Has been using calamine without resolution.  Did see a another provider and was started on a Medrol Dosepak 15-day course in which she has taken 1 day.  Also did a visit on line last night and was prescribed hydroxyzine but she has not yet picked up.  Denies other aggravating alleviating factors.  Denies other changes in foods, medicines, lotions, detergents or other contacts.  No recent cough, chest pain or shortness of breath or fevers.  Reports otherwise doing well.  Patient's last menstrual period was 06/16/2019 (within days).  Denies pregnancy. Doreene Nestlark, Katherine K, NP: PCP    Past Medical History:  Diagnosis Date  . BV (bacterial vaginosis)     Patient Active Problem List   Diagnosis Date Noted  . Preventative health care 04/16/2018    Past Surgical History:  Procedure Laterality Date  . HYSTEROSCOPY N/A 10/31/2016   Procedure: HYSTEROSCOPY WITH IUD REMOVAL;  Surgeon: Hildred LaserAnika Cherry, MD;  Location: [facility] ORS;  Service: Gynecology;  Laterality: N/A;  . iud extraction  10/31/2016  . WISDOM TOOTH EXTRACTION       No current facility-administered medications for this encounter.   Current Outpatient Medications:  .  methylPREDNISolone (MEDROL) 4 MG tablet, , Disp: , Rfl:  .  hydrOXYzine  (ATARAX/VISTARIL) 25 MG tablet, Take 1 tablet (25 mg total) by mouth every 6 (six) hours., Disp: 12 tablet, Rfl: 0  Allergies Patient has no known allergies.  Family History  Problem Relation Age of Onset  . Diabetes Father   . Hypertension Father   . Hypothyroidism Father   . Hypertension Mother   . Breast cancer Paternal Aunt        late 8330's or early 7240's    Social History Social History   Tobacco Use  . Smoking status: Never Smoker  . Smokeless tobacco: Never Used  Substance Use Topics  . Alcohol use: No  . Drug use: No    Review of Systems Constitutional: No fever ENT: No sore throat. Cardiovascular: Denies chest pain. Respiratory: Denies shortness of breath. Skin: Positive rash  ____________________________________________   PHYSICAL EXAM:  VITAL SIGNS: ED Triage Vitals  Enc Vitals Group     BP 07/07/19 0826 107/87     Pulse Rate 07/07/19 0826 73     Resp 07/07/19 0826 18     Temp 07/07/19 0826 98.2 F (36.8 C)     Temp Source 07/07/19 0826 Oral     SpO2 07/07/19 0826 99 %     Weight 07/07/19 0828 180 lb (81.6 kg)     Height --      Head Circumference --      Peak Flow --      Pain Score 07/07/19 0828 7  Pain Loc --      Pain Edu? --      Excl. in [place]? --     Constitutional: Alert and oriented. Well appearing and in no acute distress. Eyes: Conjunctivae are normal.  ENT      Head: Normocephalic and atraumatic. Cardiovascular:   Good peripheral circulation. Respiratory: Normal respiratory effort without tachypnea nor retractions.  Musculoskeletal: Steady gait.  No extremity edema noted. Neurologic:  Normal speech and language.  Skin:  Skin is warm, dry.  Except: Pruritic minimally erythematous scattered and clustered papular and vesicular rash to right arm, left arm, torso right neck and left face, no surrounding erythema, some serous drainage from right arm without erythema or purulence, pruritic, nontender. Psychiatric: Mood and affect are  normal. Speech and behavior are normal. Patient exhibits appropriate insight and judgment   ___________________________________________   LABS (all labs ordered are listed, but only abnormal results are displayed)  Labs Reviewed - No data to display  PROCEDURES Procedures    INITIAL IMPRESSION / ASSESSMENT AND PLAN / ED COURSE  Pertinent labs & imaging results that were available during my care of the patient were reviewed by me and considered in my medical decision making (see chart for details).  Well-appearing patient.  No acute distress.  Rash clinical appearance consistent with contact dermatitis.  Patient has had 1 day of Medrol Dosepak as well as has not yet filled the prescription for hydroxyzine.  60 mg IM Solu-Medrol given once in urgent care and encourage patient to continue her home medications.  Continue calamine lotion and support.  Avoid scratching and monitor.Discussed indication, risks and benefits of medications with patient.   Discussed follow up and return parameters including no resolution or any worsening concerns. Patient verbalized understanding and agreed to plan.   ____________________________________________   FINAL CLINICAL IMPRESSION(S) / ED DIAGNOSES  Final diagnoses:  Rash     ED Discharge Orders    None       Note: This dictation was prepared with Dragon dictation along with smaller phrase technology. Any transcriptional errors that result from this process are unintentional.         Marylene Land, NP 07/07/19 [phone]

## 2019-07-07 NOTE — ED Triage Notes (Signed)
Pt thinks she has poison ivy and had it for 3 days, unsure how she got it. Has a rash on her torso, arms and neck and states it is itching really bad. Does have calamine lotion on it. Did have virtual visit yesterday and was given lotion and a steroid but getting worse and they told her she may need a steroid injection.

## 2019-07-07 NOTE — Discharge Instructions (Signed)
Take home medication as prescribed. Avoid scratching. Monitor.  Follow up with your primary care physician this week as needed. Return to Urgent care for new or worsening concerns.

## 2019-07-16 ENCOUNTER — Other Ambulatory Visit: Payer: Self-pay

## 2019-07-16 ENCOUNTER — Encounter: Payer: Self-pay | Admitting: Family Medicine

## 2019-07-16 ENCOUNTER — Ambulatory Visit (INDEPENDENT_AMBULATORY_CARE_PROVIDER_SITE_OTHER): Payer: No Typology Code available for payment source | Admitting: Family Medicine

## 2019-07-16 VITALS — BP 118/72 | HR 69 | Temp 97.8°F | Ht 68.75 in | Wt 181.4 lb

## 2019-07-16 DIAGNOSIS — R21 Rash and other nonspecific skin eruption: Secondary | ICD-10-CM | POA: Diagnosis not present

## 2019-07-16 MED ORDER — TRIAMCINOLONE ACETONIDE 0.1 % EX CREA: 1.0000 "application " | TOPICAL_CREAM | Freq: Two times a day (BID) | CUTANEOUS | 0 refills | Status: DC

## 2019-07-16 MED ORDER — DEXAMETHASONE SODIUM PHOSPHATE 10 MG/ML IJ SOLN
10.0000 mg | Freq: Once | INTRAMUSCULAR | Status: AC
  Administered 2019-07-16: 10 mg via INTRAMUSCULAR

## 2019-07-16 NOTE — Patient Instructions (Signed)
Steroid shot today.  Finish dosepak.  Let me know if persistent new spots develop.  Use triamcinolone steroid cream to itchy spots on the skin.

## 2019-07-16 NOTE — Assessment & Plan Note (Addendum)
Presumed poison ivy dermatitis, persistent despite medrol dosepak, IM solumedrol, hydralazine and calomine. She may be getting ongoing exposure from her dog - planning to wash dog today. Has already washed all bedding/clothings. Will give another steroid injection (dexamethasone 10mg ) and add triamcinolone cream topically. She will let me know if persistent new lesions despite above to consider another oral steroid taper. Pt agrees with plan.

## 2019-07-16 NOTE — Progress Notes (Signed)
This visit was conducted in person.  BP 118/72 (BP Location: Left Arm, Patient Position: Sitting, Cuff Size: Normal)    Pulse 69    Temp 97.8 F (36.6 C) (Temporal)    Ht 5' 8.75" (1.746 m)    Wt 181 lb 6 oz (82.3 kg)    LMP 06/16/2019 (Within Days)    SpO2 99%    BMI 26.98 kg/m    CC: poison ivy Subjective:    Patient ID: Lori Mcgee, female    DOB: 1986-01-18, 33 y.o.   MRN: [id number]  HPI: Lori BeachRachel L Niziolek is a 33 y.o. female presenting on 07/16/2019 for Poison Ivy (C/o poison ivy rash about 2 wks ago.  Still taking Medrol but seems to have new areas of the rash. )   [facility], notes reviewed. Initially treated with methylprednisolone (medrol dosepak) pack, added hydroxyzine (overly sedated) but rash continued to spread so given solumedrol 60mg  IM x1. Continues calomine lotion. Has 4 more days of oral steroid course.   She may have gotten exposed from dog. Planning to bathe dog today.   Continues to wake up with new itchy bumps on arms and hands, persistent itchy rash on left side.   No new lotions detergents soaps or shampoos No new medicines, supplements.  No new foods.  No other rash at home.  No fevers/chills, nausea, joint pains, oral lesions     Relevant past medical, surgical, family and social history reviewed and updated as indicated. Interim medical history since our last visit reviewed. Allergies and medications reviewed and updated. Outpatient Medications Prior to Visit  Medication Sig Dispense Refill   methylPREDNISolone (MEDROL) 4 MG tablet      hydrOXYzine (ATARAX/VISTARIL) 25 MG tablet Take 1 tablet (25 mg total) by mouth every 6 (six) hours. 12 tablet 0   No facility-administered medications prior to visit.      Per HPI unless specifically indicated in ROS section below Review of Systems Objective:    BP 118/72 (BP Location: Left Arm, Patient Position: Sitting, Cuff Size: Normal)    Pulse 69    Temp 97.8  F (36.6 C) (Temporal)    Ht 5' 8.75" (1.746 m)    Wt 181 lb 6 oz (82.3 kg)    LMP 06/16/2019 (Within Days)    SpO2 99%    BMI 26.98 kg/m   Wt Readings from Last 3 Encounters:  07/16/19 181 lb 6 oz (82.3 kg)  07/07/19 180 lb (81.6 kg)  05/26/19 180 lb 5 oz (81.8 kg)    Physical Exam Vitals signs and nursing note reviewed.  Constitutional:      General: She is not in acute distress.    Appearance: Normal appearance. She is not ill-appearing.  Skin:    General: Skin is warm and dry.     Findings: Rash present.          Comments:  New bumps to L digits, L upper eyelid, upper chest Diffuse pruritic erythematous rash on left side at beltline Scarring to R upper inner arm at site of initial rash  Neurological:     Mental Status: She is alert.        Assessment & Plan:   Problem List Items Addressed This Visit    Rash/skin eruption - Primary    Presumed poison ivy dermatitis, persistent despite medrol dosepak, IM solumedrol, hydralazine and calomine. She may be getting ongoing exposure from her dog - planning  to wash dog today. Has already washed all bedding/clothings. Will give another steroid injection (dexamethasone 10mg ) and add triamcinolone cream topically. She will let me know if persistent new lesions despite above to consider another oral steroid taper. Pt agrees with plan.           Meds ordered this encounter  Medications   triamcinolone cream (KENALOG) 0.1 %    Sig: Apply 1 application topically 2 (two) times daily. Apply to AA.    Dispense:  453.6 g    Refill:  0   dexamethasone (DECADRON) injection 10 mg   No orders of the defined types were placed in this encounter.   Patient Instructions  Steroid shot today.  Finish dosepak.  Let me know if persistent new spots develop.  Use triamcinolone steroid cream to itchy spots on the skin.    Follow up plan: No follow-ups on file.  Ria Bush, MD

## 2019-07-19 ENCOUNTER — Ambulatory Visit (INDEPENDENT_AMBULATORY_CARE_PROVIDER_SITE_OTHER): Payer: No Typology Code available for payment source | Admitting: Family Medicine

## 2019-07-19 ENCOUNTER — Other Ambulatory Visit: Payer: Self-pay

## 2019-07-19 ENCOUNTER — Ambulatory Visit: Payer: No Typology Code available for payment source | Admitting: Family Medicine

## 2019-07-19 ENCOUNTER — Encounter: Payer: Self-pay | Admitting: Family Medicine

## 2019-07-19 DIAGNOSIS — M545 Low back pain, unspecified: Secondary | ICD-10-CM

## 2019-07-19 DIAGNOSIS — M25559 Pain in unspecified hip: Secondary | ICD-10-CM | POA: Insufficient documentation

## 2019-07-19 MED ORDER — MELOXICAM 15 MG PO TABS: 15.0000 mg | ORAL_TABLET | Freq: Every day | ORAL | 1 refills | Status: DC | PRN

## 2019-07-19 MED ORDER — METHOCARBAMOL 500 MG PO TABS: 500.0000 mg | ORAL_TABLET | Freq: Three times a day (TID) | ORAL | 1 refills | Status: DC | PRN

## 2019-07-19 NOTE — Assessment & Plan Note (Signed)
Exam consistent with spasm  No neuro symptoms Px meloxicam and methocarbamol (caution of sedation) Heat/ice intermittent  Walking Urgent ref to PT eval and tx  Disc red flags to call (neuro changes)

## 2019-07-19 NOTE — Patient Instructions (Signed)
Switch from ice to heat 10 minutes at a time  Walk slowly   meloxicam with food once daily  Do not take over the counter pain medicine with this   Methocarbamol -muscle relaxer as needed / caution of sedation   I placed an urgent physical therapy referral -our office will call you

## 2019-07-19 NOTE — Progress Notes (Signed)
Subjective:    Patient ID: Lori BeachRachel L Neale, female    DOB: Jun 19, 1986, 33 y.o.   MRN: [id number]  HPI  33 yo pt of NP Clark here for a back injury  Yesterday she was walking/carrying something  Stepped wrong and back hurt Sharp pain  L low back   Pain does not shoot down leg unless she moves just right   Laying on side with pillow between legs feels the best  Standing up straight is worse  Feels stiff   No urinary symptoms   She tried rubbing it  Also foam roller  Used ice  Ibuprofen-not very helpful   Has been on steroids for poison ivy dermatitis -takes 4 mg tomorrow and done  Patient Active Problem List   Diagnosis Date Noted  . Acute left-sided low back pain 07/19/2019  . Rash/skin eruption 07/16/2019  . Preventative health care 04/16/2018   Past Medical History:  Diagnosis Date  . BV (bacterial vaginosis)    Past Surgical History:  Procedure Laterality Date  . HYSTEROSCOPY N/A 10/31/2016   Procedure: HYSTEROSCOPY WITH IUD REMOVAL;  Surgeon: Hildred LaserAnika Cherry, MD;  Location: [facility] ORS;  Service: Gynecology;  Laterality: N/A;  . iud extraction  10/31/2016  . WISDOM TOOTH EXTRACTION     Social History   Tobacco Use  . Smoking status: Never Smoker  . Smokeless tobacco: Never Used  Substance Use Topics  . Alcohol use: No  . Drug use: No   Family History  Problem Relation Age of Onset  . Diabetes Father   . Hypertension Father   . Hypothyroidism Father   . Hypertension Mother   . Breast cancer Paternal Aunt        late 30's or early 40's   No Known Allergies Current Outpatient Medications on File Prior to Visit  Medication Sig Dispense Refill  . methylPREDNISolone (MEDROL) 4 MG tablet      No current facility-administered medications on file prior to visit.      Review of Systems  Constitutional: Negative for activity change, appetite change, fatigue, fever and unexpected weight change.  HENT: Negative for congestion, ear pain, rhinorrhea, sinus  pressure and sore throat.   Eyes: Negative for pain, redness and visual disturbance.  Respiratory: Negative for cough, shortness of breath and wheezing.   Cardiovascular: Negative for chest pain and palpitations.  Gastrointestinal: Negative for abdominal pain, blood in stool, constipation and diarrhea.  Endocrine: Negative for polydipsia and polyuria.  Genitourinary: Negative for dysuria, frequency and urgency.  Musculoskeletal: Positive for back pain. Negative for arthralgias and myalgias.  Skin: Negative for pallor and rash.  Allergic/Immunologic: Negative for environmental allergies.  Neurological: Negative for dizziness, syncope and headaches.  Hematological: Negative for adenopathy. Does not bruise/bleed easily.  Psychiatric/Behavioral: Negative for decreased concentration and dysphoric mood. The patient is not nervous/anxious.        Objective:   Physical Exam Constitutional:      General: She is not in acute distress.    Appearance: Normal appearance. She is well-developed and normal weight. She is not ill-appearing.  HENT:     Head: Normocephalic and atraumatic.  Eyes:     General: No scleral icterus.    Conjunctiva/sclera: Conjunctivae normal.     Pupils: Pupils are equal, round, and reactive to light.  Neck:     Musculoskeletal: Normal range of motion and neck supple.  Cardiovascular:     Rate and Rhythm: Normal rate and regular rhythm.  Pulmonary:  Effort: Pulmonary effort is normal.     Breath sounds: Normal breath sounds. No wheezing or rales.  Abdominal:     General: Bowel sounds are normal. There is no distension.     Palpations: Abdomen is soft.     Tenderness: There is no abdominal tenderness.  Musculoskeletal:        General: Tenderness present.     Right shoulder: She exhibits decreased range of motion, tenderness, pain and spasm. She exhibits no bony tenderness, no swelling, no crepitus, no deformity, normal pulse and normal strength.     Lumbar back:  She exhibits decreased range of motion, tenderness and spasm. She exhibits no bony tenderness and no edema.     Comments: Tender in L lumbar and piriformis musculature  Nl rom hip  Spine flex almost 90 deg Ext 10 deg with pain  Pain to lat bend R  Gait is guarded No neuro changes  Lymphadenopathy:     Cervical: No cervical adenopathy.  Skin:    General: Skin is warm and dry.     Coloration: Skin is not pale.     Findings: No erythema or rash.     Comments: Resolving poison ivy dermatitis noted  Neurological:     Mental Status: She is alert.     Cranial Nerves: No cranial nerve deficit.     Sensory: No sensory deficit.     Motor: No atrophy or abnormal muscle tone.     Coordination: Coordination normal.     Deep Tendon Reflexes: Reflexes are normal and symmetric. Reflexes normal.     Comments: Negative SLR  Psychiatric:        Mood and Affect: Mood normal.           Assessment & Plan:   Problem List Items Addressed This Visit      Other   Acute left-sided low back pain    Exam consistent with spasm  No neuro symptoms Px meloxicam and methocarbamol (caution of sedation) Heat/ice intermittent  Walking Urgent ref to PT eval and tx  Disc red flags to call (neuro changes)      Relevant Medications   meloxicam (MOBIC) 15 MG tablet   methocarbamol (ROBAXIN) 500 MG tablet   Other Relevant Orders   Ambulatory referral to Physical Therapy

## 2019-07-20 ENCOUNTER — Ambulatory Visit: Payer: No Typology Code available for payment source | Admitting: Family Medicine

## 2019-07-20 ENCOUNTER — Encounter: Payer: Self-pay | Admitting: Family Medicine

## 2019-07-21 ENCOUNTER — Other Ambulatory Visit: Payer: Self-pay

## 2019-07-21 ENCOUNTER — Encounter: Payer: Self-pay | Admitting: Primary Care

## 2019-07-21 ENCOUNTER — Ambulatory Visit: Payer: No Typology Code available for payment source | Attending: Family Medicine | Admitting: Physical Therapy

## 2019-07-21 DIAGNOSIS — M6281 Muscle weakness (generalized): Secondary | ICD-10-CM | POA: Diagnosis present

## 2019-07-21 DIAGNOSIS — M545 Low back pain, unspecified: Secondary | ICD-10-CM

## 2019-07-21 DIAGNOSIS — M25551 Pain in right hip: Secondary | ICD-10-CM | POA: Insufficient documentation

## 2019-07-21 NOTE — Therapy (Signed)
[facility] [address], [place], [address] Phone: [phone]   Fax:  [phone]  Physical Therapy Evaluation  Patient Details  Name: DUYEN BECKOM MRN: [id number] Date of Birth: [birth year] Referring Provider (PT): Tower, Wynelle Fanny, MD   Encounter Date: 07/21/2019  PT End of Session - 07/22/19 2002    Visit Number  1    Number of Visits  12    Date for PT Re-Evaluation  10/14/19    Authorization Type  [employer] reporting period from 07/21/2019    Authorization Time Period  Current cert period: 2/97/9892 - 10/14/2019    Authorization - Visit Number  1    Authorization - Number of Visits  10    PT Start Time  0945    PT Stop Time  1045    PT Time Calculation (min)  60 min    Activity Tolerance  Patient tolerated treatment well    Behavior During Therapy  [facility] for tasks assessed/performed       Past Medical History:  Diagnosis Date  . BV (bacterial vaginosis)     Past Surgical History:  Procedure Laterality Date  . HYSTEROSCOPY N/A 10/31/2016   Procedure: HYSTEROSCOPY WITH IUD REMOVAL;  Surgeon: Rubie Maid, MD;  Location: [facility] ORS;  Service: Gynecology;  Laterality: N/A;  . iud extraction  10/31/2016  . WISDOM TOOTH EXTRACTION      There were no vitals filed for this visit.   Subjective Assessment - 07/22/19 1345    Subjective  Patient states condition started Sunday 07/18/2019 when she stepped wrong while carrying something and it started shooting. Sitting bothers it, cannot twist or turn. Very tender to the touch. Patient reports she has no idea what happened. Has not had anything like this before. Getting better since Sunday. Prescribed meloxicam and robaxin that she started two days ago and it is hard to tell it is helping. Just completed a 15 day prednisone taper for poison ivy rash. At first states it doesn't change positions or intensity, but also reports agg and ease factors.  Denies any recent pregnancies but reports her period is late. Lifting is okay if she is careful ([place]). Notices it more if she is sitting in a way that hurts it (in a chair or any type of sitting).    Pertinent History  Patient is a 33 y.o. female who presents to outpatient physical therapy with a referral for medical diagnosis acute left-sided low back pain without sciatica. This patient's chief complaints consist of pain in the L glute and lumbar spine causing difficulty with motions involving lumbopelvic region leading to the following functional deficits: difficulty sitting, bending, lifting, twisting, transferring, prolonged standing, bed mobility. Patient reports sudden onset while stepping backwards 07/18/2019 and no prior history of this type of problem. Relevant past medical history and comorbidities include patient was recently treated for poison ivy that caused a rash over the left flank taking a 15 day taper of prednisone that ended yesterday. History of R hip pain earlier this year that has resolved.    Limitations  Sitting;Other (comment);House hold activities;Lifting   sitting, twisting, lumbar extension, anterior pelvic tilt, rolling in bed, getting in and out car, standing up and sitting down.   How long can you sit comfortably?  none    Diagnostic tests  none    Patient Stated Goals  resolve pain and return to prior level of activity  Currently in Pain?  Yes    Pain Score  5    best 5/10, at worst 10/10 (when it happened),   Pain Location  Back   L SIJ region, L leg feels tired and sore sometime like she has been working it out but she hasn't. Intermittently travels up to L lumbar region.   Pain Orientation  Left    Pain Descriptors / Indicators  Shooting   shooting initially, now "just there"   Pain Type  Acute pain    Pain Radiating Towards  low back to L glute. Denies numbness and tingling    Pain Onset  In the past 7 days    Pain Frequency  Constant     Aggravating Factors   sitting, twisting, lumbar extension, anterior pelvic tilt, rolling in bed, getting in and out car, standing up and sitting down.    Pain Relieving Factors  laying on side with pillow between legs to sleep (either side - tries to lay on R side), standing, walking, (Ice/heat hasn't helped at all.) 24-hour pattern: none.    Effect of Pain on Daily Activities  difficulty sitting, bending, lifting, twisting, transferring, prolonged standing, bed mobility, work activities      [facility] PT Assessment - 07/22/19 0001      Assessment   Medical Diagnosis  acute left-sided low back pain without sciatica    Referring Provider (PT)  Tower, Audrie Gallus, MD    Onset Date/Surgical Date  07/18/19    Hand Dominance  Right    Prior Therapy  none for this problem prior to this episode of care      Precautions   Precautions  None      Restrictions   Weight Bearing Restrictions  No      Prior Function   Level of Independence  Independent    Vocation  Full time 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 Still working with modifications.     Leisure  likes to be active so she is miserable right now. Workout, run, [employer], play with kids. 2-3 miles a couple of times a week (has not tried running), peliton bike.  Household, taking care of the kids, 50, 2, 9 years old.       Cognition   Overall Cognitive Status  Within Functional Limits for tasks assessed      Observation/Other Assessments   Observations  see note from 07/21/2019 for latest objective data    Focus on Therapeutic Outcomes (FOTO)   FOTO = 63 (07/21/2019);        OBJECTIVE: OBSERVATION/INSPECTION:  Patient presents with no obvious lateral shift, no gross abnormalities in posture.   Rash observed over L flank and lower abdomen consistent with previous dx of poison ivy rash.  Patient stood for entire subjective exam due to  avoid increased pain in sitting.   SPINE MOTION Lumbar AROM:  *Indicates pain - Flexion: = mid shins, slower than usual.  - Extension: = restricted, ERP - Rotation: R = WFL, L = WFL (feels a bit stiff and can feel it both ways) - Side Flexion: R = WFL except pulling, L = WFL   PERIPHERAL JOINT MOTION (AROM/PROM in degrees):  *Indicates pain - BLE appears grossly WFL for basic activities.   STRENGTH:  *Indicates pain - BLE = WFL except reproduction of pain with R hip extension initially at 3+/5, worst with knee  extended.   REPEATED MOTIONS TESTING:  Prone on elbows: 2x1 min, must stop due to pain getting sharper.   Prone press up 2x10 during = ERP, feels better than prone lying; after = centralized to SIJ region and mid low back.   Prone press up with hips off center to the R (to close L side) 3x10: during = feels better than straight prone press up; after = improved ability to extend R hip against gravity - not as painful. Centralized to near SIJ and low back, not abolished. Sit <> stand feels better than prior to procedure.  SPECIAL TESTS: SIJ provocation tests:   sacral thrust = negative,   posterior gapping = negative.   ACCESSORY MOTION:  - Painful to CPA at lowest lumbar segments, L5 most tender. Not TTP at scrum.   PALPATION: - TTP and tight at L piriformis, TTP along gluteal notch, upper gluteal muscles and L sided lumbar paraspinals.   FUNCTIONAL MOBILITY: - Bed mobility: rolling and supine <> sit I with slow and careful movements.  - Transfers: sit <> stand I but slow and painful - Gait: appears WFL for walking household and short community distances - Stairs: 4 steps WFL  FUNCTIONAL/BALANCE TESTS: Double leg hop: painful!   Objective measurements completed on examination: See above findings.     TREATMENT:  Therapeutic exercise: to centralize symptoms and improve ROM, strength, muscular endurance, and activity tolerance required for successful  completion of functional activities.   Prone on elbows: 2x1 min, must stop due to pain getting sharper.   Prone press up 2x10 during = ERP, feels better than prone lying; after = centralized to SIJ region and mid low back.   Prone press up with hips off center to the R (to close L side) 3x10: during = feels better than straight prone press up; after = improved ability to extend R hip against gravity - not as painful. Centralized to near SIJ and low back, not abolished. Sit <> stand feels better than prior to procedure.  Postural correction with lumbar roll  Education on diagnosis, prognosis, POC, anatomy and physiology of current condition.   Education on HEP including handout    Patient response to treatment:  Pt tolerated treatment well. Demonstrated improvement in pain, hip extension, and ability to complete sit <> stand transfer following repeated motions. Pt required multimodal cuing for proper technique and to facilitate improved neuromuscular control, strength, range of motion, and functional ability resulting in improved performance and form.    HOME EXERCISE PROGRAM Access Code: [email or web address]  URL: [email or web address]  Date: 07/21/2019  Prepared by: Norton BlizzardSara Antwonette Feliz   Exercises Prone Off Center Lumbar Extension Press Up - 10-15 reps - 1 second hold - 4-8x daily Seated Correct Posture  PT Education - 07/22/19 2002    Education Details  Exercise purpose/form. Self management techniques. Education on diagnosis, prognosis, POC, anatomy and physiology of current condition Education on HEP including handout    Person(s) Educated  Patient    Methods  Explanation;Demonstration;Tactile cues;Verbal cues;Handout    Comprehension  Verbalized understanding;Returned demonstration       PT Short Term Goals - 07/22/19 2005      PT SHORT TERM GOAL #1   Title  Be independent with initial home exercise program for self-management of symptoms.    Baseline  initial HEP provided at IE  (07/21/2019);    Time  2    Period  Weeks    Status  New    Target Date  08/05/19        PT Long Term Goals - 07/22/19 2006      PT LONG TERM GOAL #1   Title  Demonstrate improved FOTO score to equal or greater than 87 to demonstrate improvement in overall condition and self-reported functional ability.    Baseline  FOTO = 63 (07/21/2019);    Time  6    Period  Weeks    Status  New    Target Date  09/02/19      PT LONG TERM GOAL #2   Title  Have full lumbar AROM with no compensations or increase in pain in all planes except intermittent end range discomfort to allow patient to complete valued activities with less difficulty.    Baseline  painful, see objective exam (07/21/2019);    Time  6    Period  Weeks    Status  New    Target Date  09/02/19      PT LONG TERM GOAL #3   Title  Bilateral hip extension strength will be equal or greater than 4+/5 and pain free to improve ability to return to ADLs and fitness activities without difficulty.    Baseline  R hip extension initially at 3+/5, worst with knee extended (07/21/2019);    Time  6    Period  Weeks    Status  New    Target Date  09/02/19      PT LONG TERM GOAL #4   Title  Complete community, work and/or recreational activities without limitation due to current condition.    Baseline  currently unable to run or work out as usual, limiting lifting, etc (07/21/2019);    Time  6    Period  Weeks    Status  New    Target Date  09/02/19      PT LONG TERM GOAL #5   Title  Be independent with a long-term home exercise program for self-management of symptoms.    Baseline  initial HEP provided at IE (07/21/2019);    Time  6    Period  Weeks    Status  New    Target Date  09/02/19        Plan - 07/22/19 2015    Clinical Impression Statement  Patient is a 33 y.o. female referred to outpatient physical therapy with a medical diagnosis of acute left-sided low back pain without sciatica who presents with signs and symptoms  consistent with acute onset LBP with radiation to L glute region. Patient presents with significant pain, ROM, weakness, activity tolerance, knowledge about self-management impairments that are limiting ability to complete sitting, bending, lifting, twisting, transferring, prolonged standing, bed mobility, fitness routines including running and lifting weights without difficulty. Patient will benefit from skilled physical therapy intervention to address current body structure impairments and activity limitations to improve function and work towards goals set in current POC in order to return to prior level of function or maximal functional improvement.    Personal Factors and Comorbidities  Age;Education;Behavior Pattern;Social Background;Fitness;Past/Current Experience;Comorbidity 1    Comorbidities  history of R hip pain    Examination-Activity Limitations  Bend;Caring for Others;Carry;Lift;Transfers;Squat;Sit;Dressing;Stand;Bed Mobility   running, lifting weights   Examination-Participation Restrictions  Other;Community Activity;Interpersonal Relationship;Cleaning   work, farm Insurance account manager, fitness activities   Stability/Clinical Decision Making  Stable/Uncomplicated    Rehab Potential  Good    PT Frequency  2x / week    PT Duration  6 weeks    PT Treatment/Interventions  ADLs/Self Care Home Management;Cryotherapy;Electrical Stimulation;Moist Heat;Therapeutic activities;Therapeutic exercise;Balance training;Neuromuscular re-education;Patient/family education;Manual techniques;Manual lymph drainage;Dry needling;Passive range of motion;Spinal Manipulations;Joint Manipulations    PT Next Visit Plan  assess response to HEP and modify as approriate    PT Home Exercise Plan  Access Code: [email or web address]    Consulted and Agree with Plan of Care  Patient       Patient will benefit from skilled therapeutic intervention in order to improve the following deficits and impairments:  Decreased strength, Impaired  flexibility, Increased muscle spasms, Pain, Decreased activity tolerance, Decreased endurance, Decreased mobility, Difficulty walking, Impaired perceived functional ability, Increased fascial restricitons, Decreased range of motion  Visit Diagnosis: Acute left-sided low back pain without sciatica  Muscle weakness (generalized)     Problem List Patient Active Problem List   Diagnosis Date Noted  . Acute left-sided low back pain 07/19/2019  . Rash/skin eruption 07/16/2019  . Preventative health care 04/16/2018    Luretha MurphySara R. Ilsa IhaSnyder, PT, DPT 07/22/19, 8:22 PM   [facility] [address] [place], [place], [address] Phone: [phone]   Fax:  [phone]  Name: Hollie BeachRachel L Consalvo MRN: [id number] Date of Birth: 01-09-1986

## 2019-07-21 NOTE — Telephone Encounter (Signed)
I'll take over, thanks!

## 2019-07-22 ENCOUNTER — Encounter: Payer: Self-pay | Admitting: Physical Therapy

## 2019-07-26 ENCOUNTER — Encounter: Payer: Self-pay | Admitting: Physical Therapy

## 2019-07-26 ENCOUNTER — Other Ambulatory Visit: Payer: Self-pay

## 2019-07-26 ENCOUNTER — Ambulatory Visit: Payer: No Typology Code available for payment source | Admitting: Physical Therapy

## 2019-07-26 DIAGNOSIS — M545 Low back pain, unspecified: Secondary | ICD-10-CM

## 2019-07-26 DIAGNOSIS — M6281 Muscle weakness (generalized): Secondary | ICD-10-CM

## 2019-07-26 NOTE — Therapy (Signed)
[facility] [address] [place], [place], [address] Phone: [phone]   Fax:  [phone]  Physical Therapy Treatment  Patient Details  Name: Lori Mcgee MRN: [id number] Date of Birth: 1986/03/25 Referring Provider (PT): Tower, Audrie GallusMarne A, MD   Encounter Date: 07/26/2019  PT End of Session - 07/26/19 1741    Visit Number  2    Number of Visits  12    Date for PT Re-Evaluation  10/14/19    Authorization Type  [employer] reporting period from 07/21/2019    Authorization Time Period  Current cert period: 07/21/2019 - 10/14/2019    Authorization - Visit Number  2    Authorization - Number of Visits  10    PT Start Time  1650    PT Stop Time  1725    PT Time Calculation (min)  35 min    Activity Tolerance  Patient tolerated treatment well    Behavior During Therapy  [facility] for tasks assessed/performed       Past Medical History:  Diagnosis Date  . BV (bacterial vaginosis)     Past Surgical History:  Procedure Laterality Date  . HYSTEROSCOPY N/A 10/31/2016   Procedure: HYSTEROSCOPY WITH IUD REMOVAL;  Surgeon: Hildred LaserAnika Cherry, MD;  Location: [facility] ORS;  Service: Gynecology;  Laterality: N/A;  . iud extraction  10/31/2016  . WISDOM TOOTH EXTRACTION      There were no vitals filed for this visit.  Subjective Assessment - 07/26/19 1652    Subjective  Patient reports she is feeling well today and has no pain except mild L central low back pain with  movement. No referral to L glute at this point. States she has been completing her HEP 3 times a day with good results. States she attempted 3 hops over the weekend but it was too painful. Did do some supine weights with her arms. Was painful on Sunday after walking a lot on Saturday at the park with kids and doing errands. Has stopped taking meloxicam and feels she can feel pain more readily when it is present. States she did have a time when she did not have ERP with  prone press up on Sunday.    Pertinent History  Patient is a 33 y.o. female who presents to outpatient physical therapy with a referral for medical diagnosis acute left-sided low back pain without sciatica. This patient's chief complaints consist of pain in the L glute and lumbar spine causing difficulty with motions involving lumbopelvic region leading to the following functional deficits: difficulty sitting, bending, lifting, twisting, transferring, prolonged standing, bed mobility. Patient reports sudden onset while stepping backwards 07/18/2019 and no prior history of this type of problem. Relevant past medical history and comorbidities include patient was recently treated for poison ivy that caused a rash over the left flank taking a 15 day taper of prednisone that ended yesterday. History of R hip pain earlier this year that has resolved.    Limitations  Sitting;Other (comment);House hold activities;Lifting   sitting, twisting, lumbar extension, anterior pelvic tilt, rolling in bed, getting in and out car, standing up and sitting down.   How long can you sit comfortably?  none    Diagnostic tests  none    Patient Stated Goals  resolve pain and return to prior level of activity    Currently in Pain?  Other (Comment)   only with movement, mild and centralized to left low back  Pain Onset  In the past 7 days       TREATMENT:  Therapeutic exercise:to centralize symptoms and improve ROM, strength, muscular endurance, and activity tolerance required for successful completion of functional activities.    Prone press up x10 during = end range discomfort.   Prone press up with hips off center to R 3x10 (second and 3rd set with belt around hips pulling into further lateral flexion with clinician brace at legs) , 3x10 with hands elevated on yoga blocks, last 5 reps with lock and sag.  Education about using wall corner to help brace into further lateral flexion  Prone press up with hands on yoga  blocksx 10.   Side glide at wall (R hip to wall) x 10 but unable to achieve end range stretch, x10 with towel roll between wall and elbow to achieve end range stretch. Patient able to feel stretch well. Required some tactile cuing to prevent mild flexion.   MMT R hip extension - 4+/5 and not painful.   Back lunge x 5 each side - feels discomfort but no re-provocation of pain.   5 hops x 2 sets throughout session, can feel but not as bad as on Saturday.   Attempted push ups from knees, R and L plank elbow to toes. Patient demo motion at lumbar spine during push ups and increased lumbar flexion with side planks. Reports mild discomfort with push ups and pain in low back with R supporting plank.   Discussed possible trial of ambulation on incline using TM. Was going to attempt today until pain was provoked more than at any other time during session during prophylactic press up with hips off center following trials of non-specific activities.   HOME EXERCISE PROGRAM Access Code: [email or web address]       URL: [email or web address]    Date: 07/21/2019  Prepared by: Norton Blizzard   Exercises Prone Off Center Lumbar Extension Press Up - 10-15 reps - 1 second hold - 4-8x daily Seated Correct Posture    PT Education - 07/26/19 1741    Education Details  Exercise purpose/form. Self management techniques.    Person(s) Educated  Patient    Methods  Explanation;Demonstration;Tactile cues;Verbal cues    Comprehension  Verbalized understanding;Returned demonstration       PT Short Term Goals - 07/26/19 1757      PT SHORT TERM GOAL #1   Title  Be independent with initial home exercise program for self-management of symptoms.    Baseline  initial HEP provided at IE (07/21/2019);    Time  2    Period  Weeks    Status  Achieved    Target Date  08/05/19        PT Long Term Goals - 07/22/19 2006      PT LONG TERM GOAL #1   Title  Demonstrate improved FOTO score to equal or greater  than 87 to demonstrate improvement in overall condition and self-reported functional ability.    Baseline  FOTO = 63 (07/21/2019);    Time  6    Period  Weeks    Status  New    Target Date  09/02/19      PT LONG TERM GOAL #2   Title  Have full lumbar AROM with no compensations or increase in pain in all planes except intermittent end range discomfort to allow patient to complete valued activities with less difficulty.    Baseline  painful, see objective exam (07/21/2019);    Time  6    Period  Weeks    Status  New    Target Date  09/02/19      PT LONG TERM GOAL #3   Title  Bilateral hip extension strength will be equal or greater than 4+/5 and pain free to improve ability to return to ADLs and fitness activities without difficulty.    Baseline  R hip extension initially at 3+/5, worst with knee extended (07/21/2019);    Time  6    Period  Weeks    Status  New    Target Date  09/02/19      PT LONG TERM GOAL #4   Title  Complete community, work and/or recreational activities without limitation due to current condition.    Baseline  currently unable to run or work out as usual, limiting lifting, etc (07/21/2019);    Time  6    Period  Weeks    Status  New    Target Date  09/02/19      PT LONG TERM GOAL #5   Title  Be independent with a long-term home exercise program for self-management of symptoms.    Baseline  initial HEP provided at IE (07/21/2019);    Time  6    Period  Weeks    Status  New    Target Date  09/02/19            Plan - 07/26/19 1756    Clinical Impression Statement  Pt tolerated treatment well and is showing overall significant improvement in symptoms from initial eval. Patient's pain has centralized to left lumbar spine and she was able to complete R hip extension MMT without pain (provoked strongly at initial eva prior to initial treatment). Force was progress for extension preference with lateral component. Activities with neutral spineal position were  introduced but pt showed signs of irritation of condition so it was discontinued. Session ended when pain started increasing and plateaued suggesting patient's tissues had reached their tolerance level for input. Pt required multimodal cuing for proper technique and to facilitate improved neuromuscular control, strength, range of motion, and functional ability resulting in improved performance and form. Patient would benefit from continued physical therapy to address remaining impairments and functional limitations to work towards stated goals and return to PLOF or maximal functional independence    Personal Factors and Comorbidities  Age;Education;Behavior Pattern;Social Background;Fitness;Past/Current Experience;Comorbidity 1    Comorbidities  history of R hip pain    Examination-Activity Limitations  Bend;Caring for Others;Carry;Lift;Transfers;Squat;Sit;Dressing;Stand;Bed Mobility   running, lifting weights   Examination-Participation Restrictions  Other;Community Activity;Interpersonal Relationship;Cleaning   work, farm Insurance account manager, fitness activities   Stability/Clinical Decision Making  Stable/Uncomplicated    Rehab Potential  Good    PT Frequency  2x / week    PT Duration  6 weeks    PT Treatment/Interventions  ADLs/Self Care Home Management;Cryotherapy;Electrical Stimulation;Moist Heat;Therapeutic activities;Therapeutic exercise;Balance training;Neuromuscular re-education;Patient/family education;Manual techniques;Manual lymph drainage;Dry needling;Passive range of motion;Spinal Manipulations;Joint Manipulations    PT Next Visit Plan  assess response to HEP and modify as approriate    PT Home Exercise Plan  Access Code: [email or web address]    Consulted and Agree with Plan of Care  Patient       Patient will benefit from skilled therapeutic intervention in order to improve the following deficits and impairments:  Decreased strength, Impaired flexibility, Increased muscle spasms, Pain, Decreased activity  tolerance, Decreased endurance, Decreased mobility, Difficulty walking, Impaired perceived functional ability, Increased fascial restricitons, Decreased range of motion  Visit Diagnosis: Acute left-sided low back pain without sciatica  Muscle weakness (generalized)     Problem List Patient Active Problem List   Diagnosis Date Noted  . Acute left-sided low back pain 07/19/2019  . Rash/skin eruption 07/16/2019  . Preventative health care 04/16/2018    Everlean Alstrom. Graylon Good, PT, DPT 07/26/19, 5:57 PM  [place] [facility] AND SPORTS MEDICINE [address] [place], [address] Phone: [phone]   Fax:  [phone]  Name: Lori Mcgee MRN: [id number] Date of Birth: Jan 22, 1986

## 2019-07-29 ENCOUNTER — Other Ambulatory Visit: Payer: Self-pay

## 2019-07-29 ENCOUNTER — Encounter: Payer: Self-pay | Admitting: Physical Therapy

## 2019-07-29 ENCOUNTER — Ambulatory Visit: Payer: No Typology Code available for payment source | Admitting: Physical Therapy

## 2019-07-29 ENCOUNTER — Encounter: Payer: No Typology Code available for payment source | Admitting: Physical Therapy

## 2019-07-29 DIAGNOSIS — M545 Low back pain, unspecified: Secondary | ICD-10-CM

## 2019-07-29 DIAGNOSIS — M6281 Muscle weakness (generalized): Secondary | ICD-10-CM

## 2019-07-29 DIAGNOSIS — M25551 Pain in right hip: Secondary | ICD-10-CM

## 2019-07-29 NOTE — Therapy (Signed)
[place] [facility]IC[facility] [address], [place], [address] Phone: [phone]   Fax:  [phone]  Physical Therapy Treatment  Patient Details  Name: Lori Mcgee MRN: [id number] Date of Birth: Dec 12, 1985 Referring Provider (PT): Tower, Wynelle Fanny, MD   Encounter Date: 07/29/2019  PT End of Session - 07/29/19 0956    Visit Number  3    Number of Visits  12    Date for PT Re-Evaluation  10/14/19    Authorization Type  [employer] Employee reporting period from 07/21/2019    Authorization Time Period  Current cert period: 3/81/0175 - 10/14/2019    Authorization - Visit Number  3    Authorization - Number of Visits  10    PT Start Time  0905    PT Stop Time  0945    PT Time Calculation (min)  40 min    Activity Tolerance  Patient tolerated treatment well    Behavior During Therapy  [facility] for tasks assessed/performed       Past Medical History:  Diagnosis Date  . BV (bacterial vaginosis)     Past Surgical History:  Procedure Laterality Date  . HYSTEROSCOPY N/A 10/31/2016   Procedure: HYSTEROSCOPY WITH IUD REMOVAL;  Surgeon: Rubie Maid, MD;  Location: [facility] ORS;  Service: Gynecology;  Laterality: N/A;  . iud extraction  10/31/2016  . WISDOM TOOTH EXTRACTION      There were no vitals filed for this visit.  Subjective Assessment - 07/29/19 0904    Subjective  Patient reports she is feeling good today. She has mild soreness in the left L5-S1 region. She was mostly pain free yesterday and spent 1 hour sitting yesterday during work. She did not do her specific exercise yesterday and did some arm work this morning. she had no excessive soreness or pain following last treatment session.    Pertinent History  Patient is a 33 y.o. female who presents to outpatient physical therapy with a referral for medical diagnosis acute left-sided low back pain without sciatica. This patient's chief complaints consist of pain in the L glute  and lumbar spine causing difficulty with motions involving lumbopelvic region leading to the following functional deficits: difficulty sitting, bending, lifting, twisting, transferring, prolonged standing, bed mobility. Patient reports sudden onset while stepping backwards 07/18/2019 and no prior history of this type of problem. Relevant past medical history and comorbidities include patient was recently treated for poison ivy that caused a rash over the left flank taking a 15 day taper of prednisone that ended yesterday. History of R hip pain earlier this year that has resolved.    Limitations  Sitting;Other (comment);House hold activities;Lifting   sitting, twisting, lumbar extension, anterior pelvic tilt, rolling in bed, getting in and out car, standing up and sitting down.   How long can you sit comfortably?  none    Diagnostic tests  none    Patient Stated Goals  resolve pain and return to prior level of activity    Currently in Pain?  Yes    Pain Score  1     Pain Location  Back   L low back towards sacrum   Pain Orientation  Left    Pain Type  Acute pain    Pain Onset  1 to 4 weeks ago    Pain Frequency  Intermittent        TREATMENT: Therapeutic exercise:to centralize symptoms and improve ROM, strength, muscular endurance, and activity tolerance  required for successful completion of functional activities.   Prone press up with hips off center to R 4x10 without yoga blocks, 4x10 with yoga blocks at start and end of session and throughout session between exercises as a prophylaxis.  Treadmill at 3 mph up to 10% grade. For improved lower extremity mobility, muscular endurance, and weightbearing activity tolerance; and to induce the analgesic effect of aerobic exercise, stimulate improved joint nutrition, and prepare body structures and systems for following interventions. X 6 minutes.  Loch 5 (completed in this order, cuing for strong glute and abdominal contraction, to learn  exercise and form).   Loch clams x 25 reps each side. Plus additional reps and practice with set up for HEP.  Front plank (toes to elbows) 2x30 seconds.   Side plank (feet stacked) 2x15 seconds each side  Front plank (toes to hands) with shoulder taps, 2x10 each side (patient reports pain free at this point, with walking trial)  Prone hip extension with knee flexed to 90 degrees to bias glute max. (patient had mild increased pain at base of spine equal with each side working - increased cuing for decreased lumbar motion during exercise, no peripheralization).   Education on [employer] including handout and video on phone of [place] due to lack of availability on HEP program.   HOME EXERCISE PROGRAM Access Code: [email or web address] URL: [email or web address] Date: 07/21/2019  Prepared by: Norton Blizzard   Exercises Prone Off Center Lumbar Extension Press Up - 10-15 reps - 1 second hold - 4-8x daily Seated Correct Posture   HOME EXERCISE PROGRAM Access Code: [email or web address]  URL: [email or web address]  Date: 07/29/2019  Prepared by: Norton Blizzard   Program Notes  For all strenghtening exercises (new ones) Make sure you squeeze abdominal muscles and glutes during entire exercise. This is more important than actually completing the exercise.   Think of pinching a penny between the glutes to help activate properly.   Exercises  Prone Off Center Lumbar Extension Press Up - 10-15 reps - 1 second hold - 4-8x daily  Seated Correct Posture  Loch Clams - 1 sets - 25 reps - 1x daily - 7x weekly  Standard Plank - 2 reps - 30 seconds hold - 1x daily - 7x weekly  Side Plank on Elbow - 2 reps - 15 seconds hold - 1x daily - 7x weekly  Full Plank with Shoulder Taps - 2 sets - 10 reps - 1x daily - 7x weekly  Prone Hip Extension with Bent Knee - 3 sets - 10 reps - 1x daily - 7x weekly   PT Education - 07/29/19 0956    Education Details  Exercise purpose/form. Self  management techniques.    Person(s) Educated  Patient    Methods  Explanation;Demonstration;Tactile cues;Verbal cues;Handout    Comprehension  Verbalized understanding;Returned demonstration;Verbal cues required;Tactile cues required       PT Short Term Goals - 07/26/19 1757      PT SHORT TERM GOAL #1   Title  Be independent with initial home exercise program for self-management of symptoms.    Baseline  initial HEP provided at IE (07/21/2019);    Time  2    Period  Weeks    Status  Achieved    Target Date  08/05/19        PT Long Term Goals - 07/22/19 2006      PT LONG TERM GOAL #1   Title  Demonstrate improved FOTO score to equal or greater than 87 to demonstrate  improvement in overall condition and self-reported functional ability.    Baseline  FOTO = 63 (07/21/2019);    Time  6    Period  Weeks    Status  New    Target Date  09/02/19      PT LONG TERM GOAL #2   Title  Have full lumbar AROM with no compensations or increase in pain in all planes except intermittent end range discomfort to allow patient to complete valued activities with less difficulty.    Baseline  painful, see objective exam (07/21/2019);    Time  6    Period  Weeks    Status  New    Target Date  09/02/19      PT LONG TERM GOAL #3   Title  Bilateral hip extension strength will be equal or greater than 4+/5 and pain free to improve ability to return to ADLs and fitness activities without difficulty.    Baseline  R hip extension initially at 3+/5, worst with knee extended (07/21/2019);    Time  6    Period  Weeks    Status  New    Target Date  09/02/19      PT LONG TERM GOAL #4   Title  Complete community, work and/or recreational activities without limitation due to current condition.    Baseline  currently unable to run or work out as usual, limiting lifting, etc (07/21/2019);    Time  6    Period  Weeks    Status  New    Target Date  09/02/19      PT LONG TERM GOAL #5   Title  Be independent  with a long-term home exercise program for self-management of symptoms.    Baseline  initial HEP provided at IE (07/21/2019);    Time  6    Period  Weeks    Status  New    Target Date  09/02/19            Plan - 07/29/19 1105    Clinical Impression Statement  Patient tolerated treatment well and continues to make good progress towards goals. Patient reports decreasing pain levels and maintenance of more centralized pain location since last treatment session. Able to tolerate more previously aggravating activities including sitting for 1 hour. Today's session focused on progression to challenging abdominal and glute activation and overall improvement of strength/endurance. Patient was able to complete all activities without significant increase in pain and with acceptable form. However, she was unable to maintain strong glute and abdominal activation throughout exercises and required moderate cuing for improved form. Patient found hip extension exercise to be equally and mildly aggravating at the low back and she was cued for improved low back stability during this exercise. Plan to continue working on core, LE, and glute stability with gradual return to functional activities as tolerated at future visits. Patient would benefit from continued physical therapy to address remaining impairments and functional limitations to work towards stated goals and return to PLOF or maximal functional independence.    Personal Factors and Comorbidities  Age;Education;Behavior Pattern;Social Background;Fitness;Past/Current Experience;Comorbidity 1    Comorbidities  history of R hip pain    Examination-Activity Limitations  Bend;Caring for Others;Carry;Lift;Transfers;Squat;Sit;Dressing;Stand;Bed Mobility   running, lifting weights   Examination-Participation Restrictions  Other;Community Activity;Interpersonal Relationship;Cleaning   work, farm Insurance account managermanagement, fitness activities   Stability/Clinical Decision Making   Stable/Uncomplicated    Rehab Potential  Good    PT Frequency  2x / week  PT Duration  6 weeks    PT Treatment/Interventions  ADLs/Self Care Home Management;Cryotherapy;Electrical Stimulation;Moist Heat;Therapeutic activities;Therapeutic exercise;Balance training;Neuromuscular re-education;Patient/family education;Manual techniques;Manual lymph drainage;Dry needling;Passive range of motion;Spinal Manipulations;Joint Manipulations    PT Next Visit Plan  continue core, glute, and functional progression as tolerated. specific exercise for pain control    PT Home Exercise Plan  Access Code: [email or web address]    Consulted and Agree with Plan of Care  Patient       Patient will benefit from skilled therapeutic intervention in order to improve the following deficits and impairments:  Decreased strength, Impaired flexibility, Increased muscle spasms, Pain, Decreased activity tolerance, Decreased endurance, Decreased mobility, Difficulty walking, Impaired perceived functional ability, Increased fascial restricitons, Decreased range of motion  Visit Diagnosis: Acute left-sided low back pain without sciatica  Muscle weakness (generalized)  Pain in right hip     Problem List Patient Active Problem List   Diagnosis Date Noted  . Acute left-sided low back pain 07/19/2019  . Rash/skin eruption 07/16/2019  . Preventative health care 04/16/2018    Luretha Murphy. Ilsa Iha, PT, DPT 07/29/19, 11:07 AM  [facility] AND SPORTS MEDICINE [address], [place], [address] Phone: [phone]   Fax:  [phone]  Name: Lori Mcgee MRN: [id number] Date of Birth: 20-Jan-1986

## 2019-08-02 ENCOUNTER — Other Ambulatory Visit: Payer: Self-pay

## 2019-08-02 ENCOUNTER — Encounter: Payer: Self-pay | Admitting: Physical Therapy

## 2019-08-02 ENCOUNTER — Ambulatory Visit: Payer: No Typology Code available for payment source | Admitting: Physical Therapy

## 2019-08-02 DIAGNOSIS — M545 Low back pain, unspecified: Secondary | ICD-10-CM

## 2019-08-02 DIAGNOSIS — M6281 Muscle weakness (generalized): Secondary | ICD-10-CM

## 2019-08-02 DIAGNOSIS — M25551 Pain in right hip: Secondary | ICD-10-CM

## 2019-08-02 NOTE — Therapy (Signed)
[facility] [address] [place], [address] Phone: [phone]   Fax:  [phone]  Physical Therapy Treatment  Patient Details  Name: Lori Mcgee MRN: [id number] Date of Birth: 1986/07/11 Referring Provider (PT): Tower, Audrie Gallus, MD   Encounter Date: 08/02/2019  PT End of Session - 08/02/19 2001    Visit Number  4    Number of Visits  12    Date for PT Re-Evaluation  10/14/19    Authorization Type  [employer] reporting period from 07/21/2019    Authorization Time Period  Current cert period: 07/21/2019 - 10/14/2019    Authorization - Visit Number  4    Authorization - Number of Visits  10    PT Start Time  1851    PT Stop Time  2001    PT Time Calculation (min)  70 min    Activity Tolerance  Patient tolerated treatment well    Behavior During Therapy  [facility] for tasks assessed/performed       Past Medical History:  Diagnosis Date  . BV (bacterial vaginosis)     Past Surgical History:  Procedure Laterality Date  . HYSTEROSCOPY N/A 10/31/2016   Procedure: HYSTEROSCOPY WITH IUD REMOVAL;  Surgeon: Hildred Laser, MD;  Location: [facility] ORS;  Service: Gynecology;  Laterality: N/A;  . iud extraction  10/31/2016  . WISDOM TOOTH EXTRACTION      There were no vitals filed for this visit.  Subjective Assessment - 08/02/19 1859    Subjective  Patient states she has soreness 2/5 at L5-S1 due to exercises involved pylometric exercises. 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 Patient also states she went hiking over the weekend and felt no pain but the pain aggrevated due to her workout (jumping jacks and burpees)on Sunday. Was pretty sore in a central location following last treatment session. Did not do her specific exercise on Saturday.    Pertinent History  Patient is a 33 y.o. female who presents to outpatient physical therapy  with a referral for medical diagnosis acute left-sided low back pain without sciatica. This patient's chief complaints consist of pain in the L glute and lumbar spine causing difficulty with motions involving lumbopelvic region leading to the following functional deficits: difficulty sitting, bending, lifting, twisting, transferring, prolonged standing, bed mobility. Patient reports sudden onset while stepping backwards 07/18/2019 and no prior history of this type of problem. Relevant past medical history and comorbidities include patient was recently treated for poison ivy that caused a rash over the left flank taking a 15 day taper of prednisone that ended yesterday. History of R hip pain earlier this year that has resolved.    Limitations  Sitting;Other (comment);House hold activities;Lifting   sitting, twisting, lumbar extension, anterior pelvic tilt, rolling in bed, getting in and out car, standing up and sitting down.   How long can you sit comfortably?  none    Diagnostic tests  none    Patient Stated Goals  resolve pain and return to prior level of activity    Currently in Pain?  Yes    Pain Score  2     Pain Location  Back    Pain Orientation  Left;Lower    Pain Descriptors / Indicators  Sore    Pain Onset  1 to 4 weeks ago      TREATMENT: Therapeutic exercise:to centralize symptoms and  improve ROM, strength, muscular endurance, and activity tolerance required for successful completion of functional activities.   Prone press up with hips off center to 6x10 with yoga blocks at start and end of session and throughout session between exercises. As well as interspersed with manual therapy related to progression of force to improve condition.   Treadmill walking at 3.2 mph up to 10% grade. For improved lower extremity mobility, muscular endurance, and weightbearing activity tolerance; and to induce the analgesic effect of aerobic exercise, stimulate improved joint nutrition, and prepare  body structures and systems for following interventions. X 6 minutes.  Treadmill at 3. walking for 3x30 seconds; 4.7 mph jogging for 3x30s alternatively. Patient reports increased irritation at her lower back and patient returned to prone position with over pressure press up.  Standing lumbar extension (one rep each time) and Jogging 30 feet x 4 reps to check the symptoms of low back pain as well as effects from overpressure prone press ups.   Lock 5 (completed in this order, cuing for strong glute and abdominal contraction, and improved form).  ? Lock clams x 25 reps each side. Plus additional reps and practice with set up for HEP. ? Front plank (toes to elbows) 2x30 seconds.  ? Side plank (feet stacked) 2x15 seconds each side ? Front plank (toes to hands) with shoulder taps, 2x10 each side (patient reports pain free at this point, with walking trial)   Standing lunges 10 reps on each side. Bilateral knees cuing to keep pain free range flexion.  Educated on alternating method to perform overpressure prone press up at home by using towel and one person to help to stabilize.    Patient was educated on diagnosis, anatomy and pathology involved, prognosis, role of PT, and was given an HEP, demonstrating exercise with proper form following verbal and tactile cues.   Manual therapy: to reduce pain and tissue tension, improve range of motion, neuromodulation, in order to promote improved ability to complete functional activities. - prone L UPA at L4, L5 and over soft tissue overlying of L SIJ and upper L glutes.  - STM to L upper glutes and lumbar region.  - prone press up with hips off center to R with clinician CPA overpressure at L5, 3x10 to patient tolerance with lock and sag technique for increased effectiveness for several reps.  - prone press up with hips off center to R with left lumbar sideglide overpressure 2x10 to patient tolerance with lock and sag technique for increased  effectiveness for several reps.  HOME EXERCISE PROGRAM Access Code: [email or web address]       URL: [email or web address]    Date: 07/29/2019  Prepared by: Norton Blizzard   Program Notes  For all strenghtening exercises (new ones) Make sure you squeeze abdominal muscles and glutes during entire exercise. This is more important than actually completing the exercise.   Think of pinching a penny between the glutes to help activate properly.   Exercises  Prone Off Center Lumbar Extension Press Up - 10-15 reps - 1 second hold - 4-8x daily  Seated Correct Posture  Loch Clams - 1 sets - 25 reps - 1x daily - 7x weekly  Standard Plank - 2 reps - 30 seconds hold - 1x daily - 7x weekly  Side Plank on Elbow - 2 reps - 15 seconds hold - 1x daily - 7x weekly  Full Plank with Shoulder Taps - 2 sets - 10 reps - 1x daily - 7x weekly Prone Hip Extension  with Bent Knee - 3 sets - 10 reps - 1x daily - 7x weekly   Spent additional time talking with patient that was not included in billed time.  Patient tolerated the session well with minor increase of overall low back pain at the end of the session. She reported distinct discomfort with each L stance phase when jogging. Pain stayed centralized and reported improved comfort with jogging with progression of forces during prone press up with hips off center and manual techniques. Patient has responded well with overpressure press ups for pain control. Patient also performed good forms and techniques with HEP. However, patient still has pains at low back when jogging and higher level exercises involving jumping motions. She demonstrated increased stiffness to lumbar extension in standing following treadmill walking/jogging and lunges, but lost no motion in prone position. Lumbar extension in standing improved dramatically after further prone extension exercises and manual. Plan to continue working on core, LE, and glute stability with gradual return to  functional activities as tolerated at future visits. Patient would benefit from continued physical therapy to address remaining impairments and functional limitations to work towards stated goals and return to PLOF or maximal functional independence.     PT Education - 08/02/19 2000    Education Details  Exercise purpose/form. Self management techniques.    Person(s) Educated  Patient    Methods  Explanation;Demonstration;Tactile cues;Verbal cues    Comprehension  Verbalized understanding;Returned demonstration;Verbal cues required;Tactile cues required       PT Short Term Goals - 07/26/19 1757      PT SHORT TERM GOAL #1   Title  Be independent with initial home exercise program for self-management of symptoms.    Baseline  initial HEP provided at IE (07/21/2019);    Time  2    Period  Weeks    Status  Achieved    Target Date  08/05/19        PT Long Term Goals - 07/22/19 2006      PT LONG TERM GOAL #1   Title  Demonstrate improved FOTO score to equal or greater than 87 to demonstrate improvement in overall condition and self-reported functional ability.    Baseline  FOTO = 63 (07/21/2019);    Time  6    Period  Weeks    Status  New    Target Date  09/02/19      PT LONG TERM GOAL #2   Title  Have full lumbar AROM with no compensations or increase in pain in all planes except intermittent end range discomfort to allow patient to complete valued activities with less difficulty.    Baseline  painful, see objective exam (07/21/2019);    Time  6    Period  Weeks    Status  New    Target Date  09/02/19      PT LONG TERM GOAL #3   Title  Bilateral hip extension strength will be equal or greater than 4+/5 and pain free to improve ability to return to ADLs and fitness activities without difficulty.    Baseline  R hip extension initially at 3+/5, worst with knee extended (07/21/2019);    Time  6    Period  Weeks    Status  New    Target Date  09/02/19      PT LONG TERM GOAL #4    Title  Complete community, work and/or recreational activities without limitation due to current condition.    Baseline  currently  unable to run or work out as usual, limiting lifting, etc (07/21/2019);    Time  6    Period  Weeks    Status  New    Target Date  09/02/19      PT LONG TERM GOAL #5   Title  Be independent with a long-term home exercise program for self-management of symptoms.    Baseline  initial HEP provided at IE (07/21/2019);    Time  6    Period  Weeks    Status  New    Target Date  09/02/19            Plan - 08/02/19 2003    Clinical Impression Statement  Patient tolerated the session well with minor increase of overall low back pain at the end of the session. She reported distinct discomfort with each L stance phase when jogging. Pain stayed centralized and reported improved comfort with jogging with progression of forces during prone press up with hips off center and manual techniques. Patient has responded well with overpressure press ups for pain control. Patient also performed good forms and techniques with HEP. However, patient still has pains at low back when jogging and higher level exercises involving jumping motions. She demonstrated increased stiffness to lumbar extension in standing following treadmill walking/jogging and lunges, but lost no motion in prone position. Lumbar extension in standing improved dramatically after further prone extension exercises and manual. Plan to continue working on core, LE, and glute stability with gradual return to functional activities as tolerated at future visits. Patient would benefit from continued physical therapy to address remaining impairments and functional limitations to work towards stated goals and return to PLOF or maximal functional independence.    Personal Factors and Comorbidities  Age;Education;Behavior Pattern;Social Background;Fitness;Past/Current Experience;Comorbidity 1    Comorbidities  history of R hip  pain    Examination-Activity Limitations  Bend;Caring for Others;Carry;Lift;Transfers;Squat;Sit;Dressing;Stand;Bed Mobility   running, lifting weights   Examination-Participation Restrictions  Other;Community Activity;Interpersonal Relationship;Cleaning   work, farm Insurance account managermanagement, fitness activities   Stability/Clinical Decision Making  Stable/Uncomplicated    Rehab Potential  Good    PT Frequency  2x / week    PT Duration  6 weeks    PT Treatment/Interventions  ADLs/Self Care Home Management;Cryotherapy;Electrical Stimulation;Moist Heat;Therapeutic activities;Therapeutic exercise;Balance training;Neuromuscular re-education;Patient/family education;Manual techniques;Manual lymph drainage;Dry needling;Passive range of motion;Spinal Manipulations;Joint Manipulations    PT Next Visit Plan  continue core, glute, and functional progression as tolerated. specific exercise for pain control    PT Home Exercise Plan  Access Code: [email or web address]    Consulted and Agree with Plan of Care  Patient       Patient will benefit from skilled therapeutic intervention in order to improve the following deficits and impairments:  Decreased strength, Impaired flexibility, Increased muscle spasms, Pain, Decreased activity tolerance, Decreased endurance, Decreased mobility, Difficulty walking, Impaired perceived functional ability, Increased fascial restricitons, Decreased range of motion  Visit Diagnosis: Acute left-sided low back pain without sciatica  Muscle weakness (generalized)  Pain in right hip     Problem List Patient Active Problem List   Diagnosis Date Noted  . Acute left-sided low back pain 07/19/2019  . Rash/skin eruption 07/16/2019  . Preventative health care 04/16/2018   Nelly Routan Roza Creamer, SPT 08/03/19, 9:57 AM  Luretha MurphySara R. Ilsa IhaSnyder, PT, DPT 08/03/19, 9:57 AM   [facility] [address] [place], [place], [id number] Phone: [phone]   Fax:   [phone]  Name: Lori ContrasRachel  SIHAAM Mcgee MRN: [id number] Date of Birth: [birth year]

## 2019-08-03 ENCOUNTER — Encounter: Payer: No Typology Code available for payment source | Admitting: Physical Therapy

## 2019-08-04 ENCOUNTER — Encounter: Payer: No Typology Code available for payment source | Admitting: Physical Therapy

## 2019-08-05 ENCOUNTER — Encounter: Payer: Self-pay | Admitting: Physical Therapy

## 2019-08-05 ENCOUNTER — Other Ambulatory Visit: Payer: Self-pay

## 2019-08-05 ENCOUNTER — Ambulatory Visit: Payer: No Typology Code available for payment source | Attending: Family Medicine | Admitting: Physical Therapy

## 2019-08-05 DIAGNOSIS — M545 Low back pain, unspecified: Secondary | ICD-10-CM

## 2019-08-05 DIAGNOSIS — M25551 Pain in right hip: Secondary | ICD-10-CM | POA: Insufficient documentation

## 2019-08-05 DIAGNOSIS — M6281 Muscle weakness (generalized): Secondary | ICD-10-CM | POA: Diagnosis present

## 2019-08-05 NOTE — Therapy (Signed)
[facility] [address], [place], [address] Phone: [phone]   Fax:  [phone]  Physical Therapy Treatment  Patient Details  Name: Lori Mcgee MRN: [id number] Date of Birth: Mar 18, 1986 Referring Provider (PT): Milinda Antis, Audrie Gallus, MD   Encounter Date: 08/05/2019  PT End of Session - 08/05/19 0907    Visit Number  5    Number of Visits  12    Date for PT Re-Evaluation  10/14/19    Authorization Type  [employer] reporting period from 07/21/2019    Authorization Time Period  Current cert period: 07/21/2019 - 10/14/2019    Authorization - Visit Number  5    Authorization - Number of Visits  10    PT Start Time  0903    PT Stop Time  1002    PT Time Calculation (min)  59 min    Activity Tolerance  Patient tolerated treatment well    Behavior During Therapy  [facility] for tasks assessed/performed       Past Medical History:  Diagnosis Date  . BV (bacterial vaginosis)     Past Surgical History:  Procedure Laterality Date  . HYSTEROSCOPY N/A 10/31/2016   Procedure: HYSTEROSCOPY WITH IUD REMOVAL;  Surgeon: Hildred Laser, MD;  Location: [facility] ORS;  Service: Gynecology;  Laterality: N/A;  . iud extraction  10/31/2016  . WISDOM TOOTH EXTRACTION      There were no vitals filed for this visit.  Subjective Assessment - 08/05/19 0911    Subjective  Patient states she has soreness 2/10 at L5-S1. Patient has exercise induced soreness since the last session. Patient also did LE workout yesterday which may contribute to the soreness as well. Patient states she still have difficulites with prolonged sitting and picking up her child from floor (50lb)    Pertinent History  Patient is a 33 y.o. female who presents to outpatient physical therapy with a referral for medical diagnosis acute left-sided low back pain without sciatica. This patient's chief complaints consist of pain in the L glute and lumbar spine causing  difficulty with motions involving lumbopelvic region leading to the following functional deficits: difficulty sitting, bending, lifting, twisting, transferring, prolonged standing, bed mobility. Patient reports sudden onset while stepping backwards 07/18/2019 and no prior history of this type of problem. Relevant past medical history and comorbidities include patient was recently treated for poison ivy that caused a rash over the left flank taking a 15 day taper of prednisone that ended yesterday. History of R hip pain earlier this year that has resolved.    Limitations  Sitting;Other (comment);House hold activities;Lifting   sitting, twisting, lumbar extension, anterior pelvic tilt, rolling in bed, getting in and out car, standing up and sitting down.   How long can you sit comfortably?  none    Diagnostic tests  none    Patient Stated Goals  resolve pain and return to prior level of activity    Currently in Pain?  Yes    Pain Score  2     Pain Location  Back    Pain Orientation  Left;Lower    Pain Onset  1 to 4 weeks ago       TREATMENT: Therapeutic exercise:to centralize symptoms and improve ROM, strength, muscular endurance, and activity tolerance required for successful completion of functional activities.  Treadmillwalking at3.2 mphup to10%grade.For improved lower extremity mobility, muscular endurance, and weightbearing activity tolerance; and to induce the analgesic effect of aerobic  exercise, stimulate improved joint nutrition, and prepare body structures and systems for following interventions.X 5 minutes. Patient requires to have a phone call break. ([phone])  Prone press up with hips off center to 3x10 with yoga blocks at start and end of session and throughout session between exercises. As well as interspersed with manual therapy related to progression of force to improve condition.   Lock 5 (completed in this order, cuing for strong glute and abdominal contraction, and  improved form). ? Lockclams x 25 reps each side.  ? Front plank (toes to elbows) 2x30 seconds.  ? Side plank (feet stacked) 2x15 seconds each side ? Front plank (toes to hands) with shoulder taps, 2x10 each side (patient reports pain free at this point, with walking trial)   Squat with BUE lifting loaded weights  x10 with 29.5 lb box  x10 with 39.5 lb box  x10 with 29.5 lb box  Heels elevated on 2x4 inch board to eliminate effect that restricted dorsiflexion may have on squat.   x10 with yellow medicine ball squat and jump   x10 without weight squat to the chair.  Clean/deadlift practice with PVC pipe to help patient feel and see hip dominant movement with back braced for improved glute contraction/motor control that can be translated to squat.  Patient was educated on diagnosis, anatomy and pathology involved, and prognosis. Patient also provided extensive tactile cuing to address her forms and pelvic alignment which contributed to her back pain during exercises and ADLs.   HOME EXERCISE PROGRAM Access Code: [email or web address] URL: [email or web address] Date: 07/29/2019  Prepared by: Rosita Kea   Program Notes  For all strenghtening exercises (new ones) Make sure you squeeze abdominal muscles and glutes during entire exercise. This is more important than actually completing the exercise.   Think of pinching a penny between the glutes to help activate properly.   Exercises  [place] Lumbar Extension Press Up - 10-15 reps - 1 second hold - 4-8x daily  Seated Correct Posture  Loch Clams - 1 sets - 25 reps - 1x daily - 7x weekly  Standard Plank - 2 reps - 30 seconds hold - 1x daily - 7x weekly  Side Plank on Elbow - 2 reps - 15 seconds hold - 1x daily - 7x weekly  Full Plank with Shoulder Taps - 2 sets - 10 reps - 1x daily - 7x weekly  Prone Hip Extension with Bent Knee - 3 sets - 10 reps - 1x daily - 7x weekly    Patient tolerated the  session well with minor increase of back soreness which is normally after exercises. Patient was able to follow instructions for functional exercises (squat), however she had significant difficulties to perform with proper techniques and muscle activations/coordination sequencing. Improved with video feedback and extensive cuing. She demo excessive anterior pelvic tilt and early hip rise when ascending out of squat/lift that was improved with better glute contraction to initiate ascent as well as improved glute tension during descent and at bottom of squat. Extensive time and cuing provided to improve her forms, gluteal activation as well as proper pelvic align which is crucial to her pain control. Patient did mention the squatting practice provoked feeling of tightness over upper R glute region that she previously came to PT for at the beginning of this year that previously improved, and she wonders if it is connected to her current problem. Plan to further assess this next session. Plan to continue working on core, LE, and  glute stability with gradual return to functional activities as tolerated at future visits. Patient would benefit from continued physical therapy to address remaining impairments and functional limitations to work towards stated goals and return to PLOF or maximal functional independence     PT Education - 08/05/19 0907    Education Details  Exercise purpose/form. Self management techniques.    Person(s) Educated  Patient    Methods  Explanation;Demonstration;Tactile cues;Verbal cues    Comprehension  Verbalized understanding;Returned demonstration;Verbal cues required;Tactile cues required       PT Short Term Goals - 07/26/19 1757      PT SHORT TERM GOAL #1   Title  Be independent with initial home exercise program for self-management of symptoms.    Baseline  initial HEP provided at IE (07/21/2019);    Time  2    Period  Weeks    Status  Achieved    Target Date  08/05/19         PT Long Term Goals - 07/22/19 2006      PT LONG TERM GOAL #1   Title  Demonstrate improved FOTO score to equal or greater than 87 to demonstrate improvement in overall condition and self-reported functional ability.    Baseline  FOTO = 63 (07/21/2019);    Time  6    Period  Weeks    Status  New    Target Date  09/02/19      PT LONG TERM GOAL #2   Title  Have full lumbar AROM with no compensations or increase in pain in all planes except intermittent end range discomfort to allow patient to complete valued activities with less difficulty.    Baseline  painful, see objective exam (07/21/2019);    Time  6    Period  Weeks    Status  New    Target Date  09/02/19      PT LONG TERM GOAL #3   Title  Bilateral hip extension strength will be equal or greater than 4+/5 and pain free to improve ability to return to ADLs and fitness activities without difficulty.    Baseline  R hip extension initially at 3+/5, worst with knee extended (07/21/2019);    Time  6    Period  Weeks    Status  New    Target Date  09/02/19      PT LONG TERM GOAL #4   Title  Complete community, work and/or recreational activities without limitation due to current condition.    Baseline  currently unable to run or work out as usual, limiting lifting, etc (07/21/2019);    Time  6    Period  Weeks    Status  New    Target Date  09/02/19      PT LONG TERM GOAL #5   Title  Be independent with a long-term home exercise program for self-management of symptoms.    Baseline  initial HEP provided at IE (07/21/2019);    Time  6    Period  Weeks    Status  New    Target Date  09/02/19            Plan - 08/05/19 1008    Clinical Impression Statement  Patient tolerated the session well with minor increase of back soreness which is normally after exercises. Patient was able to follow instructions for functional exercises (squat), however she had significant difficulties to perform with proper techniques and  muscle activations/coordination sequencing. Improved with video  feedbavck and extensive cuing. She demo excessive anterior pelvic tilt and early hip rise when ascending out of squat/lift that was improved with better glute contraction to initiate ascent as well as improved glute tension during descent and at bottom of squat. Extensive time and cuing provided to improve her forms, gluteal activation as well as proper pelvic align which is crucial to her pain control. Plan to continue working on core, LE, and glute stability with gradual return to functional activities as tolerated at future visits. Patient would benefit from continued physical therapy to address remaining impairments and functional limitations to work towards stated goals and return to PLOF or maximal functional independence    Personal Factors and Comorbidities  Age;Education;Behavior Pattern;Social Background;Fitness;Past/Current Experience;Comorbidity 1    Comorbidities  history of R hip pain    Examination-Activity Limitations  Bend;Caring for Others;Carry;Lift;Transfers;Squat;Sit;Dressing;Stand;Bed Mobility   running, lifting weights   Examination-Participation Restrictions  Other;Community Activity;Interpersonal Relationship;Cleaning   work, farm Insurance account managermanagement, fitness activities   Stability/Clinical Decision Making  Stable/Uncomplicated    Rehab Potential  Good    PT Frequency  2x / week    PT Duration  6 weeks    PT Treatment/Interventions  ADLs/Self Care Home Management;Cryotherapy;Electrical Stimulation;Moist Heat;Therapeutic activities;Therapeutic exercise;Balance training;Neuromuscular re-education;Patient/family education;Manual techniques;Manual lymph drainage;Dry needling;Passive range of motion;Spinal Manipulations;Joint Manipulations    PT Next Visit Plan  continue core, glute, and functional progression as tolerated. specific exercise for pain control    PT Home Exercise Plan  Access Code: [email or web address]    Consulted and Agree  with Plan of Care  Patient       Patient will benefit from skilled therapeutic intervention in order to improve the following deficits and impairments:  Decreased strength, Impaired flexibility, Increased muscle spasms, Pain, Decreased activity tolerance, Decreased endurance, Decreased mobility, Difficulty walking, Impaired perceived functional ability, Increased fascial restricitons, Decreased range of motion  Visit Diagnosis: Acute left-sided low back pain without sciatica  Muscle weakness (generalized)  Pain in right hip     Problem List Patient Active Problem List   Diagnosis Date Noted  . Acute left-sided low back pain 07/19/2019  . Rash/skin eruption 07/16/2019  . Preventative health care 04/16/2018    .Nelly Routan Avigdor Dollar, SPT 08/05/19, 10:42 AM  Luretha MurphySara R. Ilsa IhaSnyder, PT, DPT 08/05/19, 10:42 AM   [facility] [address] [place], [place], [address] Phone: [phone]   Fax:  [phone]  Name: Lori Mcgee MRN: [id number] Date of Birth: 1986-07-30

## 2019-08-09 ENCOUNTER — Encounter: Payer: Self-pay | Admitting: Physical Therapy

## 2019-08-09 ENCOUNTER — Ambulatory Visit: Payer: No Typology Code available for payment source | Admitting: Physical Therapy

## 2019-08-09 ENCOUNTER — Other Ambulatory Visit: Payer: Self-pay

## 2019-08-09 DIAGNOSIS — M545 Low back pain, unspecified: Secondary | ICD-10-CM

## 2019-08-09 DIAGNOSIS — M6281 Muscle weakness (generalized): Secondary | ICD-10-CM

## 2019-08-09 NOTE — Therapy (Signed)
[facility] [address], [place], [address] Phone: [phone]   Fax:  [phone]  Physical Therapy Treatment  Patient Details  Name: Lori Mcgee MRN: [id number] Date of Birth: 1986/09/13 Referring Provider (PT): Glori Bickers, Wynelle Fanny, MD   Encounter Date: 08/09/2019  PT End of Session - 08/09/19 1948    Visit Number  6    Number of Visits  12    Date for PT Re-Evaluation  10/14/19    Authorization Type  [employer] reporting period from 07/21/2019    Authorization Time Period  Current cert period: 2/42/6834 - 10/14/2019    Authorization - Visit Number  6    Authorization - Number of Visits  10    PT Start Time  1435    PT Stop Time  1530    PT Time Calculation (min)  55 min    Equipment Utilized During Treatment  Gait belt    Activity Tolerance  Patient tolerated treatment well    Behavior During Therapy  [facility] for tasks assessed/performed       Past Medical History:  Diagnosis Date  . BV (bacterial vaginosis)     Past Surgical History:  Procedure Laterality Date  . HYSTEROSCOPY N/A 10/31/2016   Procedure: HYSTEROSCOPY WITH IUD REMOVAL;  Surgeon: Rubie Maid, MD;  Location: [facility];  Service: Gynecology;  Laterality: N/A;  . iud extraction  10/31/2016  . WISDOM TOOTH EXTRACTION      There were no vitals filed for this visit.  Subjective Assessment - 08/09/19 1433    Subjective  Patient went camping and hiking over the weekend and didn't complain of back pain throughout the trip. Patient states she felt her right side glut medius pain came back again which she treated previous with drying needling and it worked perfect.    Pertinent History  Patient is a 33 y.o. female who presents to outpatient physical therapy with a referral for medical diagnosis acute left-sided low back pain without sciatica. This patient's chief complaints consist of pain in the L glute and lumbar spine causing  difficulty with motions involving lumbopelvic region leading to the following functional deficits: difficulty sitting, bending, lifting, twisting, transferring, prolonged standing, bed mobility. Patient reports sudden onset while stepping backwards 07/18/2019 and no prior history of this type of problem. Relevant past medical history and comorbidities include patient was recently treated for poison ivy that caused a rash over the left flank taking a 15 day taper of prednisone that ended yesterday. History of R hip pain earlier this year that has resolved.    Limitations  Sitting;Other (comment);House hold activities;Lifting   sitting, twisting, lumbar extension, anterior pelvic tilt, rolling in bed, getting in and out car, standing up and sitting down.   How long can you sit comfortably?  none    Diagnostic tests  none    Patient Stated Goals  resolve pain and return to prior level of activity    Currently in Pain?  Yes    Pain Location  Hip    Pain Orientation  Right    Pain Onset  1 to 4 weeks ago       TREATMENT: Therapeutic exercise:to centralize symptoms and improve ROM, strength, muscular endurance, and activity tolerance required for successful completion of functional activities.  Treadmillwalkingat3.74mphup to10%grade.For improved lower extremity mobility, muscular endurance, and weightbearing activity tolerance; and to induce the analgesic effect of aerobic exercise, stimulate improved joint nutrition, and prepare  body structures and systems for following interventions.X 6 minutes.  Prone press up with hips off center to1x10 with. Patient is not able to proceed further due to pain at L shoulder.   Overpressure stretching cervical R SB performed x 30s to relief pain at L shoulder.  Lock5 (completed in this order, cuing for strong glute and abdominal contraction, and improvedform). ? Lockclams x 25 reps each side.  ? Front plank (toes to elbows) 2x30 seconds.  ? Side  plank (feet stacked) 2x15 seconds each side ? Front plank (toes to hands) with shoulder taps, 2x10 each side  Therapeutic activities: for functional strengthening and improved functional activity tolerance. Squat without BUE lifting weights ? x10 with squat  ? x20 with PVC pipe at posterior shoulder/front chest ? x20 with holding 5s at lowest position to encourage gluteal activation ? x20 with squat jump ? x20 with squat to lower level compare to previous squat to assess ability for proper gluteal activation at modified environment.  Education/cuing and close monitoring provided during each individual squat to improve proper gluteal activation as well optimal pelvic tilting and TA activation.   Modalities Dry needling performed to glute med on the R LE  to decrease pain and spasms along patient's hip and glute region with patient in prone utilizing (1) dry needle(s) .25mm x 60mm. Patient educated about the risks and benefits from therapy and verbally consents to treatment.  Dry needling performed by Ashok Cordia, PT, DPT who is certified in this technique. Patient reported feeling of "releiving pain" in the region needled following the procedure and did not have any adverse reaction.     HOME EXERCISE PROGRAM Access Code: [email or web address] URL: [email or web address] Date: 07/29/2019  Prepared by: Norton Blizzard   Program Notes  For all strenghtening exercises (new ones) Make sure you squeeze abdominal muscles and glutes during entire exercise. This is more important than actually completing the exercise.   Think of pinching a penny between the glutes to help activate properly.   Exercises  Prone Off Center Lumbar Extension Press Up - 10-15 reps - 1 second hold - 4-8x daily  Seated Correct Posture  Loch Clams - 1 sets - 25 reps - 1x daily - 7x weekly  Standard Plank - 2 reps - 30 seconds hold - 1x daily - 7x weekly  Side Plank on Elbow - 2 reps - 15 seconds hold -  1x daily - 7x weekly  Full Plank with Shoulder Taps - 2 sets - 10 reps - 1x daily - 7x weekly  Prone Hip Extension with Bent Knee - 3 sets - 10 reps - 1x daily - 7x weekly    Patient tolerated the session well without increase of her back pain. Patient demonstrated improved gluteal activation pattern with functional exercises but had difficulties maintain the proper alignment when added challenges (speed/UEs involvement/jump). Patient has been further assess and treated for her complain of R gluteal pain and responded well with dry needling at the end of the session for pain control. Patient has being made great progression toward back pain control through the previous session and will continue to working on core, LE, and glute stability with gradual return to functional activities as tolerated at future visits. Patient would benefit from continued physical therapy to address remaining impairments and functional limitations to work towards stated goals and return to PLOF or maximal functional independence     PT Education - 08/09/19 1947    Education Details  Exercise purpose/form. Self management  techniques.    Person(s) Educated  Patient    Methods  Explanation;Demonstration;Tactile cues;Verbal cues    Comprehension  Verbalized understanding;Returned demonstration;Verbal cues required;Tactile cues required       PT Short Term Goals - 07/26/19 1757      PT SHORT TERM GOAL #1   Title  Be independent with initial home exercise program for self-management of symptoms.    Baseline  initial HEP provided at IE (07/21/2019);    Time  2    Period  Weeks    Status  Achieved    Target Date  08/05/19        PT Long Term Goals - 07/22/19 2006      PT LONG TERM GOAL #1   Title  Demonstrate improved FOTO score to equal or greater than 87 to demonstrate improvement in overall condition and self-reported functional ability.    Baseline  FOTO = 63 (07/21/2019);    Time  6    Period  Weeks     Status  New    Target Date  09/02/19      PT LONG TERM GOAL #2   Title  Have full lumbar AROM with no compensations or increase in pain in all planes except intermittent end range discomfort to allow patient to complete valued activities with less difficulty.    Baseline  painful, see objective exam (07/21/2019);    Time  6    Period  Weeks    Status  New    Target Date  09/02/19      PT LONG TERM GOAL #3   Title  Bilateral hip extension strength will be equal or greater than 4+/5 and pain free to improve ability to return to ADLs and fitness activities without difficulty.    Baseline  R hip extension initially at 3+/5, worst with knee extended (07/21/2019);    Time  6    Period  Weeks    Status  New    Target Date  09/02/19      PT LONG TERM GOAL #4   Title  Complete community, work and/or recreational activities without limitation due to current condition.    Baseline  currently unable to run or work out as usual, limiting lifting, etc (07/21/2019);    Time  6    Period  Weeks    Status  New    Target Date  09/02/19      PT LONG TERM GOAL #5   Title  Be independent with a long-term home exercise program for self-management of symptoms.    Baseline  initial HEP provided at IE (07/21/2019);    Time  6    Period  Weeks    Status  New    Target Date  09/02/19            Plan - 08/09/19 2000    Clinical Impression Statement  Patient tolerated the session well without increase of her back pain. Patient demonstrated improved gluteal activation pattern with functional exercises but had difficulties maintain the proper alignment when added challenges (speed/UEs involvement/jump). Patient has been further assess and treated for her complain of R gluteal pain and responded well with dry needling at the end of the session for pain control. Patient has being made great progression toward back pain control through the previous session and will continue to working on core, LE, and glute  stability with gradual return to functional activities as tolerated at future visits. Patient would benefit from continued  physical therapy to address remaining impairments and functional limitations to work towards stated goals and return to PLOF or maximal functional independence    Personal Factors and Comorbidities  Age;Education;Behavior Pattern;Social Background;Fitness;Past/Current Experience;Comorbidity 1    Comorbidities  history of R hip pain    Examination-Activity Limitations  Bend;Caring for Others;Carry;Lift;Transfers;Squat;Sit;Dressing;Stand;Bed Mobility   running, lifting weights   Examination-Participation Restrictions  Other;Community Activity;Interpersonal Relationship;Cleaning   work, farm Insurance account managermanagement, fitness activities   Stability/Clinical Decision Making  Stable/Uncomplicated    Rehab Potential  Good    PT Frequency  2x / week    PT Duration  6 weeks    PT Treatment/Interventions  ADLs/Self Care Home Management;Cryotherapy;Electrical Stimulation;Moist Heat;Therapeutic activities;Therapeutic exercise;Balance training;Neuromuscular re-education;Patient/family education;Manual techniques;Manual lymph drainage;Dry needling;Passive range of motion;Spinal Manipulations;Joint Manipulations    PT Next Visit Plan  continue core, glute, and functional progression as tolerated. specific exercise for pain control    PT Home Exercise Plan  Access Code: [email or web address]    Consulted and Agree with Plan of Care  Patient       Patient will benefit from skilled therapeutic intervention in order to improve the following deficits and impairments:  Decreased strength, Impaired flexibility, Increased muscle spasms, Pain, Decreased activity tolerance, Decreased endurance, Decreased mobility, Difficulty walking, Impaired perceived functional ability, Increased fascial restricitons, Decreased range of motion  Visit Diagnosis: Acute left-sided low back pain without sciatica  Muscle weakness  (generalized)     Problem List Patient Active Problem List   Diagnosis Date Noted  . Acute left-sided low back pain 07/19/2019  . Rash/skin eruption 07/16/2019  . Preventative health care 04/16/2018    .Nelly Routan Leandre Wien, SPT 08/09/19, 8:08 PM  Luretha MurphySara R. Ilsa IhaSnyder, PT, DPT 08/09/19, 8:10 PM   [facility] [address] [place], [place], [id number] Phone: [phone]   Fax:  [phone]  Name: Hollie BeachRachel L Askari MRN: [id number] Date of Birth: Mar 28, 1986

## 2019-08-11 ENCOUNTER — Ambulatory Visit: Payer: No Typology Code available for payment source | Admitting: Physical Therapy

## 2019-08-12 ENCOUNTER — Ambulatory Visit: Payer: No Typology Code available for payment source | Admitting: Physical Therapy

## 2019-08-12 ENCOUNTER — Other Ambulatory Visit: Payer: Self-pay

## 2019-08-12 ENCOUNTER — Encounter: Payer: Self-pay | Admitting: Physical Therapy

## 2019-08-12 DIAGNOSIS — M25551 Pain in right hip: Secondary | ICD-10-CM

## 2019-08-12 DIAGNOSIS — M545 Low back pain, unspecified: Secondary | ICD-10-CM

## 2019-08-12 DIAGNOSIS — M6281 Muscle weakness (generalized): Secondary | ICD-10-CM

## 2019-08-12 NOTE — Therapy (Signed)
[facility] [address], [place], [address] Phone: [phone]   Fax:  [phone]  Physical Therapy Treatment  Patient Details  Name: Lori Mcgee MRN: [id number] Date of Birth: [birth year] Referring Provider (PT): Milinda Antis, Audrie Gallus, MD   Encounter Date: 08/12/2019  PT End of Session - 08/12/19 1930    Visit Number  7    Number of Visits  12    Date for PT Re-Evaluation  10/14/19    Authorization Type  [employer] reporting period from 07/21/2019    Authorization Time Period  Current cert period: 07/21/2019 - 10/14/2019    Authorization - Visit Number  7    Authorization - Number of Visits  10    PT Start Time  1420    PT Stop Time  1500    PT Time Calculation (min)  40 min    Equipment Utilized During Treatment  Gait belt    Activity Tolerance  Patient tolerated treatment well    Behavior During Therapy  [facility] for tasks assessed/performed       Past Medical History:  Diagnosis Date  . BV (bacterial vaginosis)     Past Surgical History:  Procedure Laterality Date  . HYSTEROSCOPY N/A 10/31/2016   Procedure: HYSTEROSCOPY WITH IUD REMOVAL;  Surgeon: Hildred Laser, MD;  Location: [facility] ORS;  Service: Gynecology;  Laterality: N/A;  . iud extraction  10/31/2016  . WISDOM TOOTH EXTRACTION      There were no vitals filed for this visit.  Subjective Assessment - 08/12/19 1923    Subjective  Pateint reports that she felt really good following last treatment session with releif of R glute pain and no low back pain for about 1  day. The next day she felt so good she did quite a bit of physical activity and her R glute pain returned. Since then she feels it with all activities such as walking, squatting, etc. She felt strongly that she needed some cardio and was unable to run, so she tried her stationary bike with fairly good success. She states that as predicted by the physical therapist she was very stiff  and had some back pain when she got off the bike she successully releived her back pain and stiffness with prone press up exercise. She is currently frustrated by her glute pain. She feels like she may have developed back pain after compensating for her glute pain. She states following her last episode of PT care that was disrupted by COVID19 clinic shutdown after 2 visits, she did not return to her former running freqency (only about 2 time per week now intead of 4 times per week) due to fear of re-provoking that pain.    Pertinent History  Patient is a 33 y.o. female who presents to outpatient physical therapy with a referral for medical diagnosis acute left-sided low back pain without sciatica. This patient's chief complaints consist of pain in the L glute and lumbar spine causing difficulty with motions involving lumbopelvic region leading to the following functional deficits: difficulty sitting, bending, lifting, twisting, transferring, prolonged standing, bed mobility. Patient reports sudden onset while stepping backwards 07/18/2019 and no prior history of this type of problem. Relevant past medical history and comorbidities include patient was recently treated for poison ivy that caused a rash over the left flank taking a 15 day taper of prednisone that ended yesterday. History of R hip pain earlier this year that has resolved.  Limitations  Sitting;Other (comment);House hold activities;Lifting   sitting, twisting, lumbar extension, anterior pelvic tilt, rolling in bed, getting in and out car, standing up and sitting down.   How long can you sit comfortably?  none    Diagnostic tests  none    Patient Stated Goals  resolve pain and return to prior level of activity    Currently in Pain?  Yes    Pain Score  2     Pain Location  Buttocks    Pain Orientation  Right    Pain Descriptors / Indicators  Sore    Pain Onset  1 to 4 weeks ago        TREATMENT: Therapeutic exercise:to centralize  symptoms and improve ROM, strength, muscular endurance, and activity tolerance required for successful completion of functional activities.  Body weight squats x 10 to assess form and response to help plan sesoin  Treadmillwalkingat3.2mphup to10%grade.For improved lower extremity mobility, muscular endurance, and weightbearing activity tolerance; and to induce the analgesic effect of aerobic exercise, stimulate improved joint nutrition, and prepare body structures and systems for following interventions.[id number].  Body weight squats x 5-10 to assess response and further assess form. Demo improved lumbopelvic control  sidelying (R hip down), R hip ER against gravity with eccentric contraction of ERs to full IR stretch. 3x10 (with no weight, 5# weight, 2# weight, no weight). Felt buring as if muscle was fatigued up to 7th rep, then pain to 10th rep. Pain returns to baseline each rest. Added to HEP with no weight.   Ambulation 50 feet through clinic and 5 body weight squats to assess response to treatment following manual and eccentric ER exercise (above). Patient reports feeling a bit better and definitely not worse.   Seated figure 4 stretch targeting R hip external rotators leaning back on hands bracing trunk to increase hip flexion and deepen stretch with contralateral leg in hooklying on table. Patient report she gets a satisfying stretch that feels good in this position. 5x10 seconds.   Education and trial of self STM with LAX ball at R glute in same position as seated figure 4 stretch. Patient reports feels very relieving.  Added to HEP.   Manual therapy: to reduce pain and tissue tension, improve range of motion, neuromodulation, in order to promote improved ability to complete functional activities.   Patient in prone position:   - STM to R glute region focusing on area with concordant sign to palpation over deep external rotators. Trigger point release and deep STM to decrease  pain and tightness. Patient reported it was painful but felt reliving. Quite tender.  - Resisted R hip IR and ER through range as well as PROM overpressure to both directions to further assess source of pain and better plan today's treatment. Patient most tender to active contraction of hip external rotators while at end range external rotation and during passive lengthening of hip external rotators with overpressure to end range internal rotation.    HOME EXERCISE PROGRAM Access Code: [email or web address] URL: [email or web address] Date: 07/29/2019  Prepared by: Norton BlizzardSara Snyder   Program Notes  For all strenghtening exercises (new ones) Make sure you squeeze abdominal muscles and glutes during entire exercise. This is more important than actually completing the exercise.   Think of pinching a penny between the glutes to help activate properly.   Exercises  Prone Off Center Lumbar Extension Press Up - 10-15 reps - 1 second hold - 4-8x daily  Seated Correct Posture  [place] -  1 sets - 25 reps - 1x daily - 7x weekly  Standard Plank - 2 reps - 30 seconds hold - 1x daily - 7x weekly  Side Plank on Elbow - 2 reps - 15 seconds hold - 1x daily - 7x weekly  Full Plank with Shoulder Taps - 2 sets - 10 reps - 1x daily - 7x weekly  Prone Hip Extension with Bent Knee - 3 sets - 10 reps - 1x daily - 7x weekly   Side Lying Hip ER: Repeat 10 Times Complete  3 Sets Perform 1 Times a Day  LAX massage at R glute   Patient tolerated the session well without increase of her back pain. Patient continues to demonstrate improved gluteal activation pattern with squats but continued to be limited by R glute pain. Further assessed pain with palpation, resisted motion, and overpressure. Pain appears to be related to irritation in R deep hip external rotators this session. Patient responded well to manual therapy and exercises to address this region. Updated HEP with self STM techniques,  stretching, and exercise for this region. Patient reported some relief to this region by end of session. Patient continues to demonstrate excellent progress towards functional recovery in regards to original low back pain but is now limited by R glute pain that arose when addressing form and activity limitations related to low back pain and is likely related to altered movement patterns that contribute to her low back pain. Plan to continue addressing this with pain control, manual, and tissue loading techniques to improve function. Patient would benefit from continued physical therapy to address remaining impairments and functional limitations to work towards stated goals and return to PLOF or maximal functional independence    PT Education - 08/12/19 1929    Education Details  Exercise purpose/form. Self management techniques. Updated HEP    Person(s) Educated  Patient    Methods  Explanation;Demonstration;Tactile cues;Verbal cues;Handout    Comprehension  Verbalized understanding;Returned demonstration       PT Short Term Goals - 07/26/19 1757      PT SHORT TERM GOAL #1   Title  Be independent with initial home exercise program for self-management of symptoms.    Baseline  initial HEP provided at IE (07/21/2019);    Time  2    Period  Weeks    Status  Achieved    Target Date  08/05/19        PT Long Term Goals - 07/22/19 2006      PT LONG TERM GOAL #1   Title  Demonstrate improved FOTO score to equal or greater than 87 to demonstrate improvement in overall condition and self-reported functional ability.    Baseline  FOTO = 63 (07/21/2019);    Time  6    Period  Weeks    Status  New    Target Date  09/02/19      PT LONG TERM GOAL #2   Title  Have full lumbar AROM with no compensations or increase in pain in all planes except intermittent end range discomfort to allow patient to complete valued activities with less difficulty.    Baseline  painful, see objective exam (07/21/2019);     Time  6    Period  Weeks    Status  New    Target Date  09/02/19      PT LONG TERM GOAL #3   Title  Bilateral hip extension strength will be equal or greater than 4+/5 and pain free  to improve ability to return to ADLs and fitness activities without difficulty.    Baseline  R hip extension initially at 3+/5, worst with knee extended (07/21/2019);    Time  6    Period  Weeks    Status  New    Target Date  09/02/19      PT LONG TERM GOAL #4   Title  Complete community, work and/or recreational activities without limitation due to current condition.    Baseline  currently unable to run or work out as usual, limiting lifting, etc (07/21/2019);    Time  6    Period  Weeks    Status  New    Target Date  09/02/19      PT LONG TERM GOAL #5   Title  Be independent with a long-term home exercise program for self-management of symptoms.    Baseline  initial HEP provided at IE (07/21/2019);    Time  6    Period  Weeks    Status  New    Target Date  09/02/19            Plan - 08/12/19 1955    Clinical Impression Statement  Patient tolerated the session well without increase of her back pain. Patient continues to demonstrate improved gluteal activation pattern with squats but continued to be limited by R glute pain. Further assessed pain with palpation, resisted motion, and overpressure. Pain appears to be related to irritation in R deep hip external rotators this session. Patient responded well to manual therapy and exercises to address this region. Updated HEP with self STM techniques, stretching, and exercise for this region. Patient reported some relief to this region by end of session. Patient continues to demonstrate excellent progress towards functional recovery in regards to original low back pain but is now limited by R glute pain that arose when addressing form and activity limitations related to low back pain and is likely related to altered movement patterns that contribute to her  low back pain. Plan to continue addressing this with pain control, manual, and tissue loading techniques to improve function. Patient would benefit from continued physical therapy to address remaining impairments and functional limitations to work towards stated goals and return to PLOF or maximal functional independence    Personal Factors and Comorbidities  Age;Education;Behavior Pattern;Social Background;Fitness;Past/Current Experience;Comorbidity 1    Comorbidities  history of R hip pain    Examination-Activity Limitations  Bend;Caring for Others;Carry;Lift;Transfers;Squat;Sit;Dressing;Stand;Bed Mobility   running, lifting weights   Examination-Participation Restrictions  Other;Community Activity;Interpersonal Relationship;Cleaning   work, farm Insurance account manager, fitness activities   Stability/Clinical Decision Making  Stable/Uncomplicated    Rehab Potential  Good    PT Frequency  2x / week    PT Duration  6 weeks    PT Treatment/Interventions  ADLs/Self Care Home Management;Cryotherapy;Electrical Stimulation;Moist Heat;Therapeutic activities;Therapeutic exercise;Balance training;Neuromuscular re-education;Patient/family education;Manual techniques;Manual lymph drainage;Dry needling;Passive range of motion;Spinal Manipulations;Joint Manipulations    PT Next Visit Plan  continue core, glute, and functional progression as tolerated. specific exercise for pain control    PT Home Exercise Plan  Access Code: [email or web address]    Consulted and Agree with Plan of Care  Patient       Patient will benefit from skilled therapeutic intervention in order to improve the following deficits and impairments:  Decreased strength, Impaired flexibility, Increased muscle spasms, Pain, Decreased activity tolerance, Decreased endurance, Decreased mobility, Difficulty walking, Impaired perceived functional ability, Increased fascial restricitons, Decreased range of motion  Visit Diagnosis: Acute  left-sided low back pain without  sciatica  Muscle weakness (generalized)  Pain in right hip    Problem List Patient Active Problem List   Diagnosis Date Noted  . Acute left-sided low back pain 07/19/2019  . Rash/skin eruption 07/16/2019  . Preventative health care 04/16/2018   Nelly Rout, SPT  Luretha Murphy. Ilsa Iha, PT, DPT 08/12/19, 8:00 PM  [facility] SPORTS MEDICINE [address] [place], [address] Phone: [phone]   Fax:  [phone]  Name: Lori Mcgee MRN: [id number] Date of Birth: [birth year]

## 2019-08-16 ENCOUNTER — Other Ambulatory Visit: Payer: Self-pay

## 2019-08-16 ENCOUNTER — Encounter: Payer: Self-pay | Admitting: Physical Therapy

## 2019-08-16 ENCOUNTER — Ambulatory Visit: Payer: No Typology Code available for payment source | Admitting: Physical Therapy

## 2019-08-16 DIAGNOSIS — M545 Low back pain, unspecified: Secondary | ICD-10-CM

## 2019-08-16 DIAGNOSIS — M6281 Muscle weakness (generalized): Secondary | ICD-10-CM

## 2019-08-16 DIAGNOSIS — M25551 Pain in right hip: Secondary | ICD-10-CM

## 2019-08-16 NOTE — Therapy (Addendum)
[facility] [address] [place], [address] Phone: [phone]   Fax:  [phone]  Physical Therapy Treatment  Patient Details  Name: Lori Mcgee MRN: [id number] Date of Birth: 1986-04-21 Referring Provider (PT): Glori Bickers, Wynelle Fanny, MD   Encounter Date: 08/16/2019  PT End of Session - 08/16/19 1854    Visit Number  8    Number of Visits  12    Date for PT Re-Evaluation  10/14/19    Authorization Type  [employer] reporting period from 07/21/2019    Authorization Time Period  Current cert period: 4/40/1027 - 10/14/2019    Authorization - Visit Number  8    Authorization - Number of Visits  10    PT Start Time  2536    PT Stop Time  1900    PT Time Calculation (min)  74 min    Equipment Utilized During Treatment  --    Activity Tolerance  Patient tolerated treatment well    Behavior During Therapy  [facility] for tasks assessed/performed       Past Medical History:  Diagnosis Date  . BV (bacterial vaginosis)     Past Surgical History:  Procedure Laterality Date  . HYSTEROSCOPY N/A 10/31/2016   Procedure: HYSTEROSCOPY WITH IUD REMOVAL;  Surgeon: Rubie Maid, MD;  Location: [facility] ORS;  Service: Gynecology;  Laterality: N/A;  . iud extraction  10/31/2016  . WISDOM TOOTH EXTRACTION      There were no vitals filed for this visit.   Subjective Assessment - 08/16/19 1808    Subjective  Patient reports pain as 4/10 at her R hip and HEP from last session increased her pain at R hip.    Pertinent History  Patient is a 33 y.o. female who presents to outpatient physical therapy with a referral for medical diagnosis acute left-sided low back pain without sciatica. This patient's chief complaints consist of pain in the L glute and lumbar spine causing difficulty with motions involving lumbopelvic region leading to the following functional deficits: difficulty sitting, bending, lifting, twisting,  transferring, prolonged standing, bed mobility. Patient reports sudden onset while stepping backwards 07/18/2019 and no prior history of this type of problem. Relevant past medical history and comorbidities include patient was recently treated for poison ivy that caused a rash over the left flank taking a 15 day taper of prednisone that ended yesterday. History of R hip pain earlier this year that has resolved.    Limitations  Sitting;Other (comment);House hold activities;Lifting   sitting, twisting, lumbar extension, anterior pelvic tilt, rolling in bed, getting in and out car, standing up and sitting down.   How long can you sit comfortably?  none    Diagnostic tests  none    Patient Stated Goals  resolve pain and return to prior level of activity    Currently in Pain?  Yes    Pain Score  4     Pain Location  Hip    Pain Orientation  Right    Pain Onset  1 to 4 weeks ago       OBJECTIVE  SIJ special tests: Distraction: Negative  Compression: Negative   Thigh Thrust: Negative  Sacral Thrust: Negative  Gaenslen's: Negative  Leg length assessment in long sitting and supine to check for unequal innominate rotation: negative, no change   TREATMENT: Therapeutic exercise:to centralize symptoms and improve ROM, strength, muscular endurance, and activity tolerance required for successful completion of functional  activities.  Treadmillwalkingat3. to10%grade.For improved lower extremity mobility, muscular endurance, and weightbearing activity tolerance; and to induce the analgesic effect of aerobic exercise, stimulate improved joint nutrition, and prepare body structures and systems for following interventions.X .  Prone press up  ? Neutral without yoga blocks x10 ? with hips off center toL and yoga blocks under hands 2x10 (second set with slight side glide over pressure).  Run 40 feet x 4 reps to assess response to activities.  Side-glide against wall  ? R hip  to wall with yoga block as spacer at elbow x 10, no change ? L hip to wall with yoga block as spacer at elbow 2x10, possible small improvmeent  Lateral step down x10 reps each side.   Hip hikes off step, x10 each side  Single leg squat/butt tap x10 each side  SL RDL with 10# x 10 each side  Education on HEP including handout   Attempted sidelying hip abduction x 5 on R but discontinued due to irritation at R glute.  Theraputic activities: for functional strengthening and improved functional activity tolerance. Squat with BUE lifting loaded weights  x10 with 9.5 lb box  X10 with 19.5 lb box  x5 Jump squat. Increase R hip pain after 5th jump squat    08/16/19 0001  Manual Therapy  Manual Therapy Soft tissue mobilization;Myofascial release;Passive ROM  Soft tissue mobilization Right posterior gluteus medius; Right piriformis  Myofascial Release Right posterior gluteus medius; Right piriformis  Passive ROM Prone Right Hip External Rotation c concurrent sustained release (performed after TPDN)     08/16/19 0001  Trigger Point Dry Needling (less than 5 minutes total)   Consent Given? Yes  Education Handout Provided Previously provided  Muscles Treated Back/Hip Gluteus medius;Piriformis  Dry Needling Comments few twtiches in medius, and referral is different from Sx (Majority of pain and twitching in piriformis)  Gluteus Medius Response Twitch response elicited;Palpable increased muscle length  Piriformis Response Twitch response elicited;Palpable increased muscle length   Dry needling performed by Alvera Novel, PT, DPT who is certified in the method. See above for detail.  1:53 PM, 08/17/19 [name], PT, DPT Physical Therapist - [place] [phone] (Office)      HOME EXERCISE PROGRAM Access Code: [email or web address]  URL: [email or web address]  Date: 08/16/2019  Prepared by: Norton Blizzard   Program Notes  For all strenghtening exercises (new ones)  Make sure you squeeze abdominal muscles and glutes during entire exercise. This is more important than actually completing the exercise.   Think of pinching a penny between the glutes to help activate properly.   Exercises Prone Off Center Lumbar Extension Press Up - 10-15 reps - 1 second hold - 4x daily Standard Plank - 2 reps - 30 seconds hold - 1x daily - 7x weekly Side Plank on Elbow - 2 reps - 15 seconds hold - 1x daily - 7x weekly Full Plank with Shoulder Taps - 2 sets - 10 reps - 1x daily - 7x weekly Single Leg Bridge - 3 sets - 10 reps - 1 second hold - 1 every other day Hip Hikes off step - 3 sets - 10 reps - 1 every other day Lateral Step Down - 3 sets - 10 reps - every other day hold Piriformis Mobilization with Small [employer] Squat - 3 sets - 10 reps - 1x daily - 7x weekly   PT Education - 08/16/19 1854    Education Details  Exercise purpose/form. Self management techniques. Updated HEP  Person(s) Educated  Patient    Methods  Explanation;Demonstration;Tactile cues;Verbal cues    Comprehension  Verbalized understanding;Returned demonstration;Verbal cues required;Tactile cues required       PT Short Term Goals - 07/26/19 1757      PT SHORT TERM GOAL #1   Title  Be independent with initial home exercise program for self-management of symptoms.    Baseline  initial HEP provided at IE (07/21/2019);    Time  2    Period  Weeks    Status  Achieved    Target Date  08/05/19        PT Long Term Goals - 07/22/19 2006      PT LONG TERM GOAL #1   Title  Demonstrate improved FOTO score to equal or greater than 87 to demonstrate improvement in overall condition and self-reported functional ability.    Baseline  FOTO = 63 (07/21/2019);    Time  6    Period  Weeks    Status  New    Target Date  09/02/19      PT LONG TERM GOAL #2   Title  Have full lumbar AROM with no compensations or increase in pain in all planes except intermittent end range discomfort to allow patient  to complete valued activities with less difficulty.    Baseline  painful, see objective exam (07/21/2019);    Time  6    Period  Weeks    Status  New    Target Date  09/02/19      PT LONG TERM GOAL #3   Title  Bilateral hip extension strength will be equal or greater than 4+/5 and pain free to improve ability to return to ADLs and fitness activities without difficulty.    Baseline  R hip extension initially at 3+/5, worst with knee extended (07/21/2019);    Time  6    Period  Weeks    Status  New    Target Date  09/02/19      PT LONG TERM GOAL #4   Title  Complete community, work and/or recreational activities without limitation due to current condition.    Baseline  currently unable to run or work out as usual, limiting lifting, etc (07/21/2019);    Time  6    Period  Weeks    Status  New    Target Date  09/02/19      PT LONG TERM GOAL #5   Title  Be independent with a long-term home exercise program for self-management of symptoms.    Baseline  initial HEP provided at IE (07/21/2019);    Time  6    Period  Weeks    Status  New    Target Date  09/02/19            Plan - 08/16/19 1912    Clinical Impression Statement  Patient tolerated the session well with no overall increase in pain by end of session. Patient appears to be negative for SIJ pathology based on negative special tests. Continues to have irritation at R hip lateral rotators and glute med. Noted that discomfort moved lower from glute med region closer to piriformis following dry needling. No strong response to repeated motions testing, and did not add further OP this session due to recent directional preference for back pain in the opposite direction as possible response for R glute pain was. Patient provided with updated HEP with tolerable exercises for lateral hip stability. Patient continues to be limited  in her usual activities, especially running, squatting, and lifting. Patient would benefit from continued  physical therapy to address remaining impairments and functional limitations to work towards stated goals and return to PLOF or maximal functional independence.    Personal Factors and Comorbidities  Age;Education;Behavior Pattern;Social Background;Fitness;Past/Current Experience;Comorbidity 1    Comorbidities  history of R hip pain    Examination-Activity Limitations  Bend;Caring for Others;Carry;Lift;Transfers;Squat;Sit;Dressing;Stand;Bed Mobility   running, lifting weights   Examination-Participation Restrictions  Other;Community Activity;Interpersonal Relationship;Cleaning   work, farm Insurance account manager, fitness activities   Stability/Clinical Decision Making  Stable/Uncomplicated    Rehab Potential  Good    PT Frequency  2x / week    PT Duration  6 weeks    PT Treatment/Interventions  ADLs/Self Care Home Management;Cryotherapy;Electrical Stimulation;Moist Heat;Therapeutic activities;Therapeutic exercise;Balance training;Neuromuscular re-education;Patient/family education;Manual techniques;Manual lymph drainage;Dry needling;Passive range of motion;Spinal Manipulations;Joint Manipulations    PT Next Visit Plan  continue core, glute, and functional progression as tolerated. specific exercise for pain control    PT Home Exercise Plan  Access Code: [email or web address]    Consulted and Agree with Plan of Care  Patient       Patient will benefit from skilled therapeutic intervention in order to improve the following deficits and impairments:  Decreased strength, Impaired flexibility, Increased muscle spasms, Pain, Decreased activity tolerance, Decreased endurance, Decreased mobility, Difficulty walking, Impaired perceived functional ability, Increased fascial restricitons, Decreased range of motion  Visit Diagnosis: Acute left-sided low back pain without sciatica  Muscle weakness (generalized)  Pain in right hip     Problem List Patient Active Problem List   Diagnosis Date Noted  . Acute left-sided low  back pain 07/19/2019  . Rash/skin eruption 07/16/2019  . Preventative health care 04/16/2018   Nelly Rout, SPT  Cira Rue 08/17/2019, 1:53 PM  1:53 PM, 08/17/19 [name], PT, DPT Physical Therapist - [place] [phone] (Office)    [facility] [address], [place], [address] Phone: [phone]   Fax:  [phone]  Name: SAMYUKTHA BRAU MRN: [id number] Date of Birth: [birth year]

## 2019-08-18 ENCOUNTER — Other Ambulatory Visit: Payer: Self-pay

## 2019-08-18 ENCOUNTER — Ambulatory Visit: Payer: No Typology Code available for payment source | Admitting: Physical Therapy

## 2019-08-18 ENCOUNTER — Encounter: Payer: Self-pay | Admitting: Physical Therapy

## 2019-08-18 DIAGNOSIS — M545 Low back pain, unspecified: Secondary | ICD-10-CM

## 2019-08-18 DIAGNOSIS — M25551 Pain in right hip: Secondary | ICD-10-CM

## 2019-08-18 DIAGNOSIS — M6281 Muscle weakness (generalized): Secondary | ICD-10-CM

## 2019-08-18 NOTE — Therapy (Signed)
[facility] [address], [place], [address] Phone: [phone]   Fax:  [phone]  Physical Therapy Treatment  Patient Details  Name: Lori Mcgee MRN: [id number] Date of Birth: 17-Nov-1985 Referring Provider (PT): Milinda Antis, Audrie Gallus, MD   Encounter Date: 08/18/2019  PT End of Session - 08/18/19 1951    Visit Number  9    Number of Visits  12    Date for PT Re-Evaluation  10/14/19    Authorization Type  [employer] Employee reporting period from 07/21/2019    Authorization Time Period  Current cert period: 07/21/2019 - 10/14/2019    Authorization - Visit Number  9    Authorization - Number of Visits  10    PT Start Time  1831    PT Stop Time  1920    PT Time Calculation (min)  49 min    Activity Tolerance  Patient tolerated treatment well    Behavior During Therapy  [facility] for tasks assessed/performed       Past Medical History:  Diagnosis Date  . BV (bacterial vaginosis)     Past Surgical History:  Procedure Laterality Date  . HYSTEROSCOPY N/A 10/31/2016   Procedure: HYSTEROSCOPY WITH IUD REMOVAL;  Surgeon: Hildred Laser, MD;  Location: [facility] ORS;  Service: Gynecology;  Laterality: N/A;  . iud extraction  10/31/2016  . WISDOM TOOTH EXTRACTION      There were no vitals filed for this visit.  Subjective Assessment - 08/18/19 1837    Subjective  Patient reports pain as 1/10 at her R hip and HEP from last session increased her pain at R hip. R shoulder pain caused by pulling on R shoulder from housework. States she did almost nothing yesterday except some of her HEP (step downs and hip hikes). States orthopedist previously suspected a R shoulder labrum tear when she had pain there near the start of the year.    Pertinent History  Patient is a 33 y.o. female who presents to outpatient physical therapy with a referral for medical diagnosis acute left-sided low back pain without sciatica. This patient's chief  complaints consist of pain in the L glute and lumbar spine causing difficulty with motions involving lumbopelvic region leading to the following functional deficits: difficulty sitting, bending, lifting, twisting, transferring, prolonged standing, bed mobility. Patient reports sudden onset while stepping backwards 07/18/2019 and no prior history of this type of problem. Relevant past medical history and comorbidities include patient was recently treated for poison ivy that caused a rash over the left flank taking a 15 day taper of prednisone that ended yesterday. History of R hip pain earlier this year that has resolved.    Limitations  Sitting;Other (comment);House hold activities;Lifting   sitting, twisting, lumbar extension, anterior pelvic tilt, rolling in bed, getting in and out car, standing up and sitting down.   How long can you sit comfortably?  none    Diagnostic tests  none    Patient Stated Goals  resolve pain and return to prior level of activity    Currently in Pain?  Yes    Pain Score  1     Pain Location  Hip    Pain Orientation  Right    Pain Onset  1 to 4 weeks ago       OBJECTIVE R shoulder screen:  TTP over anterior shoulder at long head of biceps IR and extension painful, AROM WFL overall  Special tests:  Empty can: negative for RTC (painful in full position, negative in empty position) O'Brien's: negative: (painful palm up, no pain palm down) Compression rotation: negative for pain, crepitus, clicking Anterior and posterior slide: negative for pain, crepitus, clicking Speed's: positive for biceps pain in 90 degrees flexion and in extension.  Yerguson's: positive for pain    TREATMENT: Therapeutic exercise:to centralize symptoms and improve ROM, strength, muscular endurance, and activity tolerance required for successful completion of functional activities.  Treadmillwalkingat3.58mphup to10%grade.For improved lower extremity mobility, muscular endurance,  and weightbearing activity tolerance; and to induce the analgesic effect of aerobic exercise, stimulate improved joint nutrition, and prepare body structures and systems for following interventions.X 7 minutes.  Screen for R shoulder pain (see above). Reassurance and education provided about healing, ice, and rest.   Run 40 feet x 10 reps to assess response to activities.   10 cones drill practice to assess forms x 10 reps.  ? Squat  ? Squat jump  ? Landing on bilateral feet and single foot ? Quick foot drill  ? Step jump squat sideways  ? 5lb dumbbell on one hand a time x 40 feetx2reps.  - Patient didn't complain pain with above exercises.   Theraputic activities: for functional strengthening and improved functional activity tolerance.  single leg squat to buttocks tap on edge of nustep seat. 3x10 each side. Cuing for improved knee alignment through hip activation. Noted for decreased knee control into valgus, L > R but pt can improved when she focuses.   Treadmill walk/run 42min/2min x 2 wit  Gait analysis while waking on treadmill: noted for lower R iliac crest and increased movement of R iliac crest when un-weighted.   Squats on BOSU ball (ball down) x10 with touchdown BUE support as needed.      HOME EXERCISE PROGRAM Access Code: [email or web address]       URL: [email or web address]    Date: 08/16/2019  Prepared by: Rosita Kea   Program Notes  For all strenghtening exercises (new ones) Make sure you squeeze abdominal muscles and glutes during entire exercise. This is more important than actually completing the exercise.   Think of pinching a penny between the glutes to help activate properly.   Exercises [place] Lumbar Extension Press Up - 10-15 reps - 1 second hold - 4x daily Standard Plank - 2 reps - 30 seconds hold - 1x daily - 7x weekly Side Plank on Elbow - 2 reps - 15 seconds hold - 1x daily - 7x weekly Full Plank with Shoulder Taps - 2 sets - 10  reps - 1x daily - 7x weekly Single Leg Bridge - 3 sets - 10 reps - 1 second hold - 1 every other day Hip Hikes off step - 3 sets - 10 reps - 1 every other day Lateral Step Down - 3 sets - 10 reps - every other day hold Piriformis Mobilization with Small [employer] Squat - 3 sets - 10 reps - 1x daily - 7x weekly   Patient tolerated treatment well and continues to make progress towards goals. Educated patient about load management and graded return to activity. Patient has mildly provoked "tightness not pain" in R glute with squats on BOSU ball but was able to run on TM for 2 min at a time without re-provoking symptoms. Noted for unequal motion of pelvis during ambulation and would benefit from further assessment of movement and asymetry between leg at next session. Patient is improving but continues to be unable to engage  in her usual activities without pain and difficulty. Patient would benefit from continued physical therapy to address remaining impairments and functional limitations to work towards stated goals and return to PLOF or maximal functional independence.     PT Education - 08/18/19 1840    Education Details  Exercise purpose/form. Self management techniques.    Person(s) Educated  Patient    Methods  Explanation;Demonstration;Tactile cues;Verbal cues    Comprehension  Verbalized understanding;Returned demonstration;Verbal cues required;Tactile cues required       PT Short Term Goals - 07/26/19 1757      PT SHORT TERM GOAL #1   Title  Be independent with initial home exercise program for self-management of symptoms.    Baseline  initial HEP provided at IE (07/21/2019);    Time  2    Period  Weeks    Status  Achieved    Target Date  08/05/19        PT Long Term Goals - 07/22/19 2006      PT LONG TERM GOAL #1   Title  Demonstrate improved FOTO score to equal or greater than 87 to demonstrate improvement in overall condition and self-reported functional ability.     Baseline  FOTO = 63 (07/21/2019);    Time  6    Period  Weeks    Status  New    Target Date  09/02/19      PT LONG TERM GOAL #2   Title  Have full lumbar AROM with no compensations or increase in pain in all planes except intermittent end range discomfort to allow patient to complete valued activities with less difficulty.    Baseline  painful, see objective exam (07/21/2019);    Time  6    Period  Weeks    Status  New    Target Date  09/02/19      PT LONG TERM GOAL #3   Title  Bilateral hip extension strength will be equal or greater than 4+/5 and pain free to improve ability to return to ADLs and fitness activities without difficulty.    Baseline  R hip extension initially at 3+/5, worst with knee extended (07/21/2019);    Time  6    Period  Weeks    Status  New    Target Date  09/02/19      PT LONG TERM GOAL #4   Title  Complete community, work and/or recreational activities without limitation due to current condition.    Baseline  currently unable to run or work out as usual, limiting lifting, etc (07/21/2019);    Time  6    Period  Weeks    Status  New    Target Date  09/02/19      PT LONG TERM GOAL #5   Title  Be independent with a long-term home exercise program for self-management of symptoms.    Baseline  initial HEP provided at IE (07/21/2019);    Time  6    Period  Weeks    Status  New    Target Date  09/02/19            Plan - 08/18/19 2009    Clinical Impression Statement  Patient tolerated treatment well and continues to make progress towards goals. Educated patient about load management and graded return to activity. Patient has mildly provoked "tightness not pain" in R glute with squats on BOSU ball but was able to run on TM for 2 min at a time  without re-provoking symptoms. Noted for unequal motion of pelvis during ambulation and would benefit from further assessment of movement and asymetry between leg at next session. Patient is improving but continues to  be unable to engage in her usual activities without pain and difficulty. Patient would benefit from continued physical therapy to address remaining impairments and functional limitations to work towards stated goals and return to PLOF or maximal functional independence.    Personal Factors and Comorbidities  Age;Education;Behavior Pattern;Social Background;Fitness;Past/Current Experience;Comorbidity 1    Comorbidities  history of R hip pain    Examination-Activity Limitations  Bend;Caring for Others;Carry;Lift;Transfers;Squat;Sit;Dressing;Stand;Bed Mobility   running, lifting weights   Examination-Participation Restrictions  Other;Community Activity;Interpersonal Relationship;Cleaning   work, farm Insurance account managermanagement, fitness activities   Stability/Clinical Decision Making  Stable/Uncomplicated    Rehab Potential  Good    PT Frequency  2x / week    PT Duration  6 weeks    PT Treatment/Interventions  ADLs/Self Care Home Management;Cryotherapy;Electrical Stimulation;Moist Heat;Therapeutic activities;Therapeutic exercise;Balance training;Neuromuscular re-education;Patient/family education;Manual techniques;Manual lymph drainage;Dry needling;Passive range of motion;Spinal Manipulations;Joint Manipulations    PT Next Visit Plan  continue core, glute, and functional progression as tolerated. specific exercise for pain control    PT Home Exercise Plan  Access Code: [email or web address]    Consulted and Agree with Plan of Care  Patient       Patient will benefit from skilled therapeutic intervention in order to improve the following deficits and impairments:  Decreased strength, Impaired flexibility, Increased muscle spasms, Pain, Decreased activity tolerance, Decreased endurance, Decreased mobility, Difficulty walking, Impaired perceived functional ability, Increased fascial restricitons, Decreased range of motion  Visit Diagnosis: Acute left-sided low back pain without sciatica  Muscle weakness (generalized)  Pain in  right hip     Problem List Patient Active Problem List   Diagnosis Date Noted  . Acute left-sided low back pain 07/19/2019  . Rash/skin eruption 07/16/2019  . Preventative health care 04/16/2018    Nelly RoutNan Jazzmin Newbold, SPT  Luretha MurphySara R. Ilsa IhaSnyder, PT, DPT 08/18/19, 8:14 PM  [facility] [address] [place], [place], [id number] Phone: [phone]   Fax:  [phone]  Name: Hollie BeachRachel L Vanbuskirk MRN: [id number] Date of Birth: [birth year]

## 2019-08-31 ENCOUNTER — Ambulatory Visit: Payer: No Typology Code available for payment source | Admitting: Physical Therapy

## 2019-08-31 ENCOUNTER — Encounter: Payer: Self-pay | Admitting: Physical Therapy

## 2019-08-31 ENCOUNTER — Other Ambulatory Visit: Payer: Self-pay

## 2019-08-31 DIAGNOSIS — M6281 Muscle weakness (generalized): Secondary | ICD-10-CM

## 2019-08-31 DIAGNOSIS — M545 Low back pain, unspecified: Secondary | ICD-10-CM

## 2019-08-31 DIAGNOSIS — M25551 Pain in right hip: Secondary | ICD-10-CM

## 2019-08-31 NOTE — Therapy (Signed)
[facility] [address] [place], [address] Phone: [phone]   Fax:  [phone]  Physical Therapy Treatment/Progress notes/Re-certification  Reporting period 07/21/2019 - 08/31/2019  Patient Details  Name: Lori Mcgee MRN: [id number] Date of Birth: 12/02/1985 Referring Provider (PT): Glori Bickers, Wynelle Fanny, MD   Encounter Date: 08/31/2019  PT End of Session - 08/31/19 1333    Visit Number  10    Number of Visits  22    Date for PT Re-Evaluation  10/12/19    Authorization Type  [employer] reporting period from 07/21/2019    Authorization Time Period  Current cert period: 3/81/8299 - 10/14/2019    Authorization - Visit Number  0    Authorization - Number of Visits  10    PT Start Time  0902    PT Stop Time  0946    PT Time Calculation (min)  44 min    Activity Tolerance  Patient tolerated treatment well    Behavior During Therapy  [facility] for tasks assessed/performed       Past Medical History:  Diagnosis Date  . BV (bacterial vaginosis)     Past Surgical History:  Procedure Laterality Date  . HYSTEROSCOPY N/A 10/31/2016   Procedure: HYSTEROSCOPY WITH IUD REMOVAL;  Surgeon: Rubie Maid, MD;  Location: [facility] ORS;  Service: Gynecology;  Laterality: N/A;  . iud extraction  10/31/2016  . WISDOM TOOTH EXTRACTION      There were no vitals filed for this visit.  Subjective Assessment - 08/31/19 0908    Subjective  Patient reports 0 pain at R hip today and only have pain at R lateral glutes when sitting too long (couple hours). Patient reports physical therapy has been helping her. Patient states her bilteral shoulders started hurting and thoese pain are her primary concerns now. She has not been running ro lifting heavy during her recent vacation but has been doing her HEP without difficulty or pain.    Pertinent History  Patient is a 33 y.o. female who presents to outpatient physical therapy with a  referral for medical diagnosis acute left-sided low back pain without sciatica. This patient's chief complaints consist of pain in the L glute and lumbar spine causing difficulty with motions involving lumbopelvic region leading to the following functional deficits: difficulty sitting, bending, lifting, twisting, transferring, prolonged standing, bed mobility. Patient reports sudden onset while stepping backwards 07/18/2019 and no prior history of this type of problem. Relevant past medical history and comorbidities include patient was recently treated for poison ivy that caused a rash over the left flank taking a 15 day taper of prednisone that ended yesterday. History of R hip pain earlier this year that has resolved.    Limitations  Sitting;Other (comment);House hold activities;Lifting   sitting, twisting, lumbar extension, anterior pelvic tilt, rolling in bed, getting in and out car, standing up and sitting down.   How long can you sit comfortably?  none    Diagnostic tests  none    Patient Stated Goals  resolve pain and return to prior level of activity    Currently in Pain?  No/denies    Pain Onset  1 to 4 weeks ago           [facility] PT Assessment - 08/31/19 0001      Assessment   Medical Diagnosis  acute left-sided low back pain without sciatica    Referring Provider (PT)  Tower, Wynelle Fanny, MD  Onset Date/Surgical Date  07/18/19    Hand Dominance  Right    Prior Therapy  none for this problem prior to this episode of care      Precautions   Precautions  None      Restrictions   Weight Bearing Restrictions  No      Prior Function   Level of Independence  Independent    Vocation  Full time employment    Engineer, site of transportation services, 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 Still working with modifications.     Leisure  likes to be active so she is miserable right now. Workout, run, [employer], play with kids. 2-3 miles a  couple of times a week (has not tried running), peliton bike.  Household, taking care of the kids, 58, 51, 59 years old.       Cognition   Overall Cognitive Status  Within Functional Limits for tasks assessed      Observation/Other Assessments   Observations  seen note from 08/31/2019 for latest objective data    Focus on Therapeutic Outcomes (FOTO)   FOTO=83 (08/31/2019)      Objective:  Lumbar extension. WFL no pain.  FOTO: filled at end of session (unbilled) Score is 83 5/5 bilaterally hip extension no pain. Self reports: able do workout but haven't trialed with running yet.    TREATMENT: Therapeutic exercise:to centralize symptoms and improve ROM, strength, muscular endurance, and activity tolerance required for successful completion of functional activities. - Treadmillwalkingat3.33mh with 10%grade and running 4.5-5.0 mphwith 0 grade.For improved lower extremity mobility, muscular endurance, and weightbearing activity tolerance; and to induce the analgesic effect of aerobic exercise, stimulate improved joint nutrition, and prepare body structures and systems for following interventions.X10 minutes. - Standing lumbar extension x 15 reps no pain  - Squat x 10 reps   - Squat with 5lb dumbell at each hand x 10   - Squat on bosu ball 2x10. Reporting muscle tightness starting at lower back but no pain once off the position  - Lats pull down 3x10 reps (25/35/45lb per set) with cuing to improve postures. - Jogging on level ground 50 feet x 3 reps between exercises to assess pain control.  - Patient was educated on diagnosis, anatomy and pathology involved, prognosis, role of PT, and was given an updated running program starting with 1539ms (39m16m walking and 1mi839malking alternating with running for the rest 10 mins). Pt was educated on and agreed to plan of care.   HOME EXERCISE PROGRAM Access Code: [email or web address] URL: [email or web address] Date: 08/16/2019   Prepared by: SaraRosita Kearogram Notes  For all strenghtening exercises (new ones) Make sure you squeeze abdominal muscles and glutes during entire exercise. This is more important than actually completing the exercise.   Think of pinching a penny between the glutes to help activate properly.   Exercises Pron[place]bar Extension Press Up - 10-15 reps - 1 second hold - 4x daily Standard Plank - 2 reps - 30 seconds hold - 1x daily - 7x weekly Side Plank on Elbow - 2 reps - 15 seconds hold - 1x daily - 7x weekly Full Plank with Shoulder Taps - 2 sets - 10 reps - 1x daily - 7x weekly Single Leg Bridge - 3 sets - 10 reps - 1 second hold - 1 every other day Hip Hikes off step - 3 sets - 10 reps - 1 every other day  Lateral Step Down - 3 sets - 10 reps - every other day hold Piriformis Mobilization with Small [employer] Squat - 3 sets - 10 reps - 1x daily - 7x weekly   Patient has attended 10 skilled physical therapy treatment sessions this episode of care and overall presents with good progress towards stated goals. Subjectively, patient reports feeling increased activity tolerance, ROM, self care pain control and pain reduction at stated no pain at the end of today's visit. Objectively, patient demonstrates good progression with increased activity tolerance (return to workout), reduction of pain(0/10) and pain free ROM(no pain with lumbar extension), as well as functional mobilities (FOTO).  Patient has been showing good gain in the previous PT treatment. However, patient continues to present deficits in pain and activity tolerance (prolonged sitting, running) that are limiting ability to complete her usual ADLs, IADLs, community participation and recreational activities. patient has not successfully returned to prior level of function running several times a week and this will be a focus of future visits. Patient will benefit from continued skilled physical therapy intervention to  address current body structure impairments and activity limitations to improve function and work towards goals set in current POC in order to return to prior level of function or maximal functional improvement.     PT Education - 08/31/19 1333    Education Details  Exercise purpose/form. Self management techniques.    Person(s) Educated  Patient    Methods  Explanation;Demonstration;Tactile cues;Verbal cues    Comprehension  Returned demonstration;Verbalized understanding;Verbal cues required;Tactile cues required       PT Short Term Goals - 07/26/19 1757      PT SHORT TERM GOAL #1   Title  Be independent with initial home exercise program for self-management of symptoms.    Baseline  initial HEP provided at IE (07/21/2019);    Time  2    Period  Weeks    Status  Achieved    Target Date  08/05/19        PT Long Term Goals - 08/31/19 0911      PT LONG TERM GOAL #1   Title  Demonstrate improved FOTO score to equal or greater than 87 to demonstrate improvement in overall condition and self-reported functional ability.    Baseline  FOTO = 63 (07/21/2019); FOTO = 83 (08/31/2019)    Time  6    Period  Weeks    Status  Partially Met    Target Date  10/12/19      PT LONG TERM GOAL #2   Title  Have full lumbar AROM with no compensations or increase in pain in all planes except intermittent end range discomfort to allow patient to complete valued activities with less difficulty.    Baseline  painful, see objective exam (07/21/2019); No pain with lumbar extension (08/31/2019)    Time  6    Period  Weeks    Status  Achieved    Target Date  10/12/19      PT LONG TERM GOAL #3   Title  Bilateral hip extension strength will be equal or greater than 4+/5 and pain free to improve ability to return to ADLs and fitness activities without difficulty.    Baseline  R hip extension initially at 3+/5, worst with knee extended (07/21/2019); 5/5 bilaterally hip extension (08/31/2019)    Time  6     Period  Weeks    Status  Achieved    Target Date  10/12/19  PT LONG TERM GOAL #4   Title  Complete community, work and/or recreational activities without limitation due to current condition.    Baseline  currently unable to run or work out as usual, limiting lifting, etc (07/21/2019); able to workout without pain but haven't trial running yet.(08/31/2019)    Time  6    Period  Weeks    Status  Partially Met    Target Date  10/12/19      PT LONG TERM GOAL #5   Title  Be independent with a long-term home exercise program for self-management of symptoms.    Baseline  initial HEP provided at IE (07/21/2019); continue updating 08/31/2019    Time  6    Period  Weeks    Status  Partially Met    Target Date  10/12/19      Additional Long Term Goals   Additional Long Term Goals  Yes      PT LONG TERM GOAL #6   Title  Patient will able to complete graded running program (HEP) for 45 minutes without pain to ensure a safe return to running.    Baseline  Started 10 mins running program (08/31/2019)    Time  6    Period  Weeks    Status  New    Target Date  10/12/19            Plan - 08/31/19 1334    Clinical Impression Statement  Patient has attended 10 skilled physical therapy treatment sessions this episode of care and overall presents with good progress towards stated goals. Subjectively, patient reports feeling increased activity tolerance, ROM, self care pain control and pain reduction at stated no pain at the end of today's visit. Objectively, patient demonstrates good progression with increased activity tolerance (return to workout), reduction of pain(0/10) and pain free ROM(no pain with lumbar extension), as well as functional mobilities (FOTO).  Patient has been showing good gain in the previous PT treatment. However, patient continues to present deficits in pain and activity tolerance (prolonged sitting, running) that are limiting ability to complete her usual ADLs, IADLs,  community participation and recreational activities. Patient will benefit from continued skilled physical therapy intervention to address current body structure impairments and activity limitations to improve function and work towards goals set in current POC in order to return to prior level of function or maximal functional improvement    Personal Factors and Comorbidities  Age;Education;Behavior Pattern;Social Background;Fitness;Past/Current Experience;Comorbidity 1    Comorbidities  history of R hip pain    Examination-Activity Limitations  Bend;Caring for Others;Carry;Lift;Transfers;Squat;Sit;Dressing;Stand;Bed Mobility   running, lifting weights   Examination-Participation Restrictions  Other;Community Activity;Interpersonal Relationship;Cleaning   work, farm Mudlogger, fitness activities   Stability/Clinical Decision Making  Stable/Uncomplicated    Rehab Potential  Good    PT Frequency  2x / week    PT Duration  6 weeks    PT Treatment/Interventions  ADLs/Self Care Home Management;Cryotherapy;Electrical Stimulation;Moist Heat;Therapeutic activities;Therapeutic exercise;Balance training;Neuromuscular re-education;Patient/family education;Manual techniques;Manual lymph drainage;Dry needling;Passive range of motion;Spinal Manipulations;Joint Manipulations    PT Next Visit Plan  continue core, glute, and functional progression as tolerated. specific exercise for pain control    PT Home Exercise Plan  Access Code: [email or web address]    Consulted and Agree with Plan of Care  Patient       Patient will benefit from skilled therapeutic intervention in order to improve the following deficits and impairments:  Decreased strength, Impaired flexibility, Increased muscle spasms, Pain, Decreased activity tolerance, Decreased  endurance, Decreased mobility, Difficulty walking, Impaired perceived functional ability, Increased fascial restricitons, Decreased range of motion  Visit Diagnosis: Acute left-sided low  back pain without sciatica  Muscle weakness (generalized)  Pain in right hip     Problem List Patient Active Problem List   Diagnosis Date Noted  . Acute left-sided low back pain 07/19/2019  . Rash/skin eruption 07/16/2019  . Preventative health care 04/16/2018    Sherrilyn Rist, SPT 08/31/19, 7:26 PM  Everlean Alstrom. Graylon Good, PT, DPT 08/31/19, 7:28 PM  [facility] [address] [place], [address] Phone: [phone]   Fax:  [phone]  Name: Lori Mcgee MRN: [id number] Date of Birth: [birth year]

## 2019-09-02 ENCOUNTER — Ambulatory Visit: Payer: No Typology Code available for payment source | Admitting: Physical Therapy

## 2019-09-06 DIAGNOSIS — M542 Cervicalgia: Secondary | ICD-10-CM

## 2019-09-06 DIAGNOSIS — G8929 Other chronic pain: Secondary | ICD-10-CM

## 2019-09-07 ENCOUNTER — Encounter: Payer: Self-pay | Admitting: Physical Therapy

## 2019-09-07 ENCOUNTER — Other Ambulatory Visit: Payer: Self-pay

## 2019-09-07 ENCOUNTER — Ambulatory Visit: Payer: No Typology Code available for payment source | Attending: Family Medicine | Admitting: Physical Therapy

## 2019-09-07 DIAGNOSIS — R202 Paresthesia of skin: Secondary | ICD-10-CM | POA: Diagnosis present

## 2019-09-07 DIAGNOSIS — G8929 Other chronic pain: Secondary | ICD-10-CM | POA: Diagnosis present

## 2019-09-07 DIAGNOSIS — M6281 Muscle weakness (generalized): Secondary | ICD-10-CM | POA: Diagnosis present

## 2019-09-07 DIAGNOSIS — M25512 Pain in left shoulder: Secondary | ICD-10-CM | POA: Diagnosis present

## 2019-09-07 DIAGNOSIS — M25551 Pain in right hip: Secondary | ICD-10-CM | POA: Insufficient documentation

## 2019-09-07 DIAGNOSIS — M545 Low back pain, unspecified: Secondary | ICD-10-CM

## 2019-09-07 DIAGNOSIS — M542 Cervicalgia: Secondary | ICD-10-CM | POA: Insufficient documentation

## 2019-09-07 NOTE — Therapy (Signed)
[facility] [address], [place], [address] Phone: [phone]   Fax:  [phone]  Physical Therapy Treatment  Patient Details  Name: Lori Mcgee MRN: [id number] Date of Birth: [birth year] Referring Provider (PT): Glori Bickers, Wynelle Fanny, MD   Encounter Date: 09/07/2019  PT End of Session - 09/07/19 1712    Visit Number  11    Number of Visits  22    Date for PT Re-Evaluation  10/12/19    Authorization Type  [employer] reporting period from 07/21/2019    Authorization Time Period  Current cert period: 6/46/8032 - 10/14/2019    Authorization - Visit Number  1    Authorization - Number of Visits  10    PT Start Time  1714    PT Stop Time  1800    PT Time Calculation (min)  46 min    Activity Tolerance  Patient tolerated treatment well    Behavior During Therapy  [facility] for tasks assessed/performed       Past Medical History:  Diagnosis Date  . BV (bacterial vaginosis)     Past Surgical History:  Procedure Laterality Date  . HYSTEROSCOPY N/A 10/31/2016   Procedure: HYSTEROSCOPY WITH IUD REMOVAL;  Surgeon: Rubie Maid, MD;  Location: [facility] ORS;  Service: Gynecology;  Laterality: N/A;  . iud extraction  10/31/2016  . WISDOM TOOTH EXTRACTION      There were no vitals filed for this visit.   Subjective Assessment - 09/07/19 1711    Subjective  Patient reports no pain at hip but shoulder pain continues to bother her. Patient states her hip only felt tight after running. Patient has been able to run 1.5 miles recently but felt R hip tightness right after the running as well as the day after.    Pertinent History  Patient is a 33 y.o. female who presents to outpatient physical therapy with a referral for medical diagnosis acute left-sided low back pain without sciatica. This patient's chief complaints consist of pain in the L glute and lumbar spine causing difficulty with motions involving lumbopelvic  region leading to the following functional deficits: difficulty sitting, bending, lifting, twisting, transferring, prolonged standing, bed mobility. Patient reports sudden onset while stepping backwards 07/18/2019 and no prior history of this type of problem. Relevant past medical history and comorbidities include patient was recently treated for poison ivy that caused a rash over the left flank taking a 15 day taper of prednisone that ended yesterday. History of R hip pain earlier this year that has resolved.    Limitations  Sitting;Other (comment);House hold activities;Lifting   sitting, twisting, lumbar extension, anterior pelvic tilt, rolling in bed, getting in and out car, standing up and sitting down.   How long can you sit comfortably?  none    Diagnostic tests  none    Patient Stated Goals  resolve pain and return to prior level of activity    Currently in Pain?  No/denies    Pain Score  0-No pain    Pain Onset  1 to 4 weeks ago       Therapeutic exercise: to centralize symptoms and improve ROM, strength, muscular endurance, and activity tolerance required for successful completion of functional activities.  - Running on the treadmill for stress testing and muscle endurance to assess pain at R hip.(in minutes; W: walking; R: Running) (3W-1.5R-0.5W-1.5R-0.5W-2R-0.5W-3R-1W-4R)15 mins combined running and walking. No pain immediate after the running. Tightness at R  hip.  - Assisted supine figure 4 stretching 3x30 s on R LE.  - Standing figure 4 stretching 4x15 s on R LE - Gluteal medius stretching at R hip   - Machine LE strengthening  - Standing extension 40-50lb  - Standing adduction 20-40 lb   - Standing abduction    - L: 25 lb Pain at R hip, therefore reduced weight from 50 lb to 25lb    - R: 40-50 lb  - Standing side walk with red therapy band at ankle. 10 steps x 3 reps. Cuing to put hands at pelvis Cuing provided throughout the sessions to improve pelvic stability, proper forms for  muscle activation, as well as techniques.   HOME EXERCISE PROGRAM Access Code: [email or web address] URL: [email or web address] Date: 08/16/2019  Prepared by: Rosita Kea   Program Notes  For all strenghtening exercises (new ones) Make sure you squeeze abdominal muscles and glutes during entire exercise. This is more important than actually completing the exercise.   Think of pinching a penny between the glutes to help activate properly.   Exercises [place] Lumbar Extension Press Up - 10-15 reps - 1 second hold - 4x daily Standard Plank - 2 reps - 30 seconds hold - 1x daily - 7x weekly Side Plank on Elbow - 2 reps - 15 seconds hold - 1x daily - 7x weekly Full Plank with Shoulder Taps - 2 sets - 10 reps - 1x daily - 7x weekly Single Leg Bridge - 3 sets - 10 reps - 1 second hold - 1 every other day Hip Hikes off step - 3 sets - 10 reps - 1 every other day Lateral Step Down - 3 sets - 10 reps - every other day hold Piriformis Mobilization with Small [employer] Squat - 3 sets - 10 reps - 1x daily - 7x weekly   Patient tolerated treatment well and continues to make progress towards goals. Patient demonstrated improved proper muscle activation pattern within session today. Mild increase of R gluteal medius pain noted and proper stretching exercises provided. Significant pain and weakness at R hip presented at today's session and future session will focus on LE strengthening, education on pain control, as well as stretching. Patient would benefit from continued physical therapy to address remaining impairments and functional limitations to work towards stated goals and return to PLOF or maximal functional independence.    PT Education - 09/07/19 1712    Education Details  Exercise purpose/form. Self management techniques.    Person(s) Educated  Patient    Methods  Explanation;Demonstration;Tactile cues;Verbal cues    Comprehension  Verbalized understanding;Returned  demonstration;Verbal cues required;Tactile cues required       PT Short Term Goals - 07/26/19 1757      PT SHORT TERM GOAL #1   Title  Be independent with initial home exercise program for self-management of symptoms.    Baseline  initial HEP provided at IE (07/21/2019);    Time  2    Period  Weeks    Status  Achieved    Target Date  08/05/19        PT Long Term Goals - 08/31/19 0911      PT LONG TERM GOAL #1   Title  Demonstrate improved FOTO score to equal or greater than 87 to demonstrate improvement in overall condition and self-reported functional ability.    Baseline  FOTO = 63 (07/21/2019); FOTO = 83 (08/31/2019)    Time  6    Period  Weeks    Status  Partially Met    Target Date  10/12/19      PT LONG TERM GOAL #2   Title  Have full lumbar AROM with no compensations or increase in pain in all planes except intermittent end range discomfort to allow patient to complete valued activities with less difficulty.    Baseline  painful, see objective exam (07/21/2019); No pain with lumbar extension (08/31/2019)    Time  6    Period  Weeks    Status  Achieved    Target Date  10/12/19      PT LONG TERM GOAL #3   Title  Bilateral hip extension strength will be equal or greater than 4+/5 and pain free to improve ability to return to ADLs and fitness activities without difficulty.    Baseline  R hip extension initially at 3+/5, worst with knee extended (07/21/2019); 5/5 bilaterally hip extension (08/31/2019)    Time  6    Period  Weeks    Status  Achieved    Target Date  10/12/19      PT LONG TERM GOAL #4   Title  Complete community, work and/or recreational activities without limitation due to current condition.    Baseline  currently unable to run or work out as usual, limiting lifting, etc (07/21/2019); able to workout without pain but haven't trial running yet.(08/31/2019)    Time  6    Period  Weeks    Status  Partially Met    Target Date  10/12/19      PT LONG TERM GOAL  #5   Title  Be independent with a long-term home exercise program for self-management of symptoms.    Baseline  initial HEP provided at IE (07/21/2019); continue updating 08/31/2019    Time  6    Period  Weeks    Status  Partially Met    Target Date  10/12/19      Additional Long Term Goals   Additional Long Term Goals  Yes      PT LONG TERM GOAL #6   Title  Patient will able to complete graded running program (HEP) for 45 minutes without pain to ensure a safe return to running.    Baseline  Started 10 mins running program (08/31/2019)    Time  6    Period  Weeks    Status  New    Target Date  10/12/19            Plan - 09/07/19 1713    Personal Factors and Comorbidities  Age;Education;Behavior Pattern;Social Background;Fitness;Past/Current Experience;Comorbidity 1    Comorbidities  history of R hip pain    Examination-Activity Limitations  Bend;Caring for Others;Carry;Lift;Transfers;Squat;Sit;Dressing;Stand;Bed Mobility   running, lifting weights   Examination-Participation Restrictions  Other;Community Activity;Interpersonal Relationship;Cleaning   work, farm Mudlogger, fitness activities   Stability/Clinical Decision Making  Stable/Uncomplicated    Rehab Potential  Good    PT Frequency  2x / week    PT Duration  6 weeks    PT Treatment/Interventions  ADLs/Self Care Home Management;Cryotherapy;Electrical Stimulation;Moist Heat;Therapeutic activities;Therapeutic exercise;Balance training;Neuromuscular re-education;Patient/family education;Manual techniques;Manual lymph drainage;Dry needling;Passive range of motion;Spinal Manipulations;Joint Manipulations    PT Next Visit Plan  continue core, glute, and functional progression as tolerated. specific exercise for pain control    PT Home Exercise Plan  Access Code: [email or web address]    Consulted and Agree with Plan of Care  Patient       Patient will benefit from skilled  therapeutic intervention in order to improve the following  deficits and impairments:  Decreased strength, Impaired flexibility, Increased muscle spasms, Pain, Decreased activity tolerance, Decreased endurance, Decreased mobility, Difficulty walking, Impaired perceived functional ability, Increased fascial restricitons, Decreased range of motion  Visit Diagnosis: Acute left-sided low back pain without sciatica  Muscle weakness (generalized)  Pain in right hip     Problem List Patient Active Problem List   Diagnosis Date Noted  . Acute left-sided low back pain 07/19/2019  . Rash/skin eruption 07/16/2019  . Preventative health care 04/16/2018    Sherrilyn Rist, SPT 09/07/19, 6:33 PM  Everlean Alstrom. Graylon Good, PT, DPT 09/07/19, 6:54 PM   [facility] [address] [place], [address] Phone: [phone]   Fax:  [phone]  Name: Lori Mcgee MRN: [id number] Date of Birth: January 07, 1986

## 2019-09-09 ENCOUNTER — Ambulatory Visit: Payer: No Typology Code available for payment source | Admitting: Physical Therapy

## 2019-09-14 ENCOUNTER — Ambulatory Visit: Payer: No Typology Code available for payment source | Admitting: Physical Therapy

## 2019-09-14 ENCOUNTER — Other Ambulatory Visit: Payer: Self-pay

## 2019-09-14 DIAGNOSIS — R202 Paresthesia of skin: Secondary | ICD-10-CM

## 2019-09-14 DIAGNOSIS — M545 Low back pain, unspecified: Secondary | ICD-10-CM

## 2019-09-14 DIAGNOSIS — G8929 Other chronic pain: Secondary | ICD-10-CM

## 2019-09-14 DIAGNOSIS — M6281 Muscle weakness (generalized): Secondary | ICD-10-CM

## 2019-09-14 DIAGNOSIS — M542 Cervicalgia: Secondary | ICD-10-CM

## 2019-09-14 DIAGNOSIS — M25551 Pain in right hip: Secondary | ICD-10-CM

## 2019-09-14 DIAGNOSIS — M25512 Pain in left shoulder: Secondary | ICD-10-CM

## 2019-09-14 NOTE — Therapy (Signed)
[facility] [address], [place], [address] Phone: [phone]   Fax:  [phone]  Physical Therapy Re-Evaluation  Reason for re-evaluation: change POC due to new physician's referral for cervical spine and need to alter POC to include evaluation and treatment for this body region.   Patient Details  Name: Lori Mcgee MRN: [id number] Date of Birth: 05-May-1986 Referring Provider (PT): Tower, Wynelle Fanny, MD   Encounter Date: 09/14/2019  PT End of Session - 09/14/19 1928    Visit Number  12    Number of Visits  22    Date for PT Re-Evaluation  10/26/19    Authorization Type  [employer] reporting period from 07/21/2019    Authorization Time Period  Current cert period: 0/34/9179 - 10/14/2019    Authorization - Visit Number  2    Authorization - Number of Visits  10    PT Start Time  1740    PT Stop Time  1838    PT Time Calculation (min)  58 min    Activity Tolerance  Patient tolerated treatment well    Behavior During Therapy  [facility] for tasks assessed/performed       Past Medical History:  Diagnosis Date  . BV (bacterial vaginosis)     Past Surgical History:  Procedure Laterality Date  . HYSTEROSCOPY N/A 10/31/2016   Procedure: HYSTEROSCOPY WITH IUD REMOVAL;  Surgeon: Rubie Maid, MD;  Location: [facility] ORS;  Service: Gynecology;  Laterality: N/A;  . iud extraction  10/31/2016  . WISDOM TOOTH EXTRACTION      There were no vitals filed for this visit.  Subjective Assessment - 09/14/19 1919    Subjective  Patient report mainly pain today at L shoulder. Patient's chief complain is neck and shoulder pain at L side. Occasionally numbness and tingling at L ring and little finger. The pain is insidious onset right after her youngest child born 71 years. Chronic neck pain in nature and patient has been seen chiropractor since she played with [employer] roller [employer] about 4-5 years ago. The most recent  flare up was on last Thursday and it normally took 1-2 days for her neck to be able to turn and 1-2 weeks for the pain to return to baseline. She continues to have mild soreness at her R glute but agrees to focus on evaluation of her neck/shoulder pain since her doctor sent over a new referral to add this body part to her POC.    Pertinent History  Patient is a 33 y.o. female who presents to outpatient physical therapy with a referral for medical diagnosis acute left-sided low back pain without sciatica. This patient's chief complaints consist of pain in the L glute and lumbar spine causing difficulty with motions involving lumbopelvic region leading to the following functional deficits: difficulty sitting, bending, lifting, twisting, transferring, prolonged standing, bed mobility. Patient reports sudden onset while stepping backwards 07/18/2019 and no prior history of this type of problem. Relevant past medical history and comorbidities include patient was recently treated for poison ivy that caused a rash over the left flank taking a 15 day taper of prednisone that ended yesterday. History of R hip pain earlier this year that has resolved.    Limitations  Sitting;Other (comment);House hold activities;Lifting    Diagnostic tests  none    Patient Stated Goals  resolve pain and return to prior level of activity    Currently in Pain?  Yes  Pain Score  0-No pain    Pain Location  Hip    Pain Orientation  Right    Pain Descriptors / Indicators  Sore    Pain Type  Acute pain    Pain Radiating Towards  low back to L glute. Denies numbness and tingling    Pain Onset  1 to 4 weeks ago    Pain Frequency  Intermittent    Aggravating Factors   sitting, twisting, lumbar extension, anterior pelvic tilt, rolling in bed, getting in and out car, standing up and sitting down.    Pain Relieving Factors  laying on side with pillow between legs to sleep (either side - tries to lay on R side), standing, walking,  (Ice/heat hasn't helped at all.) 24-hour pattern: none.    Effect of Pain on Daily Activities  difficulty sitting, bending, lifting, twisting, transferring, prolonged standing, bed mobility, work activities    Multiple Pain Sites  Yes    Pain Score  2    Pain Location  Neck    Pain Orientation  Left    Pain Descriptors / Indicators  Aching;Constant;Dull;Sharp    Pain Type  Chronic pain    Pain Radiating Towards  neck and shoulder pain at L side. Occasionally numbness and tingling at L ring and little finger    Pain Onset  More than a month ago    Pain Frequency  Occasional    Aggravating Factors   L shoulder moving towards end range in flexion, abduction; sleep on L side    Pain Relieving Factors  resting, message, stretching    Effect of Pain on Daily Activities  Driving, looking around, over head reaching, taking care for others(children)      [facility] PT Assessment - 09/15/19 0001      Assessment   Medical Diagnosis  acute left-sided low back pain without sciatica; chronic neck pain    Referring Provider (PT)  Tower, Wynelle Fanny, MD    Onset Date/Surgical Date  07/18/19   neck chronic, 4-5 years ago significant worsening   Hand Dominance  Right    Prior Therapy  none for this problem prior to this episode of care      Precautions   Precautions  None      Restrictions   Weight Bearing Restrictions  No      Prior Function   Level of Independence  Independent    Vocation  Full time employment    Engineer, site of transportation services, 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 Still working with modifications.     Leisure  likes to be active so she is miserable right now. Workout, run, [employer], play with kids. 2-3 miles a couple of times a week (has not tried running), peliton bike.  Household, taking care of the kids, 55, 28, 31 years old.       Cognition   Overall Cognitive Status  Within Functional Limits for tasks assessed       Observation/Other Assessments   Observations  seen note from 08/31/2019 and 09/14/2019 for latest objective data    Focus on Therapeutic Outcomes (FOTO)   FOTO=83 (08/31/2019)    Neck Disability Index   20%       OBJECTIVE  SENSATION: Grossly intact to light touch bilateral UE as determined by testing dermatomes C2-T2 except light feeling at R little finger compare to L side.   MUSCULOSKELETAL: Tremor: None Bulk: Normal Tone: Normal  Posture Forward neck posture and protracted shoulder.   Palpation Tender to touch at C2 with CPA; tender to touch at L shoulder AC joint and L upper trap.   Strength R/L 5/5 Shoulder flexion (anterior deltoid/pec major/coracobrachialis, axillary n. (C5/6) and musculocutaneous n. (C5-7)) 5/5 Shoulder abduction (deltoid/supraspinatus, axillary/suprascapular n, C5) 5/5 Shoulder external rotation (infraspinatus/teres minor) 5/5 Shoulder internal rotation (subcapularis/lats/pec major) 5/5* Shoulder extension (posterior deltoid, lats, teres major, axillary/thoracodorsal n.) 4+/4+* Shoulder horizontal abduction 5/5 Elbow flexion (biceps brachii, brachialis, brachioradialis, musculoskeletal n, C5/6) 5/5 Elbow extension (triceps, radial n, C7) 5/5 Wrist Extension (C6/7) 5/5 Wrist Flexion (C6/7)  AROM R/L 55 Cervical Flexion 53 Cervical Extension* concordant pain 45/40* Cervical Lateral Flexion, concordant pain 62/40* Cervical Rotation *Indicates pain   Passive Accessory Intervertebral Motion (PAIVM) Pt denies reproduction of neck pain with CPA C2-T7 and UPA bilaterally C2-T7. Generally hypomobile throughout  Passive Physiological Intervertebral Motion (PPIVM) Reproduction of concordant pain with L sidebending with overpressure and gentle side glide at mid cervical spine.  Reproduction of concordant pain with R sidebending with overpressure and gentle side glide at C2.   SPECIAL TESTS Alar ligament: negative in extension, neutral, flexion Axial  compression and distraction: negative Spurlings: positive L for more posterior upper trap pain (not concordant); R increases L sided pain.  ULTT Median: Negative ULTT Ulnar: negative ULTT Radial: negative Hawkins-Kennedy: L = positive Speed's Test (I, II, active): all positive on L Yerrgasen's test: positive on L Supine Neer's: positive on L    TREATMENT:  Therapeutic exercise: to centralize symptoms and improve ROM, strength, muscular endurance, and activity tolerance required for successful completion of functional activities.  - Deep neck flexor training in supine with cuing to tuck the chin. x10 reps  - Seated shoulder retraction x 5 reps. Cuing to improve cervical posture and chin tuck.  - Seated thoracic extension x 10 reps. Pain at end range. Modification for reduced extension ROM.  -Patient was educated on diagnosis, anatomy and pathology involved, prognosis, role of PT, and was given an HEP, demonstrating exercise with proper form following verbal and tactile cues, and was given a paper hand out to continue exercise at home. Pt was educated on and agreed to plan of care.   HOME EXERCISE PROGRAM Lumbar Spine: Access Code: [email or web address] URL: [email or web address] Date: 08/16/2019  Prepared by: [place] Lumbar Extension Press Up - 10-15 reps - 1 second hold - 4x daily Standard Plank - 2 reps - 30 seconds hold - 1x daily - 7x weekly Side Plank on Elbow - 2 reps - 15 seconds hold - 1x daily - 7x weekly Full Plank with Shoulder Taps - 2 sets - 10 reps - 1x daily - 7x weekly Single Leg Bridge - 3 sets - 10 reps - 1 second hold - 1 every other day Hip Hikes off step - 3 sets - 10 reps - 1 every other day Lateral Step Down - 3 sets - 10 reps - every other day hold Piriformis Mobilization with Small [employer] Squat - 3 sets - 10 reps - 1x daily - 7x weekly  Cervical Spine: Access Code: [email or web address]  URL:  [email or web address]  Date: 09/14/2019  Prepared by: Rosita Kea   Exercises  Supine Head Nod Deep Neck Flexor Training - 3 sets - 10 reps - 1x daily  Seated Scapular Protraction and Retraction - 3 sets - 10 reps - 1x daily  Neck Sidebend Stretch - 3 sets - 30s hold - 1x daily  PT Education - 09/14/19 1925    Education Details  Exercise purpose/form. Self management techniques. Education on diagnosis, prognosis, POC, anatomy and physiology of current condition Education on HEP including handout    Person(s) Educated  Patient    Methods  Explanation;Demonstration;Tactile cues;Verbal cues;Handout    Comprehension  Verbalized understanding;Returned demonstration;Tactile cues required;Verbal cues required       PT Short Term Goals - 09/14/19 1933      PT SHORT TERM GOAL #1   Title  Be independent with initial home exercise program for self-management of symptoms.    Baseline  initial HEP provided at IE (07/21/2019); updated HEP (09/14/2019)    Time  2    Period  Weeks    Status  Revised    Target Date  09/28/19        PT Long Term Goals - 09/14/19 1934      PT LONG TERM GOAL #1   Title  Demonstrate improved FOTO score to equal or greater than 87 to demonstrate improvement in overall condition and self-reported functional ability.    Baseline  FOTO = 63 (07/21/2019); FOTO = 83 (08/31/2019)    Time  6    Period  Weeks    Status  Partially Met    Target Date  10/26/19      PT LONG TERM GOAL #2   Title  Have full lumbar AROM with no compensations or increase in pain in all planes except intermittent end range discomfort to allow patient to complete valued activities with less difficulty.    Baseline  painful, see objective exam (07/21/2019); No pain with lumbar extension (08/31/2019)    Time  6    Period  Weeks    Status  Achieved    Target Date  10/26/19      PT LONG TERM GOAL #3   Title  Bilateral hip extension strength will be equal or greater than 4+/5  and pain free to improve ability to return to ADLs and fitness activities without difficulty.    Baseline  R hip extension initially at 3+/5, worst with knee extended (07/21/2019); 5/5 bilaterally hip extension (08/31/2019)    Time  6    Period  Weeks    Status  Achieved    Target Date  10/26/19      PT LONG TERM GOAL #4   Title  Complete community, work and/or recreational activities without limitation due to current condition.    Baseline  currently unable to run or work out as usual, limiting lifting, etc (07/21/2019); able to workout without pain but haven't trial running yet.(08/31/2019); have been able to return to short and light running but having difficulty managing graded return and lacks stability in improvement and is not yet back to prior 4 days a week unrestricted; also is limited in reaching, turning head, driving, caring for children (09/14/2019);    Time  6    Period  Weeks    Status  Partially Met    Target Date  10/26/19      PT LONG TERM GOAL #5   Title  Be independent with a long-term home exercise program for self-management of symptoms.    Baseline  initial HEP provided at IE (07/21/2019); continue updating 08/31/2019; cervical spine added 09/14/2019);    Time  6    Period  Weeks    Status  Partially Met    Target Date  10/26/19      Additional Long Term Goals  Additional Long Term Goals  Yes      PT LONG TERM GOAL #6   Title  Patient will able to complete graded running program (HEP) for 45 minutes without pain to ensure a safe return to running.    Baseline  Started 10 mins running program (08/31/2019)    Time  6    Period  Weeks    Status  On-going    Target Date  10/26/19      PT LONG TERM GOAL #7   Title  Reduce L shoulder and neck max pain with functional activities to equal or less than 1/10 to allow patient to complete usual activities including ADLs, IADLs, and social engagement with less difficulty.    Baseline  7-8/10 max pain (09/14/2019)    Time   6    Period  Weeks    Status  New    Target Date  10/26/19      PT LONG TERM GOAL #8   Title  Increase L side cervical rotation ROM to 60 degrees to be able to check blind spots when driving.    Baseline  40 degrees with pain (09/14/2019)    Time  6    Period  Weeks    Status  New    Target Date  10/26/19            Plan - 09/14/19 1930    Clinical Impression Statement  Patient is a 33 y.o. female who presents to outpatient physical therapy with an additional referral for medical diagnosis of acute left sided low back pain without sciatica and L neck pain . This patient presents with the sign and symptoms consistent with L side neck and shoulder pain with stiffness, pain at end range with functional activities/recreational activities as well as R glute pain that is limiting her return to running during functional recovery phase of low back rehabilitation. Upon assessment, pt demonstrated deficits such as deficits in strength, weakness, significant pain with end range mobility, and limited cervical ROM as well as continued but improving deficits in lumbopelvic coordination, single leg stability and strength, R glute endurance, and decreased activity tolerance for running. These deficits limit the patient ability to perform things such as ADLs, IADLs, social participation, caring for others, and impairs their quality of life. The pt will benefit from skilled PT services to address deficits and return to PLOF and independence, recreational activity and work.    Personal Factors and Comorbidities  Age;Education;Behavior Pattern;Social Background;Fitness;Past/Current Experience;Comorbidity 1    Comorbidities  history of R hip pain; chronic L shoulder pain    Examination-Activity Limitations  Bend;Caring for Others;Carry;Lift;Transfers;Squat;Sit;Dressing;Stand;Bed Mobility;Reach Overhead   running, lifting weights   Examination-Participation Restrictions  Other;Community Activity;Interpersonal  Relationship;Cleaning;Driving   work, farm Mudlogger, fitness activities   Stability/Clinical Decision Making  Evolving/Moderate complexity    Clinical Decision Making  Moderate    Rehab Potential  Good    PT Frequency  2x / week    PT Duration  6 weeks    PT Treatment/Interventions  ADLs/Self Care Home Management;Cryotherapy;Electrical Stimulation;Moist Heat;Therapeutic activities;Therapeutic exercise;Balance training;Neuromuscular re-education;Patient/family education;Manual techniques;Dry needling;Passive range of motion;Spinal Manipulations;Joint Manipulations;Traction    PT Next Visit Plan  continue core, glute, and functional progression as tolerated. continue progressing cervical spine/shoulder strengthening as tolerated    PT Home Exercise Plan  Medbridge: Lumbar spine Access Code: [email or web address]; cervical spine Access Code: [email or web address]    Consulted and Agree with Plan of Care  Patient  Patient will benefit from skilled therapeutic intervention in order to improve the following deficits and impairments:  Decreased strength, Impaired flexibility, Increased muscle spasms, Pain, Decreased activity tolerance, Decreased endurance, Decreased mobility, Difficulty walking, Impaired perceived functional ability, Increased fascial restricitons, Decreased range of motion, Impaired UE functional use, Postural dysfunction, Hypermobility, Improper body mechanics  Visit Diagnosis: Acute left-sided low back pain without sciatica  Muscle weakness (generalized)  Pain in right hip  Cervicalgia  Chronic left shoulder pain  Paresthesia of skin     Problem List Patient Active Problem List   Diagnosis Date Noted  . Acute left-sided low back pain 07/19/2019  . Rash/skin eruption 07/16/2019  . Preventative health care 04/16/2018    Sherrilyn Rist, SPT 09/15/19, 9:38 AM  Everlean Alstrom. Graylon Good, PT, DPT 09/15/19, 9:38 AM  [facility] [address] [place], [address] Phone: [phone]   Fax:  [phone]  Name: MELONEY FELD MRN: [id number] Date of Birth: 06/01/1986

## 2019-09-15 ENCOUNTER — Encounter: Payer: Self-pay | Admitting: Physical Therapy

## 2019-09-16 ENCOUNTER — Ambulatory Visit: Payer: No Typology Code available for payment source | Admitting: Physical Therapy

## 2019-09-16 ENCOUNTER — Other Ambulatory Visit: Payer: Self-pay

## 2019-09-16 ENCOUNTER — Encounter: Payer: Self-pay | Admitting: Physical Therapy

## 2019-09-16 DIAGNOSIS — M545 Low back pain, unspecified: Secondary | ICD-10-CM

## 2019-09-16 DIAGNOSIS — M6281 Muscle weakness (generalized): Secondary | ICD-10-CM

## 2019-09-16 DIAGNOSIS — M25551 Pain in right hip: Secondary | ICD-10-CM

## 2019-09-16 DIAGNOSIS — R202 Paresthesia of skin: Secondary | ICD-10-CM

## 2019-09-16 DIAGNOSIS — M542 Cervicalgia: Secondary | ICD-10-CM

## 2019-09-16 DIAGNOSIS — G8929 Other chronic pain: Secondary | ICD-10-CM

## 2019-09-16 DIAGNOSIS — M25512 Pain in left shoulder: Secondary | ICD-10-CM

## 2019-09-16 NOTE — Therapy (Signed)
[facility] [address] [place], [place], [address] Phone: [phone]   Fax:  [phone]  Physical Therapy Treatment   Patient Details  Name: Lori Mcgee MRN: [id number] Date of Birth: 09/06/1986 Referring Provider (PT): Tower, Marne A, MD   Encounter Date: 09/16/2019  PT End of Session - 09/16/19 1758    Visit Number  13    Number of Visits  22    Date for PT Re-Evaluation  10/26/19    Authorization Type   Employee reporting period from 07/21/2019    Authorization Time Period  Current cert period: 07/21/2019 - 10/14/2019    Authorization - Visit Number  3    Authorization - Number of Visits  10    PT Start Time  1745    PT Stop Time  1830    PT Time Calculation (min)  45 min    Activity Tolerance  Patient tolerated treatment well    Behavior During Therapy  WFL for tasks assessed/performed       Past Medical History:  Diagnosis Date  . BV (bacterial vaginosis)     Past Surgical History:  Procedure Laterality Date  . HYSTEROSCOPY N/A 10/31/2016   Procedure: HYSTEROSCOPY WITH IUD REMOVAL;  Surgeon: Anika Cherry, MD;  Location: [facility] ORS;  Service: Gynecology;  Laterality: N/A;  . iud extraction  10/31/2016  . WISDOM TOOTH EXTRACTION      There were no vitals filed for this visit.  Subjective Assessment - 09/16/19 1748    Subjective  Patient reports that her hip went back to her baseline today and last day was her rest day and she didn't do exercises yesterday. She would like to trial running again. Her major concern is her neck pain today rated as 3/10.    Pertinent History  Patient is a 33 y.o. female who presents to outpatient physical therapy with a referral for medical diagnosis acute left-sided low back pain without sciatica. This patient's chief complaints consist of pain in the L glute and lumbar spine causing difficulty with motions involving lumbopelvic region leading to the  following functional deficits: difficulty sitting, bending, lifting, twisting, transferring, prolonged standing, bed mobility. Patient reports sudden onset while stepping backwards 07/18/2019 and no prior history of this type of problem. Relevant past medical history and comorbidities include patient was recently treated for poison ivy that caused a rash over the left flank taking a 15 day taper of prednisone that ended yesterday. History of R hip pain earlier this year that has resolved.    Limitations  Sitting;Other (comment);House hold activities;Lifting    Diagnostic tests  none    Patient Stated Goals  resolve pain and return to prior level of activity    Currently in Pain?  Yes    Pain Score  1     Pain Location  Hip    Pain Orientation  Right    Pain Onset  1 to 4 weeks ago    Pain Score  3    Pain Location  Neck    Pain Orientation  Left    Pain Descriptors / Indicators  Aching    Pain Onset  More than a month ago         OBJECTIVE    REPEATED MOTIONS TESTING: All seated:  Motion/Technique sets x reps During After ROM Functional test** Stoplight Comments  Repeated cervicla retraction x10 End range pain Worse, peripheralized to L ant shold No change    red   Repeated L sidebending 2x10 End range soreness Mildly better, stopped getting better following No change  yellow   Repeated L sidebending with self overpressure x10, 2x20 decreasing Decreased, abolished at R shoulder Improved L sidebending, L rotation, flexion and extension L shoulder: [place]un's negative, neer negative, resisted abduction less painful, speeds I and active speeds decreased pain.  green Stopped after so many reps to prevent excessive cervical spine soreness   TREATMENT:  Therapeutic exercise: to centralize symptoms and improve ROM, strength, muscular endurance, and activity tolerance required for successful completion of functional activities.  - repeated motions testing and treatment (see above). Good response  with L sidebending with self overpressure. Cuing for position, technique, and acceptable and unacceptable response. Updated HEP to include postural instructions and similar exercise. - Hip strengthening: (2x10 reps with blue and green band)  - Hip abduction   - Hip adduction   - Hip extension - Patient unable to proceed with blue band due to pain; and cuing provided to use green band.  - Education provided for updated HEP.   Modality: (unbilled) Dry needling performed to L UT to decrease pain and spasms along patient's cervical region with patient in prone utilizing (1) dry needle(s) .25mm x 60mm. Patient educated about the risks and benefits from therapy and verbally consents to treatment. Dry needling performed to glute med on the R LE to decrease pain and spasms along patient's hip and glute region with patient in sidelying utilizing (1) dry needle(s) .3mm x 100mm. Patient educated about the risks and benefits from therapy and verbally consents to treatment. Dry needling performed by Selmer Rissell, PT, DPT who is certified in this technique.      HOME EXERCISE PROGRAM Cervical spine  Access Code: [email or web address]  URL: [email or web address]  Date: 09/16/2019  Prepared by: Sara Snyder   Exercises Seated Cervical Sidebend with self Overpressure - Repeated Motions - 10-20 reps - 1-2 second hold - 1 every 1-2 hours - 7x weekly Seated Posture with Lumbar Roll    Low back Access Code: [email or web address]  URL: [email or web address]  Date: 09/16/2019  Prepared by: Sara Snyder   Exercises Prone Off Center Lumbar Extension Press Up - 10-15 reps - 1 second hold - 4x daily Standard Plank - 2 reps - 30 seconds hold - 1x daily - 7x weekly Standing Hip Adduction with Anchored Resistance Standing Repeated Hip Abduction with Resistance Standing Repeated Hip Extension with Resistance Piriformis Mobilization with Small Ball Front Squat - 3 sets - 10 reps - 1x daily - 7x  weekly    Patient tolerated treatment well and continues to make progress towards goals. Patient demonstrated significant pain with high resistance band (blue) but able to tolerate reduced (green) weight. She responded well to specific exercise for relevant lateral component with repeated motions testing/treatment at her neck, so her HEP was updated to reflect this. LE HEP was also updated to improve gradual return to PLOF. Patient responded well to dry needling with relief noted at R glutes and good twitch response to L UT. Future session will focus on LE strengthening, education on pain control, as well as stretching. Patient would benefit from continued physical therapy to address remaining impairments and functional limitations to work towards stated goals and return to PLOF or maximal functional independence.    PT Education - 09/16/19 1757    Education Details  Exercise purpose/form. Self management techniques.    Person(s) Educated  Patient    Methods  Explanation;Tactile cues;Demonstration;Verbal cues      Comprehension  Verbalized understanding;Returned demonstration;Verbal cues required;Tactile cues required       PT Short Term Goals - 09/14/19 1933      PT SHORT TERM GOAL #1   Title  Be independent with initial home exercise program for self-management of symptoms.    Baseline  initial HEP provided at IE (07/21/2019); updated HEP (09/14/2019)    Time  2    Period  Weeks    Status  Revised    Target Date  09/28/19        PT Long Term Goals - 09/14/19 1934      PT LONG TERM GOAL #1   Title  Demonstrate improved FOTO score to equal or greater than 87 to demonstrate improvement in overall condition and self-reported functional ability.    Baseline  FOTO = 63 (07/21/2019); FOTO = 83 (08/31/2019)    Time  6    Period  Weeks    Status  Partially Met    Target Date  10/26/19      PT LONG TERM GOAL #2   Title  Have full lumbar AROM with no compensations or increase in pain in  all planes except intermittent end range discomfort to allow patient to complete valued activities with less difficulty.    Baseline  painful, see objective exam (07/21/2019); No pain with lumbar extension (08/31/2019)    Time  6    Period  Weeks    Status  Achieved    Target Date  10/26/19      PT LONG TERM GOAL #3   Title  Bilateral hip extension strength will be equal or greater than 4+/5 and pain free to improve ability to return to ADLs and fitness activities without difficulty.    Baseline  R hip extension initially at 3+/5, worst with knee extended (07/21/2019); 5/5 bilaterally hip extension (08/31/2019)    Time  6    Period  Weeks    Status  Achieved    Target Date  10/26/19      PT LONG TERM GOAL #4   Title  Complete community, work and/or recreational activities without limitation due to current condition.    Baseline  currently unable to run or work out as usual, limiting lifting, etc (07/21/2019); able to workout without pain but haven't trial running yet.(08/31/2019); have been able to return to short and light running but having difficulty managing graded return and lacks stability in improvement and is not yet back to prior 4 days a week unrestricted; also is limited in reaching, turning head, driving, caring for children (09/14/2019);    Time  6    Period  Weeks    Status  Partially Met    Target Date  10/26/19      PT LONG TERM GOAL #5   Title  Be independent with a long-term home exercise program for self-management of symptoms.    Baseline  initial HEP provided at IE (07/21/2019); continue updating 08/31/2019; cervical spine added 09/14/2019);    Time  6    Period  Weeks    Status  Partially Met    Target Date  10/26/19      Additional Long Term Goals   Additional Long Term Goals  Yes      PT LONG TERM GOAL #6   Title  Patient will able to complete graded running program (HEP) for 45 minutes without pain to ensure a safe return to running.    Baseline  Started 10  mins   running program (08/31/2019)    Time  6    Period  Weeks    Status  On-going    Target Date  10/26/19      PT LONG TERM GOAL #7   Title  Reduce L shoulder and neck max pain with functional activities to equal or less than 1/10 to allow patient to complete usual activities including ADLs, IADLs, and social engagement with less difficulty.    Baseline  7-8/10 max pain (09/14/2019)    Time  6    Period  Weeks    Status  New    Target Date  10/26/19      PT LONG TERM GOAL #8   Title  Increase L side cervical rotation ROM to 60 degrees to be able to check blind spots when driving.    Baseline  40 degrees with pain (09/14/2019)    Time  6    Period  Weeks    Status  New    Target Date  10/26/19            Plan - 09/16/19 1759    Clinical Impression Statement  Patient tolerated treatment well and continues to make progress towards goals. Patient demonstrated significant pain with high resistance band (blue) but able to tolerate reduced (green) weight. She responded well to specific exercise for relevant lateral component with repeated motions testing/treatment at her neck, so her HEP was updated to reflect this. LE HEP was also updated to improve gradual return to PLOF. Patient responded well to dry needling with relief noted at R glutes and good twitch response to L UT. Future session will focus on LE strengthening, education on pain control, as well as stretching. Patient would benefit from continued physical therapy to address remaining impairments and functional limitations to work towards stated goals and return to PLOF or maximal functional independence.    Personal Factors and Comorbidities  Age;Education;Behavior Pattern;Social Background;Fitness;Past/Current Experience;Comorbidity 1    Comorbidities  history of R hip pain; chronic L shoulder pain    Examination-Activity Limitations  Bend;Caring for Others;Carry;Lift;Transfers;Squat;Sit;Dressing;Stand;Bed Mobility;Reach  Overhead   running, lifting weights   Examination-Participation Restrictions  Other;Community Activity;Interpersonal Relationship;Cleaning;Driving   work, farm Mudlogger, fitness activities   Stability/Clinical Decision Making  Evolving/Moderate complexity    Rehab Potential  Good    PT Frequency  2x / week    PT Duration  6 weeks    PT Treatment/Interventions  ADLs/Self Care Home Management;Cryotherapy;Electrical Stimulation;Moist Heat;Therapeutic activities;Therapeutic exercise;Balance training;Neuromuscular re-education;Patient/family education;Manual techniques;Dry needling;Passive range of motion;Spinal Manipulations;Joint Manipulations;Traction    PT Next Visit Plan  continue core, glute, and functional progression as tolerated. continue progressing cervical spine/shoulder strengthening and specific exercise as tolerated    PT Home Exercise Plan  Medbridge: Lumbar spine Access Code: [email or web address]; cervical spine Access Code: [email or web address]    Consulted and Agree with Plan of Care  Patient       Patient will benefit from skilled therapeutic intervention in order to improve the following deficits and impairments:  Decreased strength, Impaired flexibility, Increased muscle spasms, Pain, Decreased activity tolerance, Decreased endurance, Decreased mobility, Difficulty walking, Impaired perceived functional ability, Increased fascial restricitons, Decreased range of motion, Impaired UE functional use, Postural dysfunction, Hypermobility, Improper body mechanics  Visit Diagnosis: Cervicalgia  Acute left-sided low back pain without sciatica  Chronic left shoulder pain  Muscle weakness (generalized)  Paresthesia of skin  Pain in right hip     Problem List Patient Active Problem List   Diagnosis Date Noted  .  Acute left-sided low back pain 07/19/2019  . Rash/skin eruption 07/16/2019  . Preventative health care 04/16/2018   Sherrilyn Rist, SPT 09/16/19, 7:43 PM  Everlean Alstrom. Graylon Good, PT,  DPT 09/16/19, 7:44 PM  [place] [facility] [address] [place], [address] Phone: [phone]   Fax:  [phone]  Name: GABI MCFATE MRN: [id number] Date of Birth: [birth year]

## 2019-09-20 ENCOUNTER — Ambulatory Visit: Payer: No Typology Code available for payment source | Admitting: Physical Therapy

## 2019-09-20 ENCOUNTER — Encounter: Payer: Self-pay | Admitting: Physical Therapy

## 2019-09-20 ENCOUNTER — Other Ambulatory Visit: Payer: Self-pay

## 2019-09-20 DIAGNOSIS — M545 Low back pain, unspecified: Secondary | ICD-10-CM

## 2019-09-20 DIAGNOSIS — M542 Cervicalgia: Secondary | ICD-10-CM

## 2019-09-20 DIAGNOSIS — G8929 Other chronic pain: Secondary | ICD-10-CM

## 2019-09-20 DIAGNOSIS — M25551 Pain in right hip: Secondary | ICD-10-CM

## 2019-09-20 DIAGNOSIS — M25512 Pain in left shoulder: Secondary | ICD-10-CM

## 2019-09-20 DIAGNOSIS — R202 Paresthesia of skin: Secondary | ICD-10-CM

## 2019-09-20 NOTE — Therapy (Signed)
[facility] [address], [place], [address] Phone: [phone]   Fax:  [phone]  Physical Therapy Treatment   Patient Details  Name: Lori Mcgee MRN: [id number] Date of Birth: 1986-03-19 Referring Provider (PT): Glori Bickers, Wynelle Fanny, MD   Encounter Date: 09/20/2019  PT End of Session - 09/20/19 1906    Visit Number  14    Number of Visits  22    Date for PT Re-Evaluation  10/26/19    Authorization Type  [employer] reporting period from 07/21/2019    Authorization Time Period  Current cert period: 5/99/7741 - 10/14/2019    Authorization - Visit Number  4    Authorization - Number of Visits  10    PT Start Time  4239    PT Stop Time  1900    PT Time Calculation (min)  50 min    Activity Tolerance  Patient tolerated treatment well    Behavior During Therapy  [facility] for tasks assessed/performed       Past Medical History:  Diagnosis Date  . BV (bacterial vaginosis)     Past Surgical History:  Procedure Laterality Date  . HYSTEROSCOPY N/A 10/31/2016   Procedure: HYSTEROSCOPY WITH IUD REMOVAL;  Surgeon: Rubie Maid, MD;  Location: [facility] ORS;  Service: Gynecology;  Laterality: N/A;  . iud extraction  10/31/2016  . WISDOM TOOTH EXTRACTION      There were no vitals filed for this visit.  Subjective Assessment - 09/20/19 1814    Subjective  Patient reports that her hip stated the same with soreness at R posterior gluteal region and L shoulder has been improved and rated as 1/10 and centralized to mid L neck.    Pertinent History  Patient is a 33 y.o. female who presents to outpatient physical therapy with a referral for medical diagnosis acute left-sided low back pain without sciatica. This patient's chief complaints consist of pain in the L glute and lumbar spine causing difficulty with motions involving lumbopelvic region leading to the following functional deficits: difficulty sitting, bending,  lifting, twisting, transferring, prolonged standing, bed mobility. Patient reports sudden onset while stepping backwards 07/18/2019 and no prior history of this type of problem. Relevant past medical history and comorbidities include patient was recently treated for poison ivy that caused a rash over the left flank taking a 15 day taper of prednisone that ended yesterday. History of R hip pain earlier this year that has resolved.    Limitations  Sitting;Other (comment);House hold activities;Lifting    Diagnostic tests  none    Patient Stated Goals  resolve pain and return to prior level of activity    Currently in Pain?  Yes    Pain Score  1     Pain Location  Hip    Pain Orientation  Right    Pain Descriptors / Indicators  Sore    Pain Onset  1 to 4 weeks ago    Pain Score  1    Pain Location  Shoulder    Pain Orientation  Left    Pain Descriptors / Indicators  Aching    Pain Onset  More than a month ago       TREATMENT:  Therapeutic exercise:to centralize symptoms and improve ROM, strength, muscular endurance, and activity tolerance required for successful completion of functional activities.  - Running on the treadmill for stress testing and muscle endurance to assess pain at R hip 10 mins.  No pain immediate after the running. Tightness developed at R hip at 8 mins.  - Gluteal medius stretching at R and L hip 15s x 3 reps each side.  - Single leg standing from chair 5x5reps at each LE.  - Running 6x20 feet. Cuing to improve gluteal activation to be able to maintain pelvic stability.   - seated repeated side-bending towards R with self OP, 2x10. Reports it feels good but no improvement.  - seated repeated R rotation with self-overpressure 2x10. Reports improvements after each set. Added to HEP and updated.   Modality: (unbilled) Dry needling performed to glute med on the R LE to decrease pain and spasms along patient's hip and glute region with patient in sidelying utilizing (1) dry  needle(s) .5m x 1060m Patient educated about the risks and benefits from therapy and verbally consents to treatment. Dry needling performed by WeBlythe StanfordPT, DPT who is certified in this technique.    HOME EXERCISE PROGRAM Cervical spine  Access Code: [email or web address]  URL: [email or web address]  Date: 09/20/2019  Prepared by: SaRosita Kea Exercises  Seated Cervical Sidebend with self Overpressure - Repeated Motions - 10-20 reps - 1-2 second hold - 1 every 1-2 hours - 7x weekly  Standing Cervical Rotation AROM with Overpressure - repeated motions - 10-15 reps - 1-2 second hold - 1 every 1-2 hours - 7x weekly  Seated Posture with Lumbar Roll   Low back Access Code: [email or web address]       URL: [email or web address]    Date: 09/16/2019  Prepared by: Sa[place]bar Extension Press Up - 10-15 reps - 1 second hold - 4x daily Standard Plank - 2 reps - 30 seconds hold - 1x daily - 7x weekly Standing Hip Adduction with Anchored Resistance Standing Repeated Hip Abduction with Resistance Standing Repeated Hip Extension with Resistance Piriformis Mobilization with Small Ball Front Squat - 3 sets - 10 reps - 1x daily - 7x weekly  Patient tolerated treatment well and continues to make progress towards goals. Patient continues to demonstrate improvements with progressed repeated motions for cervical spine for relevant lateral component. Patient demonstrated great muscle activation pattern but still present with pelvis instability. Patient still present weakness at R gluteal region and unable to achieve single leg standing. Future session will focus on LE strengthening, education on pain control, as well as stretching.Patient would benefit from continued physical therapy to address remaining impairments and functional limitations to work towards stated goals and return to PLOF or maximal functional independence.    PT Education - 09/20/19  1906    Education Details  Exercise purpose/form. Self management techniques.    Person(s) Educated  Patient    Methods  Explanation;Demonstration;Tactile cues;Verbal cues    Comprehension  Verbalized understanding;Returned demonstration;Verbal cues required;Tactile cues required       PT Short Term Goals - 09/14/19 1933      PT SHORT TERM GOAL #1   Title  Be independent with initial home exercise program for self-management of symptoms.    Baseline  initial HEP provided at [facility] (07/21/2019); updated HEP (09/14/2019)    Time  2    Period  Weeks    Status  Revised    Target Date  09/28/19        PT Long Term Goals - 09/14/19 1934      PT LONG TERM GOAL #1   Title  Demonstrate improved FOTO score to equal or greater than 87 to demonstrate  improvement in overall condition and self-reported functional ability.    Baseline  FOTO = 63 (07/21/2019); FOTO = 83 (08/31/2019)    Time  6    Period  Weeks    Status  Partially Met    Target Date  10/26/19      PT LONG TERM GOAL #2   Title  Have full lumbar AROM with no compensations or increase in pain in all planes except intermittent end range discomfort to allow patient to complete valued activities with less difficulty.    Baseline  painful, see objective exam (07/21/2019); No pain with lumbar extension (08/31/2019)    Time  6    Period  Weeks    Status  Achieved    Target Date  10/26/19      PT LONG TERM GOAL #3   Title  Bilateral hip extension strength will be equal or greater than 4+/5 and pain free to improve ability to return to ADLs and fitness activities without difficulty.    Baseline  R hip extension initially at 3+/5, worst with knee extended (07/21/2019); 5/5 bilaterally hip extension (08/31/2019)    Time  6    Period  Weeks    Status  Achieved    Target Date  10/26/19      PT LONG TERM GOAL #4   Title  Complete community, work and/or recreational activities without limitation due to current condition.    Baseline   currently unable to run or work out as usual, limiting lifting, etc (07/21/2019); able to workout without pain but haven't trial running yet.(08/31/2019); have been able to return to short and light running but having difficulty managing graded return and lacks stability in improvement and is not yet back to prior 4 days a week unrestricted; also is limited in reaching, turning head, driving, caring for children (09/14/2019);    Time  6    Period  Weeks    Status  Partially Met    Target Date  10/26/19      PT LONG TERM GOAL #5   Title  Be independent with a long-term home exercise program for self-management of symptoms.    Baseline  initial HEP provided at IE (07/21/2019); continue updating 08/31/2019; cervical spine added 09/14/2019);    Time  6    Period  Weeks    Status  Partially Met    Target Date  10/26/19      Additional Long Term Goals   Additional Long Term Goals  Yes      PT LONG TERM GOAL #6   Title  Patient will able to complete graded running program (HEP) for 45 minutes without pain to ensure a safe return to running.    Baseline  Started 10 mins running program (08/31/2019)    Time  6    Period  Weeks    Status  On-going    Target Date  10/26/19      PT LONG TERM GOAL #7   Title  Reduce L shoulder and neck max pain with functional activities to equal or less than 1/10 to allow patient to complete usual activities including ADLs, IADLs, and social engagement with less difficulty.    Baseline  7-8/10 max pain (09/14/2019)    Time  6    Period  Weeks    Status  New    Target Date  10/26/19      PT LONG TERM GOAL #8   Title  Increase L side cervical rotation  ROM to 60 degrees to be able to check blind spots when driving.    Baseline  40 degrees with pain (09/14/2019)    Time  6    Period  Weeks    Status  New    Target Date  10/26/19            Plan - 09/20/19 1907    Clinical Impression Statement  Patient tolerated treatment well and continues to make  progress towards goals. Patient continues to demonstrate improvements with progressed repeated motions for cervical spine for relevant lateral component. Patient demonstrated great muscle activation pattern but still present with pelvis instability. Patient still present weakness at R gluteal region and unable to achieve single leg standing. Future session will focus on LE strengthening, education on pain control, as well as stretching. Patient would benefit from continued physical therapy to address remaining impairments and functional limitations to work towards stated goals and return to PLOF or maximal functional independence.    Personal Factors and Comorbidities  Age;Education;Behavior Pattern;Social Background;Fitness;Past/Current Experience;Comorbidity 1    Comorbidities  history of R hip pain; chronic L shoulder pain    Examination-Activity Limitations  Bend;Caring for Others;Carry;Lift;Transfers;Squat;Sit;Dressing;Stand;Bed Mobility;Reach Overhead   running, lifting weights   Examination-Participation Restrictions  Other;Community Activity;Interpersonal Relationship;Cleaning;Driving   work, farm Mudlogger, fitness activities   Stability/Clinical Decision Making  Evolving/Moderate complexity    Rehab Potential  Good    PT Frequency  2x / week    PT Duration  6 weeks    PT Treatment/Interventions  ADLs/Self Care Home Management;Cryotherapy;Electrical Stimulation;Moist Heat;Therapeutic activities;Therapeutic exercise;Balance training;Neuromuscular re-education;Patient/family education;Manual techniques;Dry needling;Passive range of motion;Spinal Manipulations;Joint Manipulations;Traction    PT Next Visit Plan  continue core, glute, and functional progression as tolerated. continue progressing cervical spine/shoulder strengthening and specific exercise as tolerated    PT Home Exercise Plan  Medbridge: Lumbar spine Access Code: [email or web address]; cervical spine Access Code: [email or web address]    Consulted and Agree  with Plan of Care  Patient       Patient will benefit from skilled therapeutic intervention in order to improve the following deficits and impairments:  Decreased strength, Impaired flexibility, Increased muscle spasms, Pain, Decreased activity tolerance, Decreased endurance, Decreased mobility, Difficulty walking, Impaired perceived functional ability, Increased fascial restricitons, Decreased range of motion, Impaired UE functional use, Postural dysfunction, Hypermobility, Improper body mechanics  Visit Diagnosis: Cervicalgia  Acute left-sided low back pain without sciatica  Chronic left shoulder pain  Paresthesia of skin  Pain in right hip     Problem List Patient Active Problem List   Diagnosis Date Noted  . Acute left-sided low back pain 07/19/2019  . Rash/skin eruption 07/16/2019  . Preventative health care 04/16/2018    Sherrilyn Rist, SPT 09/20/19, 8:10 PM  Everlean Alstrom. Graylon Good, PT, DPT 09/20/19, 8:11 PM   [facility] [address] [place], [address] Phone: [phone]   Fax:  [phone]  Name: VEVERLY LARIMER MRN: [id number] Date of Birth: [birth year]

## 2019-09-21 ENCOUNTER — Encounter: Payer: No Typology Code available for payment source | Admitting: Physical Therapy

## 2019-09-22 ENCOUNTER — Ambulatory Visit: Payer: No Typology Code available for payment source | Admitting: Physical Therapy

## 2019-09-28 ENCOUNTER — Encounter: Payer: No Typology Code available for payment source | Admitting: Physical Therapy

## 2019-10-05 ENCOUNTER — Encounter: Payer: No Typology Code available for payment source | Admitting: Physical Therapy

## 2019-10-07 ENCOUNTER — Encounter: Payer: No Typology Code available for payment source | Admitting: Physical Therapy

## 2019-10-12 ENCOUNTER — Ambulatory Visit: Payer: No Typology Code available for payment source | Attending: Family Medicine | Admitting: Physical Therapy

## 2019-10-12 DIAGNOSIS — M6281 Muscle weakness (generalized): Secondary | ICD-10-CM | POA: Insufficient documentation

## 2019-10-12 DIAGNOSIS — M542 Cervicalgia: Secondary | ICD-10-CM | POA: Insufficient documentation

## 2019-10-12 DIAGNOSIS — M545 Low back pain: Secondary | ICD-10-CM | POA: Insufficient documentation

## 2019-10-12 DIAGNOSIS — M25512 Pain in left shoulder: Secondary | ICD-10-CM | POA: Insufficient documentation

## 2019-10-12 DIAGNOSIS — M25551 Pain in right hip: Secondary | ICD-10-CM | POA: Insufficient documentation

## 2019-10-12 DIAGNOSIS — R202 Paresthesia of skin: Secondary | ICD-10-CM | POA: Insufficient documentation

## 2019-10-12 DIAGNOSIS — G8929 Other chronic pain: Secondary | ICD-10-CM | POA: Insufficient documentation

## 2019-10-14 ENCOUNTER — Ambulatory Visit: Payer: No Typology Code available for payment source | Admitting: Physical Therapy

## 2019-10-19 ENCOUNTER — Encounter: Payer: Self-pay | Admitting: Physical Therapy

## 2019-10-19 ENCOUNTER — Ambulatory Visit: Payer: No Typology Code available for payment source | Admitting: Physical Therapy

## 2019-10-19 ENCOUNTER — Other Ambulatory Visit: Payer: Self-pay

## 2019-10-19 DIAGNOSIS — M542 Cervicalgia: Secondary | ICD-10-CM

## 2019-10-19 DIAGNOSIS — M6281 Muscle weakness (generalized): Secondary | ICD-10-CM | POA: Diagnosis present

## 2019-10-19 DIAGNOSIS — M25512 Pain in left shoulder: Secondary | ICD-10-CM

## 2019-10-19 DIAGNOSIS — M545 Low back pain, unspecified: Secondary | ICD-10-CM

## 2019-10-19 DIAGNOSIS — M25551 Pain in right hip: Secondary | ICD-10-CM

## 2019-10-19 DIAGNOSIS — G8929 Other chronic pain: Secondary | ICD-10-CM

## 2019-10-19 DIAGNOSIS — R202 Paresthesia of skin: Secondary | ICD-10-CM | POA: Diagnosis present

## 2019-10-19 NOTE — Therapy (Signed)
[facility] [address], [place], [address] Phone: [phone]   Fax:  [phone]  Physical Therapy Treatment  Patient Details  Name: Lori Mcgee MRN: [id number] Date of Birth: 1986-04-11 Referring Provider (PT): Glori Bickers, Wynelle Fanny, MD   Encounter Date: 10/19/2019  PT End of Session - 10/19/19 1750    Visit Number  15    Number of Visits  22    Date for PT Re-Evaluation  10/26/19    Authorization Type  [employer] reporting period from 07/21/2019    Authorization Time Period  Current cert period: 6/96/2952 - 10/14/2019    Authorization - Visit Number  5    Authorization - Number of Visits  10    PT Start Time  8413    PT Stop Time  1825    PT Time Calculation (min)  40 min    Activity Tolerance  Patient tolerated treatment well;Patient limited by pain    Behavior During Therapy  [facility] for tasks assessed/performed       Past Medical History:  Diagnosis Date  . BV (bacterial vaginosis)     Past Surgical History:  Procedure Laterality Date  . HYSTEROSCOPY N/A 10/31/2016   Procedure: HYSTEROSCOPY WITH IUD REMOVAL;  Surgeon: Rubie Maid, MD;  Location: [facility] ORS;  Service: Gynecology;  Laterality: N/A;  . iud extraction  10/31/2016  . WISDOM TOOTH EXTRACTION      There were no vitals filed for this visit.  Subjective Assessment - 10/19/19 1743    Subjective  Patient reports her neck has been feeling better and is not hurting now. She feels that the exercises for the neck are helping a lot. She has been able to return to running as well but her R glute continues to get really tight and after running short or long distances and she waits about 4-6 days between runs to recover. She last ran today 5 miles and has soreness in the R glute and rates it as 2/10 pain. All other exercises are going well including squats and deadlifts. She states that the last needling session really helped her and allowed  her to return to running. She would like to do this today. She has been able to do a lot of hiking over the last few weeks while on vacation.    Pertinent History  Patient is a 33 y.o. female who presents to outpatient physical therapy with a referral for medical diagnosis acute left-sided low back pain without sciatica. This patient's chief complaints consist of pain in the L glute and lumbar spine causing difficulty with motions involving lumbopelvic region leading to the following functional deficits: difficulty sitting, bending, lifting, twisting, transferring, prolonged standing, bed mobility. Patient reports sudden onset while stepping backwards 07/18/2019 and no prior history of this type of problem. Relevant past medical history and comorbidities include patient was recently treated for poison ivy that caused a rash over the left flank taking a 15 day taper of prednisone that ended yesterday. History of R hip pain earlier this year that has resolved.    Limitations  Sitting;Other (comment);House hold activities;Lifting    Diagnostic tests  none    Patient Stated Goals  resolve pain and return to prior level of activity    Currently in Pain?  Yes    Pain Score  2     Pain Onset  1 to 4 weeks ago    Pain Onset  More than a  month ago       TREATMENT:  Modality: (unbilled) Dry needling performed to glute med on the R LE to decrease pain and spasms along patient's hip and glute region with patient in sidelying utilizing (1) dry needle(s) .9m x 1052m Patient educated about the risks and benefits from therapy and verbally consents to treatment. Dry needling performed by WeBlythe StanfordPT, DPT who is certified in this technique.  Therapeutic exercise:to centralize symptoms and improve ROM, strength, muscular endurance, and activity tolerance required for successful completion of functional activities. - single leg sit <> stands from green chair (18 inches) 3x10 each side. - standing clam  shell with green theraband, x10 L weight bearing, x 7 R weight bearing. Limited by concordant "tightening" in R glute that leads to pain. Discontinued.  - prone "froggers" butterfly position glute burner, x14, 2x12, with green theraband around knees. Patient reported feeling intense work in glutes without causing concordant "tightness." Added to HEP.   - standing "hip airplane" (see HEP2go) with unilateral UE support. Attempted x 10 each side. Limited by concordant "tightening" in R glute that leads to pain. Discontinued.  - attempted self R hip IR stretch in sidelying with hip flexed less than 90 degrees to target piriformis, but unable to get meaningful stretch.  - Education on HEP including handout    Manual therapy: to reduce pain and tissue tension, improve range of motion, neuromodulation, in order to promote improved ability to complete functional activities. - sidelying R hip IR stretch with manual overpressure with hip flexed less than 90 degrees to target piriformis. Pt reported feeling very good and targeting her concordant tightness/pain.   HOME EXERCISE PROGRAM Cervical spine Access Code: [email or web address]  URL: [email or web address]  Date: 09/20/2019  Prepared by: SaRosita Kea Exercises  Seated Cervical Sidebend with self Overpressure - Repeated Motions - 10-20 reps - 1-2 second hold - 1 every 1-2 hours - 7x weekly  Standing Cervical Rotation AROM with Overpressure - repeated motions - 10-15 reps - 1-2 second hold - 1 every 1-2 hours - 7x weekly  Seated Posture with Lumbar Roll   Low back Access Code: [email or web address] URL: [email or web address] Date: 09/16/2019  Prepared by: SaFeather Soundumbar Extension Press Up - 10-15 reps - 1 second hold - 4x daily Standard Plank - 2 reps - 30 seconds hold - 1x daily - 7x weekly Standing Hip Adduction with Anchored Resistance Standing Repeated Hip Abduction with  Resistance Standing Repeated Hip Extension with Resistance Piriformis Mobilization with Small Ball Front Squat - 3 sets - 10 reps - 1x daily - 7x weekly     PT Education - 10/19/19 1750    Education Details  Exercise purpose/form. Self management techniques.    Person(s) Educated  Patient    Methods  Explanation;Demonstration;Tactile cues;Verbal cues    Comprehension  Verbalized understanding;Returned demonstration;Verbal cues required;Tactile cues required;Need further instruction       PT Short Term Goals - 09/14/19 1933      PT SHORT TERM GOAL #1   Title  Be independent with initial home exercise program for self-management of symptoms.    Baseline  initial HEP provided at IE (07/21/2019); updated HEP (09/14/2019)    Time  2    Period  Weeks    Status  Revised    Target Date  09/28/19        PT Long Term Goals - 09/14/19 1934      PT LONG TERM GOAL #  1   Title  Demonstrate improved FOTO score to equal or greater than 87 to demonstrate improvement in overall condition and self-reported functional ability.    Baseline  FOTO = 63 (07/21/2019); FOTO = 83 (08/31/2019)    Time  6    Period  Weeks    Status  Partially Met    Target Date  10/26/19      PT LONG TERM GOAL #2   Title  Have full lumbar AROM with no compensations or increase in pain in all planes except intermittent end range discomfort to allow patient to complete valued activities with less difficulty.    Baseline  painful, see objective exam (07/21/2019); No pain with lumbar extension (08/31/2019)    Time  6    Period  Weeks    Status  Achieved    Target Date  10/26/19      PT LONG TERM GOAL #3   Title  Bilateral hip extension strength will be equal or greater than 4+/5 and pain free to improve ability to return to ADLs and fitness activities without difficulty.    Baseline  R hip extension initially at 3+/5, worst with knee extended (07/21/2019); 5/5 bilaterally hip extension (08/31/2019)    Time  6    Period   Weeks    Status  Achieved    Target Date  10/26/19      PT LONG TERM GOAL #4   Title  Complete community, work and/or recreational activities without limitation due to current condition.    Baseline  currently unable to run or work out as usual, limiting lifting, etc (07/21/2019); able to workout without pain but haven't trial running yet.(08/31/2019); have been able to return to short and light running but having difficulty managing graded return and lacks stability in improvement and is not yet back to prior 4 days a week unrestricted; also is limited in reaching, turning head, driving, caring for children (09/14/2019);    Time  6    Period  Weeks    Status  Partially Met    Target Date  10/26/19      PT LONG TERM GOAL #5   Title  Be independent with a long-term home exercise program for self-management of symptoms.    Baseline  initial HEP provided at IE (07/21/2019); continue updating 08/31/2019; cervical spine added 09/14/2019);    Time  6    Period  Weeks    Status  Partially Met    Target Date  10/26/19      Additional Long Term Goals   Additional Long Term Goals  Yes      PT LONG TERM GOAL #6   Title  Patient will able to complete graded running program (HEP) for 45 minutes without pain to ensure a safe return to running.    Baseline  Started 10 mins running program (08/31/2019)    Time  6    Period  Weeks    Status  On-going    Target Date  10/26/19      PT LONG TERM GOAL #7   Title  Reduce L shoulder and neck max pain with functional activities to equal or less than 1/10 to allow patient to complete usual activities including ADLs, IADLs, and social engagement with less difficulty.    Baseline  7-8/10 max pain (09/14/2019)    Time  6    Period  Weeks    Status  New    Target Date  10/26/19  PT LONG TERM GOAL #8   Title  Increase L side cervical rotation ROM to 60 degrees to be able to check blind spots when driving.    Baseline  40 degrees with pain (09/14/2019)     Time  6    Period  Weeks    Status  New    Target Date  10/26/19            Plan - 10/19/19 1948    Clinical Impression Statement  Patient tolerated treatment well and continues to make progress towards goals. She reports improved running tolerance but continues to be limited by "tightness" that will develop into pain in the R glutes with certain activities, such as running and exercises performed today that include rotational movement to end range in the R hip. HEP updated to include well tolerated glute exercises. Patient reported appropriate sorenes and relief with dry needling. Would benefit from further hip strengthening and gait analysis during future session. Patient would benefit from continued physical therapy to address remaining impairments and functional limitations to work towards stated goals and return to PLOF or maximal functional independence.    Personal Factors and Comorbidities  Age;Education;Behavior Pattern;Social Background;Fitness;Past/Current Experience;Comorbidity 1    Comorbidities  history of R hip pain; chronic L shoulder pain    Examination-Activity Limitations  Bend;Caring for Others;Carry;Lift;Transfers;Squat;Sit;Dressing;Stand;Bed Mobility;Reach Overhead   running, lifting weights   Examination-Participation Restrictions  Other;Community Activity;Interpersonal Relationship;Cleaning;Driving   work, farm Mudlogger, fitness activities   Stability/Clinical Decision Making  Evolving/Moderate complexity    Rehab Potential  Good    PT Frequency  2x / week    PT Duration  6 weeks    PT Treatment/Interventions  ADLs/Self Care Home Management;Cryotherapy;Electrical Stimulation;Moist Heat;Therapeutic activities;Therapeutic exercise;Balance training;Neuromuscular re-education;Patient/family education;Manual techniques;Dry needling;Passive range of motion;Spinal Manipulations;Joint Manipulations;Traction    PT Next Visit Plan  continue core, glute, and functional  progression as tolerated. continue progressing cervical spine/shoulder strengthening and specific exercise as tolerated    PT Home Exercise Plan  Medbridge: Lumbar spine Access Code: [email or web address]; cervical spine Access Code: [email or web address]    Consulted and Agree with Plan of Care  Patient       Patient will benefit from skilled therapeutic intervention in order to improve the following deficits and impairments:  Decreased strength, Impaired flexibility, Increased muscle spasms, Pain, Decreased activity tolerance, Decreased endurance, Decreased mobility, Difficulty walking, Impaired perceived functional ability, Increased fascial restricitons, Decreased range of motion, Impaired UE functional use, Postural dysfunction, Hypermobility, Improper body mechanics  Visit Diagnosis: No diagnosis found.     Problem List Patient Active Problem List   Diagnosis Date Noted  . Acute left-sided low back pain 07/19/2019  . Rash/skin eruption 07/16/2019  . Preventative health care 04/16/2018    Everlean Alstrom. Graylon Good, PT, DPT 10/19/19, 7:49 PM  [facility] [address] [place], [address] Phone: [phone]   Fax:  [phone]  Name: Lori Mcgee MRN: [id number] Date of Birth: 1985/12/13

## 2019-10-21 ENCOUNTER — Other Ambulatory Visit: Payer: Self-pay

## 2019-10-21 ENCOUNTER — Ambulatory Visit: Payer: No Typology Code available for payment source | Admitting: Physical Therapy

## 2019-10-21 ENCOUNTER — Encounter: Payer: Self-pay | Admitting: Physical Therapy

## 2019-10-21 DIAGNOSIS — M25512 Pain in left shoulder: Secondary | ICD-10-CM

## 2019-10-21 DIAGNOSIS — R202 Paresthesia of skin: Secondary | ICD-10-CM

## 2019-10-21 DIAGNOSIS — M25551 Pain in right hip: Secondary | ICD-10-CM

## 2019-10-21 DIAGNOSIS — M545 Low back pain, unspecified: Secondary | ICD-10-CM

## 2019-10-21 DIAGNOSIS — M542 Cervicalgia: Secondary | ICD-10-CM

## 2019-10-21 DIAGNOSIS — G8929 Other chronic pain: Secondary | ICD-10-CM

## 2019-10-21 DIAGNOSIS — M6281 Muscle weakness (generalized): Secondary | ICD-10-CM

## 2019-10-21 NOTE — Therapy (Signed)
[facility] [address], [place], [address] Phone: [phone]   Fax:  [phone]  Physical Therapy Treatment  Patient Details  Name: Lori Mcgee MRN: [id number] Date of Birth: Jun 26, 1986 Referring Provider (PT): Glori Bickers, Wynelle Fanny, MD   Encounter Date: 10/21/2019  PT End of Session - 10/21/19 2006    Visit Number  16    Number of Visits  22    Date for PT Re-Evaluation  10/26/19    Authorization Type  [employer] reporting period from 07/21/2019    Authorization Time Period  Current cert period: 3/70/4888 - 10/14/2019    Authorization - Visit Number  6    Authorization - Number of Visits  10    PT Start Time  1750    PT Stop Time  1840    PT Time Calculation (min)  50 min    Activity Tolerance  Patient tolerated treatment well;Patient limited by pain    Behavior During Therapy  [facility] for tasks assessed/performed       Past Medical History:  Diagnosis Date  . BV (bacterial vaginosis)     Past Surgical History:  Procedure Laterality Date  . HYSTEROSCOPY N/A 10/31/2016   Procedure: HYSTEROSCOPY WITH IUD REMOVAL;  Surgeon: Rubie Maid, MD;  Location: [facility] ORS;  Service: Gynecology;  Laterality: N/A;  . iud extraction  10/31/2016  . WISDOM TOOTH EXTRACTION      There were no vitals filed for this visit.  Subjective Assessment - 10/21/19 1752    Subjective  Patient reports she has about 2/10 pain at her R iliac crest today that started last night. She did shoulders and rode bike for exercise today. Did not want to chance running earlier today but wore clothing condusive to gait analysis and feels ready to run on the treadmill today.    Pertinent History  Patient is a 33 y.o. female who presents to outpatient physical therapy with a referral for medical diagnosis acute left-sided low back pain without sciatica. This patient's chief complaints consist of pain in the L glute and lumbar spine  causing difficulty with motions involving lumbopelvic region leading to the following functional deficits: difficulty sitting, bending, lifting, twisting, transferring, prolonged standing, bed mobility. Patient reports sudden onset while stepping backwards 07/18/2019 and no prior history of this type of problem. Relevant past medical history and comorbidities include patient was recently treated for poison ivy that caused a rash over the left flank taking a 15 day taper of prednisone that ended yesterday. History of R hip pain earlier this year that has resolved.    Limitations  Sitting;Other (comment);House hold activities;Lifting    Diagnostic tests  none    Patient Stated Goals  resolve pain and return to prior level of activity    Currently in Pain?  Yes    Pain Score  2     Pain Onset  1 to 4 weeks ago    Pain Onset  More than a month ago      OBJECTIVE      TREATMENT:  Therapeutic exercise:to centralize symptoms and improve ROM, strength, muscular endurance, and activity tolerance required for successful completion of functional activities. - figure 4 stretching prior to jog  - treadmill 4.4 then 5.3 mph jog for 5 min for gait analysis video as well as warm up for rest of session. With review of video with patient and completion of Visual Gait Tool (see above). Only mild  imbalances noted.   Further testing for impairments that may affect running:  - Thomas test: negative bilaterally - knee to wall test for dorsiflexion: R= 3 inches; L = 4 inches ( - long sitting dorsiflexion/great toe extensoin: B = 10 degrees dorsiflexion + 60 degrees great toe extension.  - single leg squat B dipping knee just inside big toe, and dropping contralateral hip.  - supine craig's/femoral anteversion/retroversion test as well as assessment of tibial torsion. Appeared fairly neutral bilaterally.   - hip ER airplanes with rear foot on floor (improved control), 3x10 each side. Focused on moving from  neutral to ER position of standing hip. Cuing to keep standing knee alignment. Also attempted some with foot off ground. Less control on R side.   - hip IR airplanes with rear foot on floor x 10 each side. Produced feeling of stretch on R side > L. Less control on L side   - single leg squat 4x5 each side originally as test progressing to exercise to improve dynamic LE alignment. Suggested as HEP.   Manual therapy: to reduce pain and tissue tension, improve range of motion, neuromodulation, in order to promote improved ability to complete functional activities. - Supine R hip IR sustained mobilization Maitland technique with R LE crossed over L with overpressure from PT at R greater trochanter and R knee to stretch R deep external rotator. 4 sets of 30-60 second holds.   Cuing and education provided throughout session.   HOME EXERCISE PROGRAM Cervical spine Access Code: [email or web address]  URL: [email or web address]  Date: 09/20/2019  Prepared by: Rosita Kea   Exercises  Seated Cervical Sidebend with self Overpressure - Repeated Motions - 10-20 reps - 1-2 second hold - 1 every 1-2 hours - 7x weekly  Standing Cervical Rotation AROM with Overpressure - repeated motions - 10-15 reps - 1-2 second hold - 1 every 1-2 hours - 7x weekly  Seated Posture with Lumbar Roll  Low back Access Code: [email or web address] URL: [email or web address] Date: 09/16/2019  Prepared by: [place] Lumbar Extension Press Up - 10-15 reps - 1 second hold - 4x daily Standard Plank - 2 reps - 30 seconds hold - 1x daily - 7x weekly Standing Hip Adduction with Anchored Resistance Standing Repeated Hip Abduction with Resistance Standing Repeated Hip Extension with Resistance Piriformis Mobilization with Small Ball Front Squat - 3 sets - 10 reps - 1x daily - 7x weekly     PT Education - 10/21/19 2005    Education Details  Exercise purpose/form. Self  management techniques.    Person(s) Educated  Patient    Methods  Explanation;Demonstration;Verbal cues;Tactile cues    Comprehension  Verbalized understanding;Returned demonstration;Verbal cues required;Need further instruction;Tactile cues required       PT Short Term Goals - 09/14/19 1933      PT SHORT TERM GOAL #1   Title  Be independent with initial home exercise program for self-management of symptoms.    Baseline  initial HEP provided at IE (07/21/2019); updated HEP (09/14/2019)    Time  2    Period  Weeks    Status  Revised    Target Date  09/28/19        PT Long Term Goals - 09/14/19 1934      PT LONG TERM GOAL #1   Title  Demonstrate improved FOTO score to equal or greater than 87 to demonstrate improvement in overall condition and self-reported functional ability.    Baseline  FOTO =  63 (07/21/2019); FOTO = 83 (08/31/2019)    Time  6    Period  Weeks    Status  Partially Met    Target Date  10/26/19      PT LONG TERM GOAL #2   Title  Have full lumbar AROM with no compensations or increase in pain in all planes except intermittent end range discomfort to allow patient to complete valued activities with less difficulty.    Baseline  painful, see objective exam (07/21/2019); No pain with lumbar extension (08/31/2019)    Time  6    Period  Weeks    Status  Achieved    Target Date  10/26/19      PT LONG TERM GOAL #3   Title  Bilateral hip extension strength will be equal or greater than 4+/5 and pain free to improve ability to return to ADLs and fitness activities without difficulty.    Baseline  R hip extension initially at 3+/5, worst with knee extended (07/21/2019); 5/5 bilaterally hip extension (08/31/2019)    Time  6    Period  Weeks    Status  Achieved    Target Date  10/26/19      PT LONG TERM GOAL #4   Title  Complete community, work and/or recreational activities without limitation due to current condition.    Baseline  currently unable to run or work out as  usual, limiting lifting, etc (07/21/2019); able to workout without pain but haven't trial running yet.(08/31/2019); have been able to return to short and light running but having difficulty managing graded return and lacks stability in improvement and is not yet back to prior 4 days a week unrestricted; also is limited in reaching, turning head, driving, caring for children (09/14/2019);    Time  6    Period  Weeks    Status  Partially Met    Target Date  10/26/19      PT LONG TERM GOAL #5   Title  Be independent with a long-term home exercise program for self-management of symptoms.    Baseline  initial HEP provided at IE (07/21/2019); continue updating 08/31/2019; cervical spine added 09/14/2019);    Time  6    Period  Weeks    Status  Partially Met    Target Date  10/26/19      Additional Long Term Goals   Additional Long Term Goals  Yes      PT LONG TERM GOAL #6   Title  Patient will able to complete graded running program (HEP) for 45 minutes without pain to ensure a safe return to running.    Baseline  Started 10 mins running program (08/31/2019)    Time  6    Period  Weeks    Status  On-going    Target Date  10/26/19      PT LONG TERM GOAL #7   Title  Reduce L shoulder and neck max pain with functional activities to equal or less than 1/10 to allow patient to complete usual activities including ADLs, IADLs, and social engagement with less difficulty.    Baseline  7-8/10 max pain (09/14/2019)    Time  6    Period  Weeks    Status  New    Target Date  10/26/19      PT LONG TERM GOAL #8   Title  Increase L side cervical rotation ROM to 60 degrees to be able to check blind spots when driving.  Baseline  40 degrees with pain (09/14/2019)    Time  6    Period  Weeks    Status  New    Target Date  10/26/19            Plan - 10/21/19 2005    Clinical Impression Statement  Patient tolerated treatment well and continues to make progress towards goals. Found manual  technique to be very pain relieving and single leg activities to be very challenging. Further testing and gait analyses were conducted to further assess for specific reasons for ongoing R glute pain with running. Gait analysis demonstrated some slight variations in movement that may or may not contribute significantly. Patient lacks dorsiflexion on R compared to L with knees flexed, but has adequate dorsiflexion and great toe extension bilaterally for normal running gait. Continues to report symptoms and demonstrates decreased control in bilateral hip rotation (R only side symptomatic). Balanced exercises that aggravate region with manual techniques for pain/tightness control. Did state she felt it into her back when performing manual technique and this will continue to be monitored. Patient would benefit from continued physical therapy to address remaining impairments and functional limitations to work towards stated goals and return to PLOF or maximal functional independence.    Personal Factors and Comorbidities  Age;Education;Behavior Pattern;Social Background;Fitness;Past/Current Experience;Comorbidity 1    Comorbidities  history of R hip pain; chronic L shoulder pain    Examination-Activity Limitations  Bend;Caring for Others;Carry;Lift;Transfers;Squat;Sit;Dressing;Stand;Bed Mobility;Reach Overhead   running, lifting weights   Examination-Participation Restrictions  Other;Community Activity;Interpersonal Relationship;Cleaning;Driving   work, farm Mudlogger, fitness activities   Stability/Clinical Decision Making  Evolving/Moderate complexity    Rehab Potential  Good    PT Frequency  2x / week    PT Duration  6 weeks    PT Treatment/Interventions  ADLs/Self Care Home Management;Cryotherapy;Electrical Stimulation;Moist Heat;Therapeutic activities;Therapeutic exercise;Balance training;Neuromuscular re-education;Patient/family education;Manual techniques;Dry needling;Passive range of motion;Spinal  Manipulations;Joint Manipulations;Traction    PT Next Visit Plan  continue core, glute, and functional progression as tolerated. continue progressing cervical spine/shoulder strengthening and specific exercise as tolerated    PT Home Exercise Plan  Medbridge: Lumbar spine Access Code: [email or web address]; cervical spine Access Code: [email or web address]    Consulted and Agree with Plan of Care  Patient       Patient will benefit from skilled therapeutic intervention in order to improve the following deficits and impairments:  Decreased strength, Impaired flexibility, Increased muscle spasms, Pain, Decreased activity tolerance, Decreased endurance, Decreased mobility, Difficulty walking, Impaired perceived functional ability, Increased fascial restricitons, Decreased range of motion, Impaired UE functional use, Postural dysfunction, Hypermobility, Improper body mechanics  Visit Diagnosis: Cervicalgia  Acute left-sided low back pain without sciatica  Chronic left shoulder pain  Paresthesia of skin  Pain in right hip  Muscle weakness (generalized)     Problem List Patient Active Problem List   Diagnosis Date Noted  . Acute left-sided low back pain 07/19/2019  . Rash/skin eruption 07/16/2019  . Preventative health care 04/16/2018    Everlean Alstrom. Graylon Good, PT, DPT 10/21/19, 8:08 PM  [place] [address], [place], [address] Phone: [phone]   Fax:  [phone]  Name: Lori Mcgee MRN: [id number] Date of Birth: 16-Dec-1985

## 2019-10-26 ENCOUNTER — Ambulatory Visit: Payer: No Typology Code available for payment source | Admitting: Physical Therapy

## 2019-11-02 ENCOUNTER — Ambulatory Visit: Payer: No Typology Code available for payment source | Admitting: Physical Therapy

## 2019-11-09 ENCOUNTER — Ambulatory Visit: Payer: No Typology Code available for payment source | Attending: Family Medicine | Admitting: Physical Therapy

## 2019-11-09 DIAGNOSIS — G8929 Other chronic pain: Secondary | ICD-10-CM | POA: Insufficient documentation

## 2019-11-09 DIAGNOSIS — R202 Paresthesia of skin: Secondary | ICD-10-CM | POA: Insufficient documentation

## 2019-11-09 DIAGNOSIS — M25551 Pain in right hip: Secondary | ICD-10-CM | POA: Insufficient documentation

## 2019-11-09 DIAGNOSIS — M545 Low back pain: Secondary | ICD-10-CM | POA: Insufficient documentation

## 2019-11-09 DIAGNOSIS — M25512 Pain in left shoulder: Secondary | ICD-10-CM | POA: Insufficient documentation

## 2019-11-09 DIAGNOSIS — M6281 Muscle weakness (generalized): Secondary | ICD-10-CM | POA: Insufficient documentation

## 2019-11-09 DIAGNOSIS — M542 Cervicalgia: Secondary | ICD-10-CM | POA: Insufficient documentation

## 2019-11-18 ENCOUNTER — Encounter: Payer: Self-pay | Admitting: Physical Therapy

## 2019-11-18 ENCOUNTER — Ambulatory Visit: Payer: No Typology Code available for payment source | Admitting: Physical Therapy

## 2019-11-18 ENCOUNTER — Other Ambulatory Visit: Payer: Self-pay

## 2019-11-18 DIAGNOSIS — R202 Paresthesia of skin: Secondary | ICD-10-CM | POA: Diagnosis present

## 2019-11-18 DIAGNOSIS — G8929 Other chronic pain: Secondary | ICD-10-CM | POA: Diagnosis present

## 2019-11-18 DIAGNOSIS — M545 Low back pain, unspecified: Secondary | ICD-10-CM

## 2019-11-18 DIAGNOSIS — M542 Cervicalgia: Secondary | ICD-10-CM | POA: Diagnosis not present

## 2019-11-18 DIAGNOSIS — M25551 Pain in right hip: Secondary | ICD-10-CM | POA: Diagnosis present

## 2019-11-18 DIAGNOSIS — M6281 Muscle weakness (generalized): Secondary | ICD-10-CM

## 2019-11-18 DIAGNOSIS — M25512 Pain in left shoulder: Secondary | ICD-10-CM | POA: Diagnosis present

## 2019-11-18 NOTE — Therapy (Signed)
[facility] [address], [place], [address] Phone: [phone]   Fax:  [phone]  Physical Therapy Treatment / Progress Note / Re-Certification Reporting period: 08/31/2019 - 11/18/2019  Patient Details  Name: Lori Mcgee MRN: [id number] Date of Birth: [birth year] Referring Provider (PT): Glori Bickers, Wynelle Fanny, MD   Encounter Date: 11/18/2019  PT End of Session - 11/18/19 1915    Visit Number  17    Number of Visits  40    Date for PT Re-Evaluation  02/10/20    Authorization Type  [employer] reporting period from 07/21/2019    Authorization - Visit Number  7    Authorization - Number of Visits  10    PT Start Time  4982    PT Stop Time  1120    PT Time Calculation (min)  40 min    Activity Tolerance  Patient tolerated treatment well;Patient limited by pain    Behavior During Therapy  [facility] for tasks assessed/performed       Past Medical History:  Diagnosis Date   BV (bacterial vaginosis)     Past Surgical History:  Procedure Laterality Date   HYSTEROSCOPY N/A 10/31/2016   Procedure: HYSTEROSCOPY WITH IUD REMOVAL;  Surgeon: Rubie Maid, MD;  Location: [facility] ORS;  Service: Gynecology;  Laterality: N/A;   iud extraction  10/31/2016   WISDOM TOOTH EXTRACTION      There were no vitals filed for this visit.  Subjective Assessment - 11/18/19 1048    Subjective  Patient reports she has been doing well overall and has been gradually increasing her running distance. She has been running about 3 times per week, short runs during the week (3 miles or so) and long run on the weekend. She was able to do 6.5 miles recently without a problems so she tried 8 miles in the last few days and it re-triggered her pain significantly. She states she felt the pain come on about 6 miles, but she had 2 more miles to go to get home and the pain got up to 7-8/10 during that time. It is now an ache about 4/10 int her R  glute. She is hoping to get some dry needling today to help bring down the pain. She is working towards a goal of running a 15 mile race next year with her neighbors to celebrate one of them turning 24 years old. Patient reports her back gets sore from time to time but nothing she cannot handle and it doesn't prevent her from walking. She feels like she can control it with her HEP when pain occurs in her back. Her neck has been better most of the time. She had one episode of waking up with it stiff over the holidays, but she was able to recover a lot faster with her neck exercises. She is having some pain currently right underneath the collar bone. L shoulder/neck pain worst 5/10 when she slept wrong, other than that it is about 1-2/10    Pertinent History  Patient is a 34 y.o. female who presents to outpatient physical therapy with a referral for medical diagnosis acute left-sided low back pain without sciatica. This patient's chief complaints consist of pain in the L glute and lumbar spine causing difficulty with motions involving lumbopelvic region leading to the following functional deficits: difficulty sitting, bending, lifting, twisting, transferring, prolonged standing, bed mobility. Patient reports sudden onset while stepping backwards 07/18/2019 and no prior history  of this type of problem. Relevant past medical history and comorbidities include patient was recently treated for poison ivy that caused a rash over the left flank taking a 15 day taper of prednisone that ended yesterday. History of R hip pain earlier this year that has resolved.    Limitations  Sitting;Other (comment);House hold activities;Lifting    Diagnostic tests  none    Patient Stated Goals  resolve pain and return to prior level of activity    Currently in Pain?  Yes    Pain Score  4     Pain Location  Hip    Pain Orientation  Right    Pain Descriptors / Indicators  Aching    Pain Type  Chronic pain    Pain Radiating Towards  R  glute    Pain Onset  More than a month ago    Pain Location  Neck    Pain Orientation  Left    Pain Descriptors / Indicators  Aching    Pain Type  Chronic pain    Pain Radiating Towards  neck and shoulder at L side, just superior to clavicle    Pain Onset  --    Pain Frequency  Intermittent    Effect of Pain on Daily Activities  Driving, looking around, over head reaching, taking care for others(children)        OBJECTIVE FOTO (5 min unbilled time) Score is 73  Posture Forward neck posture and protracted shoulder. Improved from baseline.   Spinal Motion  LUMBAR AROM:  - flexion: 100% no pain - extension: 100% ERP in middle  - Rotation: WNL bilaterally  - Side flexion: WNL bilaterally   CERVICAL AROM (prior to repeated motions) R/L 38 Cervical Flexion* pulling concordant pain 54 Cervical Extension* very mild concordant pain 35*/35 Cervical Lateral Flexion, mild pulling  65/58* Cervical Rotation *Indicates pain  Strength R/L 5/5 bilaterally hip extension no pain.  Repeated Motions - cervical retraction with self OP x10: duriing = end range pain, better following. Repeated 2x20 (improved symptoms and movement following).    TREATMENT:  Modality: (unbilled) Dry needling performed to glute max on the R LE to decrease pain and spasms along patients hip and glute region with patient in sidelying utilizing (1) dry needle(s) .56m x 1073m Patient educated about the risks and benefits from therapy and verbally consents to treatment. Dry needling performed by WeBlythe StanfordPT, DPT who is certified in this technique.  Time to fill out FOTO (5 min unbilled)  Therapeutic exercise: to centralize symptoms and improve ROM, strength, muscular endurance, and activity tolerance required for successful completion of functional activities.  - examination to assess progress (see above) - cervical retraction repeated motions with self OP x 50 total with improved motion and pain  response following.    Manual therapy: to reduce pain and tissue tension, improve range of motion, neuromodulation, in order to promote improved ability to complete functional activities. - Supine R hip IR sustained mobilization Maitland technique with R LE crossed over L with overpressure from PT at R greater trochanter and R knee to stretch R deep external rotator. 2 sets of 60-90 second holds with occasional oscillations.    HOME EXERCISE PROGRAM Cervical spine Access Code: [email or web address]  URL: [email or web address]  Date: 09/20/2019  Prepared by: SaRosita Kea Exercises  Seated Cervical Sidebend with self Overpressure - Repeated Motions - 10-20 reps - 1-2 second hold - 1 every 1-2 hours - 7x weekly  Standing Cervical Rotation  AROM with Overpressure - repeated motions - 10-15 reps - 1-2 second hold - 1 every 1-2 hours - 7x weekly  Seated Posture with Lumbar Roll  Low back Access Code: [email or web address] URL: [email or web address] Date: 09/16/2019  Prepared by: [place] Lumbar Extension Press Up - 10-15 reps - 1 second hold - 4x daily Standard Plank - 2 reps - 30 seconds hold - 1x daily - 7x weekly Standing Hip Adduction with Anchored Resistance Standing Repeated Hip Abduction with Resistance Standing Repeated Hip Extension with Resistance Piriformis Mobilization with Small Ball Front Squat - 3 sets - 10 reps - 1x daily - 7x weekly       PT Education - 11/18/19 1053    Education Details  Exercise purpose/form. Self management techniques. POC. HEP    Person(s) Educated  Patient    Methods  Explanation;Demonstration;Tactile cues;Verbal cues    Comprehension  Verbalized understanding;Returned demonstration;Verbal cues required;Need further instruction;Tactile cues required       PT Short Term Goals - 11/18/19 1916      PT SHORT TERM GOAL #1   Title  Be independent with initial home exercise program for  self-management of symptoms.    Baseline  initial HEP provided at IE (07/21/2019); updated HEP (09/14/2019)    Time  2    Period  Weeks    Status  Achieved    Target Date  09/28/19        PT Long Term Goals - 11/18/19 1916      PT LONG TERM GOAL #1   Title  Demonstrate improved FOTO score to equal or greater than 87 to demonstrate improvement in overall condition and self-reported functional ability.    Baseline  FOTO = 63 (07/21/2019); FOTO = 83 (08/31/2019); FOTO = 72 (11/18/2019);    Time  12    Period  Weeks    Status  Partially Met    Target Date  02/10/20      PT LONG TERM GOAL #2   Title  Have full lumbar AROM with no compensations or increase in pain in all planes except intermittent end range discomfort to allow patient to complete valued activities with less difficulty.    Baseline  painful, see objective exam (07/21/2019); No pain with lumbar extension (08/31/2019)    Time  6    Period  Weeks    Status  Achieved    Target Date  10/26/19      PT LONG TERM GOAL #3   Title  Bilateral hip extension strength will be equal or greater than 4+/5 and pain free to improve ability to return to ADLs and fitness activities without difficulty.    Baseline  R hip extension initially at 3+/5, worst with knee extended (07/21/2019); 5/5 bilaterally hip extension (08/31/2019; 11/18/2019);    Time  6    Period  Weeks    Status  Achieved    Target Date  10/26/19      PT LONG TERM GOAL #4   Title  Complete community, work and/or recreational activities without limitation due to current condition.    Baseline  currently unable to run or work out as usual, limiting lifting, etc (07/21/2019); able to workout without pain but haven't trial running yet.(08/31/2019); have been able to return to short and light running but having difficulty managing graded return and lacks stability in improvement and is not yet back to prior 4 days a week unrestricted; also is limited in reaching, turning  head,  driving, caring for children (09/14/2019); has returned to running but is limited by how many miles she can run including recent flair up, has improved ability to use L arm and move head but still getting pain with end range head turning (11/18/2019);    Time  12    Period  Weeks    Status  Partially Met    Target Date  02/10/20      PT LONG TERM GOAL #5   Title  Be independent with a long-term home exercise program for self-management of symptoms.    Baseline  initial HEP provided at IE (07/21/2019); continue updating 08/31/2019; cervical spine added 09/14/2019); continues to participate appropriately but has not yet reached discharge (11/18/2019);    Time  12    Period  Weeks    Status  Partially Met    Target Date  02/10/20      PT LONG TERM GOAL #6   Title  Patient will able to complete graded running program (HEP) for 45 minutes without pain to ensure a safe return to running.    Baseline  Started 10 mins running program (08/31/2019); Reports being able to run up to 6.5 miles comfortably until last saturday when she had lasting pain exacerbation of 8/10 that is down to 4/10 following 8 mile run (11/18/2019);    Time  12    Period  Weeks    Status  Partially Met    Target Date  02/10/20      PT LONG TERM GOAL #7   Title  Reduce L shoulder and neck max pain with functional activities to equal or less than 1/10 to allow patient to complete usual activities including ADLs, IADLs, and social engagement with less difficulty.    Baseline  7-8/10 max pain (09/14/2019); max pain in last 2 weeks 5/10 but only one episode, otherwise up to 1-2/10 (11/18/2019);    Time  12    Period  Weeks    Status  Partially Met    Target Date  02/10/20      PT LONG TERM GOAL #8   Title  Increase L side cervical rotation ROM to 60 degrees to be able to check blind spots when driving.    Baseline  40 degrees with pain (09/14/2019); 58 with pain (11/18/2019);    Time  12    Period  Weeks    Status  Partially Met     Target Date  02/10/20            Plan - 11/18/19 1926    Clinical Impression Statement  Patient has attended 17 physical therapy sessions and continues to make good progress towards goals. She has recently reduced her frequency of visits due to reports of good control of symptoms with exercises at home and graded return to running program that was going well until this weekend when she had a significant exacerbation of symptoms in the right glute region with an 8 mile run. Treated with dry needling and manual stretching today. Objective examination demonstrates continued improvement in low back pain with lumbar AROM as well as continued pain free and strong hip extension MMT. Also demonstrates improved cervical spine AROM, but did continue to have some pain at end range, improved with repeated cervical retraction. Patient continues to have flair-ups as she returns to running and her goal of completing a 50 mile race. She continues to demonstrate significant impairment such as pain, joint/muscle stiffness, decreased activity tolerance, muscle spasm,  and decreased motor control that contribute to functional limitations such as use of L UE, running, turning head during driving, reaching, carrying, and negatively impacting quality of life. Patient would benefit from continued management of limiting condition by skilled physical therapist to address remaining impairments and functional limitations to work towards stated goals and return to PLOF or maximal functional independence. Recommend attending physical therapy up to 2 times a week for 12 more weeks to allow patient to continue progressing towards her goals and achieve PLOF.    Personal Factors and Comorbidities  Age;Education;Behavior Pattern;Social Background;Fitness;Past/Current Experience;Comorbidity 1    Comorbidities  history of R hip pain; chronic L shoulder pain    Examination-Activity Limitations  Bend;Caring for  Others;Carry;Lift;Transfers;Squat;Sit;Dressing;Stand;Bed Mobility;Reach Overhead   running, lifting weights   Examination-Participation Restrictions  Other;Community Activity;Interpersonal Relationship;Cleaning;Driving   work, farm Mudlogger, fitness activities   Stability/Clinical Decision Making  Evolving/Moderate complexity    Rehab Potential  Good    PT Frequency  Other (comment)   up to 2 times per week as needed   PT Duration  12 weeks    PT Treatment/Interventions  ADLs/Self Care Home Management;Cryotherapy;Electrical Stimulation;Moist Heat;Therapeutic activities;Therapeutic exercise;Balance training;Neuromuscular re-education;Patient/family education;Manual techniques;Dry needling;Passive range of motion;Spinal Manipulations;Joint Manipulations;Traction    PT Next Visit Plan  continue core, glute, and functional progression as tolerated. continue progressing cervical spine/shoulder strengthening and specific exercise as tolerated    PT Home Exercise Plan  Medbridge: Lumbar spine Access Code: [email or web address]; cervical spine Access Code: [email or web address]    Consulted and Agree with Plan of Care  Patient       Patient will benefit from skilled therapeutic intervention in order to improve the following deficits and impairments:  Decreased strength, Impaired flexibility, Increased muscle spasms, Pain, Decreased activity tolerance, Decreased endurance, Decreased mobility, Difficulty walking, Impaired perceived functional ability, Increased fascial restricitons, Decreased range of motion, Impaired UE functional use, Postural dysfunction, Hypermobility, Improper body mechanics  Visit Diagnosis: Cervicalgia  Acute left-sided low back pain without sciatica  Chronic left shoulder pain  Paresthesia of skin  Pain in right hip  Muscle weakness (generalized)     Problem List Patient Active Problem List   Diagnosis Date Noted   Acute left-sided low back pain 07/19/2019   Rash/skin eruption  07/16/2019   Preventative health care 04/16/2018   Everlean Alstrom. Graylon Good, PT, DPT 11/18/19, 7:28 PM  [facility] [address] [place], [address] Phone: [phone]   Fax:  [phone]  Name: Lori Mcgee MRN: [id number] Date of Birth: 31-May-1986

## 2019-11-29 ENCOUNTER — Other Ambulatory Visit: Payer: Self-pay

## 2019-11-29 ENCOUNTER — Ambulatory Visit: Payer: No Typology Code available for payment source | Admitting: Physical Therapy

## 2019-11-29 DIAGNOSIS — M6281 Muscle weakness (generalized): Secondary | ICD-10-CM

## 2019-11-29 DIAGNOSIS — M545 Low back pain, unspecified: Secondary | ICD-10-CM

## 2019-11-29 DIAGNOSIS — G8929 Other chronic pain: Secondary | ICD-10-CM

## 2019-11-29 DIAGNOSIS — M25551 Pain in right hip: Secondary | ICD-10-CM

## 2019-11-29 DIAGNOSIS — M542 Cervicalgia: Secondary | ICD-10-CM

## 2019-11-29 DIAGNOSIS — M25512 Pain in left shoulder: Secondary | ICD-10-CM

## 2019-11-29 DIAGNOSIS — R202 Paresthesia of skin: Secondary | ICD-10-CM

## 2019-11-29 NOTE — Therapy (Signed)
[facility] [address] [place], [address] Phone: [phone]   Fax:  [phone]  Physical Therapy Treatment  Patient Details  Name: Lori Mcgee MRN: [id number] Date of Birth: 1986-10-16 Referring Provider (PT): Glori Bickers, Wynelle Fanny, MD   Encounter Date: 11/29/2019  PT End of Session - 11/29/19 2023    Visit Number  18    Number of Visits  40    Date for PT Re-Evaluation  02/10/20    Authorization Type  [employer] reporting period from 07/21/2019    Authorization - Visit Number  8    Authorization - Number of Visits  10    PT Start Time  1820    PT Stop Time  1920    PT Time Calculation (min)  60 min    Activity Tolerance  Patient tolerated treatment well;Patient limited by pain    Behavior During Therapy  [facility] for tasks assessed/performed       Past Medical History:  Diagnosis Date  . BV (bacterial vaginosis)     Past Surgical History:  Procedure Laterality Date  . HYSTEROSCOPY N/A 10/31/2016   Procedure: HYSTEROSCOPY WITH IUD REMOVAL;  Surgeon: Rubie Maid, MD;  Location: [facility] ORS;  Service: Gynecology;  Laterality: N/A;  . iud extraction  10/31/2016  . WISDOM TOOTH EXTRACTION      There were no vitals filed for this visit.  Subjective Assessment - 11/29/19 1824    Subjective  Patient reports she had suddend point pain at her right medial scapular region on Saturday. The only thing different she did was wrestling with the kids who 40-60#. Right before she came to the clinic she had sudden anterior L shoulder pain more lateral than usual when doing overhead press and her grip strength seemed to be decreasing. Last night when she was last night laying on couch her L arm was going numb a lot and it would do it again after she repositioned it (after slight repreive). She has also had a feeeling like her 4th and 5th digist on that side were swollen like tree truncks. Low back has been hurting  R > L that started today. She has also not been running because of R glute region. She got a bit of releif from last session there but has ran 3 miles since then (did not increase pain). Hip pain is dull/achy (2/10). Low back 4/10, scapular region 5/10, R shoulder 6-7/10 when lifting, 2/10 when sitting. Reports she fears cancer because of the very specific and sharp nature of scapular region pain. It is more dull and diffuse now. Reports it almost feels like the pain in her neck and low back are connected.    Pertinent History  Patient is a 34 y.o. female who presents to outpatient physical therapy with a referral for medical diagnosis acute left-sided low back pain without sciatica. This patient's chief complaints consist of pain in the L glute and lumbar spine causing difficulty with motions involving lumbopelvic region leading to the following functional deficits: difficulty sitting, bending, lifting, twisting, transferring, prolonged standing, bed mobility. Patient reports sudden onset while stepping backwards 07/18/2019 and no prior history of this type of problem. Relevant past medical history and comorbidities include patient was recently treated for poison ivy that caused a rash over the left flank taking a 15 day taper of prednisone that ended yesterday. History of R hip pain earlier this year that has resolved.    Limitations  Sitting;Other (comment);House hold activities;Lifting    Diagnostic tests  none    Patient Stated Goals  resolve pain and return to prior level of activity    Currently in Pain?  Yes    Pain Score  6     Pain Onset  More than a month ago          OBJECTIVE  OBSERVATION/INSPECTION . Posture: erect posture with hyperkyphosis at CT junction.   . Muscle bulk: well formed musculature as expected with regular physical activity.   NEUROLOGICAL  Dermatomes . C2-T1 appears equal and intact to light touch.   Neurodynamic testing: R = positive for median and ulnar  nerves; L  = positive for median and radial nerves.   SPINE MOTION  Cervical Spine AROM *Indicates pain Flexion: = 50 pain at R neck and shoulder Extension: = 55 pain in R scapula, L shoulder Rotation: R= 60, L = 62 (feels tight). (OP caused ipsilateral pain bilaterally) Side Flexion: R= 35 (OP causes L pain)  L =35 left ant shoulder pain. (OP caused R pain)  PERIPHERAL JOINT MOTION (in degrees) B shoulder and elbow AROM WFL except pain in certain positions (as in special tests and neurodynamic tests). Overhead movement L shoulder uncomfortable. "feels weak"    MUSCLE PERFORMANCE (MMT):  B elbow flexion 5/5  L shoulder flexion 4+/5 compared to left "feels weak" to patient.  Other motion testing deferred to later date.   REPEATED MOTIONS TESTING:  Motion/Technique sets x reps During After ROM Functional test** [place] Comments  Cervical retraction  With overpressure against wall x15 x15 End range L shoulder/neck pain No worse      Supine C retraction with clinician OP 2x6 End range pain at B neck/shoulders "sore"      Supine C retraction to extension with distraction as tolerated x4 End range pain at B neck/shoulders No worse      Repeated C side bending to left with and without self or clinician OP in loaded and unloaded x5 each  Increasing concordant pain at R No worse    Discontinued due to worsening R pain.   Prone press up (lumbar extension) x10 Improving lumbar pain better        SPECIAL TESTS: Cervical: Cervical axial compression and distraction = negative   Neurodynamic testing: R = positive for median and ulnar nerves; L  = positive for median and radial nerves.   L Shoulder Positive: painful arc, hawkins-kennedy in flexion Negative: empty can, neer's, speed's (I, II active), hawkin's kennedy in scaption (mild discomfort), 90/90 lag test   ACCESSORY MOTION:  - CPA at cervical spine painful to upper to mid levels - R UPA at T3 reproduces R scapular pain - L UPA at  CT junction reproduces L shoulder pain - L first rib caudal mobilization reproduces L shoulder pain  PALPATION: - TTP with reproduction of L scapular symptoms at L paraspinals just medial to scapular border. Spasms and tense muscle tissue pressent.  - TTP with reproduction of R shoulder symptoms at posterior triangle of neck - TTP with spasm and trigger points noted at B suboccipitals and along cervical spine paraspinals.  - Did report pain down to R lumbar region with painful palpation of R neck and thoracic spine.    TREATMENT:  Therapeutic exercise: to centralize symptoms and improve ROM, strength, muscular endurance, and activity tolerance required for successful completion of functional activities.  - examination to assess source and response to intervention for new/worse symptoms (see  above).  - Cervical retraction x10 - Cervical retraction with overpressure against wall x10 - Seated rpeated cervical spine side bending to left with self OP x5 - prone press up x 10 (decreased lumbar spine pain). Cuing to move without hips off center. - Median Nerve Flossing  15 reps each side - Radial Nerve Flossing 15 reps L side - Ulnar Nerve Flossing 15 reps R side - Education on HEP including handout    Manual therapy: to reduce pain and tissue tension, improve range of motion, neuromodulation, in order to promote improved ability to complete functional activities. - manual examination to assess for source of symptoms (see above) - Supine cervical retraction with clinician OP 2x6 - Supine cervical retraction to extension with distraction as tolerated x4 - supine repeated cervical spine side bending to left with clinician OP x5 - seated L 1st rib mobilization x 3 (discontinued due to increased L shoulder pain) - supine L 1st rib caudal mobilization x 6, discontinued due to pain.  - supine PROM R and L sidebending stretch to cervical spine x 30 seconds each. Painful at left shoulder when stretched   towards R.  - supine segmental cervical spine upglides each direction x3-5 at each level grade III-IV.  - STM with trigger point release to posterior cervical paraspinals, R > L, upper traps, levator scap, R thoracic paraspinals, middle trap, and rhomboids.  - prone CPA with and without KE wedge through T and C spine grade III-IV.  - prone R scapular mobilization in all directions including distraction to decrease tension and pain in periscapular region.   HOME EXERCISE PROGRAM Access Code: [email or web address]  URL: [email or web address]  Date: 11/29/2019  Prepared by: Rosita Kea   Exercises Median Nerve Flossing - Tray - 1 sets - 15 reps - 3x daily Radial Nerve Flossing - 1 sets - 15 reps - 3x daily Ulnar Nerve Flossing - 1 sets - 15 reps - 3x daily   PT Education - 11/29/19 2024    Education Details  Exercise purpose/form. Self management techniques. Education on diagnosis, prognosis, POC, anatomy and physiology of current condition Education on HEP including handout    Person(s) Educated  Patient    Methods  Explanation;Demonstration;Tactile cues;Verbal cues;Handout    Comprehension  Verbalized understanding;Returned demonstration;Verbal cues required;Need further instruction;Tactile cues required       PT Short Term Goals - 11/18/19 1916      PT SHORT TERM GOAL #1   Title  Be independent with initial home exercise program for self-management of symptoms.    Baseline  initial HEP provided at IE (07/21/2019); updated HEP (09/14/2019)    Time  2    Period  Weeks    Status  Achieved    Target Date  09/28/19        PT Long Term Goals - 11/18/19 1916      PT LONG TERM GOAL #1   Title  Demonstrate improved FOTO score to equal or greater than 87 to demonstrate improvement in overall condition and self-reported functional ability.    Baseline  FOTO = 63 (07/21/2019); FOTO = 83 (08/31/2019); FOTO = 72 (11/18/2019);    Time  12    Period  Weeks    Status  Partially Met     Target Date  02/10/20      PT LONG TERM GOAL #2   Title  Have full lumbar AROM with no compensations or increase in pain in all planes except intermittent end range discomfort to allow  patient to complete valued activities with less difficulty.    Baseline  painful, see objective exam (07/21/2019); No pain with lumbar extension (08/31/2019)    Time  6    Period  Weeks    Status  Achieved    Target Date  10/26/19      PT LONG TERM GOAL #3   Title  Bilateral hip extension strength will be equal or greater than 4+/5 and pain free to improve ability to return to ADLs and fitness activities without difficulty.    Baseline  R hip extension initially at 3+/5, worst with knee extended (07/21/2019); 5/5 bilaterally hip extension (08/31/2019; 11/18/2019);    Time  6    Period  Weeks    Status  Achieved    Target Date  10/26/19      PT LONG TERM GOAL #4   Title  Complete community, work and/or recreational activities without limitation due to current condition.    Baseline  currently unable to run or work out as usual, limiting lifting, etc (07/21/2019); able to workout without pain but haven't trial running yet.(08/31/2019); have been able to return to short and light running but having difficulty managing graded return and lacks stability in improvement and is not yet back to prior 4 days a week unrestricted; also is limited in reaching, turning head, driving, caring for children (09/14/2019); has returned to running but is limited by how many miles she can run including recent flair up, has improved ability to use L arm and move head but still getting pain with end range head turning (11/18/2019);    Time  12    Period  Weeks    Status  Partially Met    Target Date  02/10/20      PT LONG TERM GOAL #5   Title  Be independent with a long-term home exercise program for self-management of symptoms.    Baseline  initial HEP provided at IE (07/21/2019); continue updating 08/31/2019; cervical spine added  09/14/2019); continues to participate appropriately but has not yet reached discharge (11/18/2019);    Time  12    Period  Weeks    Status  Partially Met    Target Date  02/10/20      PT LONG TERM GOAL #6   Title  Patient will able to complete graded running program (HEP) for 45 minutes without pain to ensure a safe return to running.    Baseline  Started 10 mins running program (08/31/2019); Reports being able to run up to 6.5 miles comfortably until last saturday when she had lasting pain exacerbation of 8/10 that is down to 4/10 following 8 mile run (11/18/2019);    Time  12    Period  Weeks    Status  Partially Met    Target Date  02/10/20      PT LONG TERM GOAL #7   Title  Reduce L shoulder and neck max pain with functional activities to equal or less than 1/10 to allow patient to complete usual activities including ADLs, IADLs, and social engagement with less difficulty.    Baseline  7-8/10 max pain (09/14/2019); max pain in last 2 weeks 5/10 but only one episode, otherwise up to 1-2/10 (11/18/2019);    Time  12    Period  Weeks    Status  Partially Met    Target Date  02/10/20      PT LONG TERM GOAL #8   Title  Increase L side cervical rotation  ROM to 60 degrees to be able to check blind spots when driving.    Baseline  40 degrees with pain (09/14/2019); 58 with pain (11/18/2019);    Time  12    Period  Weeks    Status  Partially Met    Target Date  02/10/20            Plan - 11/29/19 2022    Clinical Impression Statement  Patient tolerated treatment well overall but did not get lasting relief from pian or symptoms with any technique. Despite some positive tests for the L shoulder, symptoms appear to be coming from the cervical spine and/or upper thoracic spine as she has several concordant symptoms with neurodynamic tests as well as mobilization or ROM of cervical spine. Does appear to have irritation with L 1st rib mobility as well. May further test for TOS next session.  Provided nerve glides for pain relief due to multiple neurodynamic tests positive. Patient appears to have experienced a worsening of her neck symptoms during her normal daily activities that is now restricting her in function and would benefit from further attention to this region. Her back pain was decreased with lumbar extension. R glute continues to hurt but was not directly addressed today. Patient would benefit from continued management of limiting condition by skilled physical therapist to address remaining impairments and functional limitations to work towards stated goals and return to PLOF or maximal functional independence.    Personal Factors and Comorbidities  Age;Education;Behavior Pattern;Social Background;Fitness;Past/Current Experience;Comorbidity 1    Comorbidities  history of R hip pain; chronic L shoulder pain    Examination-Activity Limitations  Bend;Caring for Others;Carry;Lift;Transfers;Squat;Sit;Dressing;Stand;Bed Mobility;Reach Overhead   running, lifting weights   Examination-Participation Restrictions  Other;Community Activity;Interpersonal Relationship;Cleaning;Driving   work, farm Mudlogger, fitness activities   Stability/Clinical Decision Making  Evolving/Moderate complexity    Rehab Potential  Good    PT Frequency  Other (comment)   up to 2 times per week as needed   PT Duration  12 weeks    PT Treatment/Interventions  ADLs/Self Care Home Management;Cryotherapy;Electrical Stimulation;Moist Heat;Therapeutic activities;Therapeutic exercise;Balance training;Neuromuscular re-education;Patient/family education;Manual techniques;Dry needling;Passive range of motion;Spinal Manipulations;Joint Manipulations;Traction    PT Next Visit Plan  continue core, glute, and functional progression as tolerated. continue progressing cervical spine/shoulder strengthening and specific exercise as tolerated    PT Home Exercise Plan  Medbridge: Lumbar spine Access Code: [email or web address]; cervical spine  Access Code: [email or web address]; nerve floss Access Code: [email or web address]    Consulted and Agree with Plan of Care  Patient       Patient will benefit from skilled therapeutic intervention in order to improve the following deficits and impairments:  Decreased strength, Impaired flexibility, Increased muscle spasms, Pain, Decreased activity tolerance, Decreased endurance, Decreased mobility, Difficulty walking, Impaired perceived functional ability, Increased fascial restricitons, Decreased range of motion, Impaired UE functional use, Postural dysfunction, Hypermobility, Improper body mechanics  Visit Diagnosis: Cervicalgia  Acute left-sided low back pain without sciatica  Chronic left shoulder pain  Paresthesia of skin  Pain in right hip  Muscle weakness (generalized)     Problem List Patient Active Problem List   Diagnosis Date Noted  . Acute left-sided low back pain 07/19/2019  . Rash/skin eruption 07/16/2019  . Preventative health care 04/16/2018    Everlean Alstrom. Graylon Good, PT, DPT 11/29/19, 8:27 PM  [facility] [address] [place], [address] Phone: [phone]   Fax:  [phone]  Name: Lori Mcgee MRN:  [id number] Date of Birth: 1986/09/01

## 2019-12-02 ENCOUNTER — Other Ambulatory Visit: Payer: Self-pay

## 2019-12-02 ENCOUNTER — Ambulatory Visit: Payer: No Typology Code available for payment source | Admitting: Physical Therapy

## 2019-12-02 DIAGNOSIS — M542 Cervicalgia: Secondary | ICD-10-CM | POA: Diagnosis not present

## 2019-12-02 DIAGNOSIS — M545 Low back pain, unspecified: Secondary | ICD-10-CM

## 2019-12-02 DIAGNOSIS — M25551 Pain in right hip: Secondary | ICD-10-CM

## 2019-12-02 DIAGNOSIS — M6281 Muscle weakness (generalized): Secondary | ICD-10-CM

## 2019-12-02 DIAGNOSIS — M25512 Pain in left shoulder: Secondary | ICD-10-CM

## 2019-12-02 DIAGNOSIS — G8929 Other chronic pain: Secondary | ICD-10-CM

## 2019-12-02 DIAGNOSIS — R202 Paresthesia of skin: Secondary | ICD-10-CM

## 2019-12-02 NOTE — Therapy (Signed)
[facility] [address] [place], [address] Phone: [phone]   Fax:  [phone]  Physical Therapy Treatment  Patient Details  Name: HENRIETTE HESSER MRN: [id number] Date of Birth: 1985-11-20 Referring Provider (PT): Tower, Wynelle Fanny, MD   Encounter Date: 12/02/2019  PT End of Session - 12/02/19 1938    Visit Number  19    Number of Visits  40    Date for PT Re-Evaluation  02/10/20    Authorization Type  [employer] reporting period from 07/21/2019    Authorization - Visit Number  9    Authorization - Number of Visits  10    PT Start Time  6283    PT Stop Time  1819    PT Time Calculation (min)  44 min    Activity Tolerance  Patient tolerated treatment well;Patient limited by pain    Behavior During Therapy   [facility] tasks assessed/performed       Past Medical History:  Diagnosis Date  . BV (bacterial vaginosis)     Past Surgical History:  Procedure Laterality Date  . HYSTEROSCOPY N/A 10/31/2016   Procedure: HYSTEROSCOPY WITH IUD REMOVAL;  Surgeon: Rubie Maid, MD;  Location: [facility] ORS;  Service: Gynecology;  Laterality: N/A;  . iud extraction  10/31/2016  . WISDOM TOOTH EXTRACTION      There were no vitals filed for this visit.  Subjective Assessment - 12/02/19 1737    Subjective  Patient reports her neck is feeling better but she couldn't work out so she went on a run for 4 miles tuesday and it felt good during the run but the next day her right glute tighened up again. She noticed that when she was tired running her arms drooped more and she felt pulling in the back of the neck. Her arm no longer feels weak but she gets a sharp pulling on the right occasionally on the right and the pain is more centralized near the base of the neck. She tried to do overhead press (squat thrusters) and she stopped at 7 reps instead of 10. She felt the pain and the pain did not continue. She no longer has a  headache. She has been doing the median nerve glide on both sides and it seemed to really help the left side. Neck pain number is 2-3/10 and more sharp for a second.    Pertinent History  Patient is a 34 y.o. female who presents to outpatient physical therapy with a referral for medical diagnosis acute left-sided low back pain without sciatica. This patient's chief complaints consist of pain in the L glute and lumbar spine causing difficulty with motions involving lumbopelvic region leading to the following functional deficits: difficulty sitting, bending, lifting, twisting, transferring, prolonged standing, bed mobility. Patient reports sudden onset while stepping backwards 07/18/2019 and no prior history of this type of problem. Relevant past medical history and comorbidities include patient was recently treated for poison ivy that caused a rash over the left flank taking a 15 day taper of prednisone that ended yesterday. History of R hip pain earlier this year that has resolved.    Limitations  Sitting;Other (comment);House hold activities;Lifting    Diagnostic tests  none    Patient Stated Goals  resolve pain and return to prior level of activity    Currently in Pain?  Yes    Pain Score  3     Pain Onset  More than a  month ago       TREATMENT:    Manual therapy: to reduce pain and tissue tension, improve range of motion, neuromodulation, in order to promote improved ability to complete functional activities. - Supine R hip IR sustained mobilization Maitland technique with R LE crossed over L with overpressure from PT at R greater trochanter and R knee to stretch R deep external rotator. 3 sets of 60-90 second holds with occasional oscillations.  - supine R hip joint mobilizations with mob belt, grade III-IV in caudal, lateral, and posterior glide with  Hip in flexion and crossed over left leg. Approximately 60 seconds each direction.  - supine STM with trigger point release to posterior cervical  paraspinals, R > L, upper traps, levator scap.  - seated  - seated first rib MET with manual pressure anterior and inferior on L first rib just anterior to R upper trap through several breathing cycles. - Supine cervical retraction with clinician OP x6, no pain - Supine cervical retraction to extension with distraction as tolerated x2, end range pain but able to go farther than at last session.   Modality: (unbilled) Dry needling performed to glute max on the R LE to decrease pain and spasms along patient's hip and glute region with patient in sidelying utilizing (1) dry needle(s) .12m x 1042m Patient educated about the risks and benefits from therapy and verbally consents to treatment. Patient in prone position. Dry needling performed by WeBlythe StanfordPT, DPT who is certified in this technique.  HOME EXERCISE PROGRAM Access Code: [email or web address]      URL: [email or web address]    Date: 11/29/2019  Prepared by: SaRosita Kea Exercises Median Nerve Flossing - Tray - 1 sets - 15 reps - 3x daily Radial Nerve Flossing - 1 sets - 15 reps - 3x daily Ulnar Nerve Flossing - 1 sets - 15 reps - 3x daily    PT Education - 12/02/19 1937    Education Details  intervention purpose/form. Self management techniques.    Person(s) Educated  Patient    Methods  Explanation;Demonstration;Tactile cues;Verbal cues    Comprehension  Verbalized understanding;Returned demonstration;Verbal cues required;Tactile cues required       PT Short Term Goals - 11/18/19 1916      PT SHORT TERM GOAL #1   Title  Be independent with initial home exercise program for self-management of symptoms.    Baseline  initial HEP provided at IE (07/21/2019); updated HEP (09/14/2019)    Time  2    Period  Weeks    Status  Achieved    Target Date  09/28/19        PT Long Term Goals - 11/18/19 1916      PT LONG TERM GOAL #1   Title  Demonstrate improved FOTO score to equal or greater than 87 to demonstrate  improvement in overall condition and self-reported functional ability.    Baseline  FOTO = 63 (07/21/2019); FOTO = 83 (08/31/2019); FOTO = 72 (11/18/2019);    Time  12    Period  Weeks    Status  Partially Met    Target Date  02/10/20      PT LONG TERM GOAL #2   Title  Have full lumbar AROM with no compensations or increase in pain in all planes except intermittent end range discomfort to allow patient to complete valued activities with less difficulty.    Baseline  painful, see objective exam (07/21/2019); No pain with lumbar extension (08/31/2019)  Time  6    Period  Weeks    Status  Achieved    Target Date  10/26/19      PT LONG TERM GOAL #3   Title  Bilateral hip extension strength will be equal or greater than 4+/5 and pain free to improve ability to return to ADLs and fitness activities without difficulty.    Baseline  R hip extension initially at 3+/5, worst with knee extended (07/21/2019); 5/5 bilaterally hip extension (08/31/2019; 11/18/2019);    Time  6    Period  Weeks    Status  Achieved    Target Date  10/26/19      PT LONG TERM GOAL #4   Title  Complete community, work and/or recreational activities without limitation due to current condition.    Baseline  currently unable to run or work out as usual, limiting lifting, etc (07/21/2019); able to workout without pain but haven't trial running yet.(08/31/2019); have been able to return to short and light running but having difficulty managing graded return and lacks stability in improvement and is not yet back to prior 4 days a week unrestricted; also is limited in reaching, turning head, driving, caring for children (09/14/2019); has returned to running but is limited by how many miles she can run including recent flair up, has improved ability to use L arm and move head but still getting pain with end range head turning (11/18/2019);    Time  12    Period  Weeks    Status  Partially Met    Target Date  02/10/20      PT LONG  TERM GOAL #5   Title  Be independent with a long-term home exercise program for self-management of symptoms.    Baseline  initial HEP provided at IE (07/21/2019); continue updating 08/31/2019; cervical spine added 09/14/2019); continues to participate appropriately but has not yet reached discharge (11/18/2019);    Time  12    Period  Weeks    Status  Partially Met    Target Date  02/10/20      PT LONG TERM GOAL #6   Title  Patient will able to complete graded running program (HEP) for 45 minutes without pain to ensure a safe return to running.    Baseline  Started 10 mins running program (08/31/2019); Reports being able to run up to 6.5 miles comfortably until last saturday when she had lasting pain exacerbation of 8/10 that is down to 4/10 following 8 mile run (11/18/2019);    Time  12    Period  Weeks    Status  Partially Met    Target Date  02/10/20      PT LONG TERM GOAL #7   Title  Reduce L shoulder and neck max pain with functional activities to equal or less than 1/10 to allow patient to complete usual activities including ADLs, IADLs, and social engagement with less difficulty.    Baseline  7-8/10 max pain (09/14/2019); max pain in last 2 weeks 5/10 but only one episode, otherwise up to 1-2/10 (11/18/2019);    Time  12    Period  Weeks    Status  Partially Met    Target Date  02/10/20      PT LONG TERM GOAL #8   Title  Increase L side cervical rotation ROM to 60 degrees to be able to check blind spots when driving.    Baseline  40 degrees with pain (09/14/2019); 58 with pain (11/18/2019);  Time  12    Period  Weeks    Status  Partially Met    Target Date  02/10/20            Plan - 12/02/19 1936    Clinical Impression Statement  Patient tolerated treatment well and demonstrates improved pain in the neck and arm. Session focused on pain control for neck/shoulder and right glute region. Patient shows improvement in neck pain with decreased radiation to shoulder and down  back but continues to have very tight and tender cervical musculature, with pronounced active trigger points in right paraspinals. Performed further L first rib mobilizations this session with no lasting pain. R glute/hip pain was addressed with limited improvement with joint mobilizations and dry needling was performed with strong muscle twitch response and soreness following. Patient continues to be limited in her ability to work out and run due to impairments and functional limitations involving the neck, shoulder, and glute. Patient would benefit from continued management of limiting condition by skilled physical therapist to address remaining impairments and functional limitations to work towards stated goals and return to PLOF or maximal functional independence.    Personal Factors and Comorbidities  Age;Education;Behavior Pattern;Social Background;Fitness;Past/Current Experience;Comorbidity 1    Comorbidities  history of R hip pain; chronic L shoulder pain    Examination-Activity Limitations  Bend;Caring for Others;Carry;Lift;Transfers;Squat;Sit;Dressing;Stand;Bed Mobility;Reach Overhead   running, lifting weights   Examination-Participation Restrictions  Other;Community Activity;Interpersonal Relationship;Cleaning;Driving   work, farm Mudlogger, fitness activities   Stability/Clinical Decision Making  Evolving/Moderate complexity    Rehab Potential  Good    PT Frequency  Other (comment)   up to 2 times per week as needed   PT Duration  12 weeks    PT Treatment/Interventions  ADLs/Self Care Home Management;Cryotherapy;Electrical Stimulation;Moist Heat;Therapeutic activities;Therapeutic exercise;Balance training;Neuromuscular re-education;Patient/family education;Manual techniques;Dry needling;Passive range of motion;Spinal Manipulations;Joint Manipulations;Traction    PT Next Visit Plan  continue core, glute, and functional progression as tolerated. continue progressing cervical spine/shoulder  strengthening and specific exercise as tolerated    PT Home Exercise Plan  Medbridge: Lumbar spine Access Code: [email or web address]; cervical spine Access Code: [email or web address]; nerve floss Access Code: [email or web address]    Consulted and Agree with Plan of Care  Patient       Patient will benefit from skilled therapeutic intervention in order to improve the following deficits and impairments:  Decreased strength, Impaired flexibility, Increased muscle spasms, Pain, Decreased activity tolerance, Decreased endurance, Decreased mobility, Difficulty walking, Impaired perceived functional ability, Increased fascial restricitons, Decreased range of motion, Impaired UE functional use, Postural dysfunction, Hypermobility, Improper body mechanics  Visit Diagnosis: Cervicalgia  Acute left-sided low back pain without sciatica  Chronic left shoulder pain  Paresthesia of skin  Pain in right hip  Muscle weakness (generalized)     Problem List Patient Active Problem List   Diagnosis Date Noted  . Acute left-sided low back pain 07/19/2019  . Rash/skin eruption 07/16/2019  . Preventative health care 04/16/2018    Everlean Alstrom. Graylon Good, PT, DPT 12/02/19, 7:39 PM  [facility] [address], [place], [address] Phone: [phone]   Fax:  [phone]  Name: LARISSA PEGG MRN: [id number] Date of Birth: Jul 17, 1986

## 2019-12-06 ENCOUNTER — Ambulatory Visit
Admission: RE | Admit: 2019-12-06 | Discharge: 2019-12-06 | Disposition: A | Discharge status: Home / Self Care | Payer: No Typology Code available for payment source | Source: Ambulatory Visit | Attending: Obstetrics and Gynecology | Admitting: Obstetrics and Gynecology

## 2019-12-06 DIAGNOSIS — R928 Other abnormal and inconclusive findings on diagnostic imaging of breast: Secondary | ICD-10-CM | POA: Insufficient documentation

## 2019-12-06 DIAGNOSIS — N6002 Solitary cyst of left breast: Secondary | ICD-10-CM | POA: Insufficient documentation

## 2019-12-07 ENCOUNTER — Other Ambulatory Visit: Payer: Self-pay

## 2019-12-07 ENCOUNTER — Encounter: Payer: Self-pay | Admitting: Physical Therapy

## 2019-12-07 ENCOUNTER — Ambulatory Visit: Payer: No Typology Code available for payment source | Attending: Family Medicine | Admitting: Physical Therapy

## 2019-12-07 DIAGNOSIS — M25551 Pain in right hip: Secondary | ICD-10-CM | POA: Insufficient documentation

## 2019-12-07 DIAGNOSIS — G8929 Other chronic pain: Secondary | ICD-10-CM | POA: Diagnosis present

## 2019-12-07 DIAGNOSIS — R202 Paresthesia of skin: Secondary | ICD-10-CM | POA: Diagnosis present

## 2019-12-07 DIAGNOSIS — M545 Low back pain, unspecified: Secondary | ICD-10-CM

## 2019-12-07 DIAGNOSIS — M542 Cervicalgia: Secondary | ICD-10-CM | POA: Diagnosis present

## 2019-12-07 DIAGNOSIS — M25512 Pain in left shoulder: Secondary | ICD-10-CM | POA: Insufficient documentation

## 2019-12-07 DIAGNOSIS — M6281 Muscle weakness (generalized): Secondary | ICD-10-CM | POA: Insufficient documentation

## 2019-12-07 NOTE — Therapy (Signed)
[facility] [address] [place], [address] Phone: [phone]   Fax:  [phone]  Physical Therapy Treatment / Progress Note Reporting Period: 09/07/2019 - 12/07/2019  Patient Details  Name: Lori Mcgee MRN: [id number] Date of Birth: [birth year] Referring Provider (PT): Tower, Wynelle Fanny, MD   Encounter Date: 12/07/2019  PT End of Session - 12/07/19 1911    Visit Number  20    Number of Visits  40    Date for PT Re-Evaluation  02/10/20    Authorization Type  [employer] reporting period from 09/07/2019    Authorization - Visit Number  10    Authorization - Number of Visits  10    PT Start Time  1625    PT Stop Time  1704    PT Time Calculation (min)  39 min    Activity Tolerance  Patient tolerated treatment well;Patient limited by pain    Behavior During Therapy  [facility] for tasks assessed/performed       Past Medical History:  Diagnosis Date  . BV (bacterial vaginosis)     Past Surgical History:  Procedure Laterality Date  . HYSTEROSCOPY N/A 10/31/2016   Procedure: HYSTEROSCOPY WITH IUD REMOVAL;  Surgeon: Rubie Maid, MD;  Location: [facility] ORS;  Service: Gynecology;  Laterality: N/A;  . iud extraction  10/31/2016  . WISDOM TOOTH EXTRACTION      There were no vitals filed for this visit.  Subjective Assessment - 12/07/19 1809    Subjective  Patient reports she is feeling pretty good today in the right gluteal region (1/10) and left shoulder (0/10)  but has been avoiding running or overhead press since last treatment session. She thinks the left shoulder feels better partically due to the first rib MET performed last session. States most of her pain is in the right side of her neck into her upper trap. Rates this as 2/10 at the moment.    Pertinent History  Patient is a 34 y.o. female who presents to outpatient physical therapy with a referral for medical diagnosis acute left-sided low back pain  without sciatica. This patient's chief complaints consist of pain in the L glute and lumbar spine causing difficulty with motions involving lumbopelvic region leading to the following functional deficits: difficulty sitting, bending, lifting, twisting, transferring, prolonged standing, bed mobility. Patient reports sudden onset while stepping backwards 07/18/2019 and no prior history of this type of problem. Relevant past medical history and comorbidities include patient was recently treated for poison ivy that caused a rash over the left flank taking a 15 day taper of prednisone that ended yesterday. History of R hip pain earlier this year that has resolved.    Limitations  Sitting;Other (comment);House hold activities;Lifting    Diagnostic tests  none    Patient Stated Goals  resolve pain and return to prior level of activity    Currently in Pain?  Yes    Pain Score  3     Pain Location  Neck    Pain Orientation  Right    Pain Onset  More than a month ago    Pain Radiating Towards  also has pain in the left shoulder and right glute and low back with some activities.      OBJECTIVE See notes from 11/18/2019 and 11/29/2019 for latest objective measures.     TREATMENT:  Manual therapy:to reduce pain and tissue tension, improve range of motion, neuromodulation, in order to  promote improved ability to complete functional activities. - supine STM with trigger point release to posterior cervical paraspinals, R >L, upper traps, levator scap.  - seated first rib MET with manual pressure anterior and inferior on L first rib just anterior to R upper trap through several breathing cycles. - seated L fist rib mobilization, attempted x2 but discontinued due to concordant pain at left shoulder.  - Supine cervical retraction with clinician OP x10, no pain - Supine cervical retraction to extension with distraction as tolerated x3, end range pain  - cervical spine distraction interspersed throughout manual  therapy to improve pain/stiffness.   Modality: (unbilled) Dry Needling: (1) 41m .25 needles placed along the R UT with pincer grasp, to decrease increased muscular spasms and trigger points with the patient positioned in supine; good localized twitch response x2.  (1) 639m.25 needles placed along the R cervical paraspinals (approx. C4) with needle placed one finger breadth from spinous process, with pistoning in inferior-medial direction, patient positioned in prone. Patient was educated on risks and benefits of therapy and verbally consents to PT. Dry needling performed by ChShelton SilvasPT, DPT who is certified in this technique.  Therapeutic exercise: to centralize symptoms and improve ROM, strength, muscular endurance, and activity tolerance required for successful completion of functional activities.  - lat pull down with stepped scapular depression, x15 at 25#, 2x10 at 45# and ended due to feeling a bit more discomfort in L shoulder region.   HOME EXERCISE PROGRAM Access Code: [email or web address] URL: [email or web address] Date: 11/29/2019  Prepared by: SaRosita Kea Exercises Median Nerve Flossing - Tray - 1 sets - 15 reps - 3x daily Radial Nerve Flossing - 1 sets - 15 reps - 3x daily Ulnar Nerve Flossing - 1 sets - 15 reps - 3x daily    PT Education - 12/07/19 1811    Education Details  intervention purpose/form. Self management techniques    Person(s) Educated  Patient    Methods  Explanation;Demonstration;Tactile cues;Verbal cues;Handout    Comprehension  Returned demonstration;Verbal cues required;Need further instruction;Tactile cues required;Verbalized understanding       PT Short Term Goals - 11/18/19 1916      PT SHORT TERM GOAL #1   Title  Be independent with initial home exercise program for self-management of symptoms.    Baseline  initial HEP provided at IE (07/21/2019); updated HEP (09/14/2019)    Time  2    Period  Weeks    Status  Achieved     Target Date  09/28/19        PT Long Term Goals - 12/07/19 1919      PT LONG TERM GOAL #1   Title  Demonstrate improved FOTO score to equal or greater than 87 to demonstrate improvement in overall condition and self-reported functional ability.    Baseline  FOTO = 63 (07/21/2019); FOTO = 83 (08/31/2019); FOTO = 72 (11/18/2019);    Time  12    Period  Weeks    Status  Partially Met    Target Date  02/10/20      PT LONG TERM GOAL #2   Title  Have full lumbar AROM with no compensations or increase in pain in all planes except intermittent end range discomfort to allow patient to complete valued activities with less difficulty.    Baseline  painful, see objective exam (07/21/2019); No pain with lumbar extension (08/31/2019)    Time  6    Period  Weeks  Status  Achieved    Target Date  10/26/19      PT LONG TERM GOAL #3   Title  Bilateral hip extension strength will be equal or greater than 4+/5 and pain free to improve ability to return to ADLs and fitness activities without difficulty.    Baseline  R hip extension initially at 3+/5, worst with knee extended (07/21/2019); 5/5 bilaterally hip extension (08/31/2019; 11/18/2019);    Time  6    Period  Weeks    Status  Achieved    Target Date  10/26/19      PT LONG TERM GOAL #4   Title  Complete community, work and/or recreational activities without limitation due to current condition.    Baseline  currently unable to run or work out as usual, limiting lifting, etc (07/21/2019); able to workout without pain but haven't trial running yet.(08/31/2019); have been able to return to short and light running but having difficulty managing graded return and lacks stability in improvement and is not yet back to prior 4 days a week unrestricted; also is limited in reaching, turning head, driving, caring for children (09/14/2019); has returned to running but is limited by how many miles she can run including recent flair up, has improved ability to use  L arm and move head but still getting pain with end range head turning (11/18/2019); limited in running and overhead lifting (12/07/2019):    Time  12    Period  Weeks    Status  Partially Met    Target Date  02/10/20      PT LONG TERM GOAL #5   Title  Be independent with a long-term home exercise program for self-management of symptoms.    Baseline  initial HEP provided at IE (07/21/2019); continue updating 08/31/2019; cervical spine added 09/14/2019); continues to participate appropriately but has not yet reached discharge (11/18/2019, 12/07/2019);    Time  12    Period  Weeks    Status  Partially Met    Target Date  02/10/20      PT LONG TERM GOAL #6   Title  Patient will able to complete graded running program (HEP) for 45 minutes without pain to ensure a safe return to running.    Baseline  Started 10 mins running program (08/31/2019); Reports being able to run up to 6.5 miles comfortably until last saturday when she had lasting pain exacerbation of 8/10 that is down to 4/10 following 8 mile run (11/18/2019);    Time  12    Period  Weeks    Status  Partially Met    Target Date  02/10/20      PT LONG TERM GOAL #7   Title  Reduce L shoulder and neck max pain with functional activities to equal or less than 1/10 to allow patient to complete usual activities including ADLs, IADLs, and social engagement with less difficulty.    Baseline  7-8/10 max pain (09/14/2019); max pain in last 2 weeks 5/10 but only one episode, otherwise up to 1-2/10 (11/18/2019);    Time  12    Period  Weeks    Status  Partially Met    Target Date  02/10/20      PT LONG TERM GOAL #8   Title  Increase L side cervical rotation ROM to 60 degrees to be able to check blind spots when driving.    Baseline  40 degrees with pain (09/14/2019); 58 with pain (11/18/2019);    Time  12    Period  Weeks    Status  Partially Met    Target Date  02/10/20            Plan - 12/07/19 1919    Clinical Impression Statement   Patient tolerated treatment well overall. Continues to be very sensitive to palpation with spasm and tight tissue bands notable at R cervical paraspinals and upper traps. Got strong twitch response to that region with dry needling and was quite sore following. Learned she could improve her form with lat pull. Patient has attended 20 physical therapy sessions and demonstrates overall progress towards goals. However, she has recently had more problems from her neck and shoulder region so has returned for more regular visits as it has restricted her from her usual workout routing She also continues to be limited in running by pain at right glute region. Prior objective examination demonstrates continued improvement in low back pain with lumbar AROM as well as continued pain free and strong hip extension MMT. Patient continues to have flair-ups as she returns to running and her goal of completing a 50 mile race. She continues to demonstrate significant impairment such as pain, joint/muscle stiffness, decreased activity tolerance, muscle spasm, and decreased motor control that contribute to functional limitations such as use of L UE, running, turning head during driving, reaching, carrying, and negatively impacting quality of life. Patient would benefit from continued management of limiting condition by skilled physical therapist to address remaining impairments and functional limitations to work towards stated goals and return to PLOF or maximal functional independence. Recommend attending physical therapy up to 2 times a week as outlined in current POC to allow patient to continue progressing towards her goals and achieve PLOF.    Personal Factors and Comorbidities  Age;Education;Behavior Pattern;Social Background;Fitness;Past/Current Experience;Comorbidity 1    Comorbidities  history of R hip pain; chronic L shoulder pain    Examination-Activity Limitations  Bend;Caring for  Others;Carry;Lift;Transfers;Squat;Sit;Dressing;Stand;Bed Mobility;Reach Overhead   running, lifting weights   Examination-Participation Restrictions  Other;Community Activity;Interpersonal Relationship;Cleaning;Driving   work, farm Mudlogger, fitness activities   Stability/Clinical Decision Making  Evolving/Moderate complexity    Rehab Potential  Good    PT Frequency  Other (comment)   up to 2 times per week as needed   PT Duration  12 weeks    PT Treatment/Interventions  ADLs/Self Care Home Management;Cryotherapy;Electrical Stimulation;Moist Heat;Therapeutic activities;Therapeutic exercise;Balance training;Neuromuscular re-education;Patient/family education;Manual techniques;Dry needling;Passive range of motion;Spinal Manipulations;Joint Manipulations;Traction    PT Next Visit Plan  continue core, glute, and functional progression as tolerated. continue progressing cervical spine/shoulder strengthening and specific exercise as tolerated    PT Home Exercise Plan  Medbridge: Lumbar spine Access Code: [email or web address]; cervical spine Access Code: [email or web address]; nerve floss Access Code: [email or web address]    Consulted and Agree with Plan of Care  Patient       Patient will benefit from skilled therapeutic intervention in order to improve the following deficits and impairments:  Decreased strength, Impaired flexibility, Increased muscle spasms, Pain, Decreased activity tolerance, Decreased endurance, Decreased mobility, Difficulty walking, Impaired perceived functional ability, Increased fascial restricitons, Decreased range of motion, Impaired UE functional use, Postural dysfunction, Hypermobility, Improper body mechanics  Visit Diagnosis: Cervicalgia  Acute left-sided low back pain without sciatica  Chronic left shoulder pain  Paresthesia of skin  Pain in right hip  Muscle weakness (generalized)     Problem List Patient Active Problem List   Diagnosis Date Noted  . Acute left-sided low back pain  07/19/2019  .  Rash/skin eruption 07/16/2019  . Preventative health care 04/16/2018   Everlean Alstrom. Graylon Good, PT, DPT 12/07/19, 7:22 PM  [facility] [address], [place], [address] Phone: [phone]   Fax:  [phone]  Name: Lori Mcgee MRN: [id number] Date of Birth: 1986/05/05

## 2019-12-13 ENCOUNTER — Ambulatory Visit: Payer: No Typology Code available for payment source | Admitting: Physical Therapy

## 2019-12-14 ENCOUNTER — Ambulatory Visit: Payer: No Typology Code available for payment source | Admitting: Physical Therapy

## 2019-12-14 ENCOUNTER — Other Ambulatory Visit: Payer: Self-pay

## 2019-12-14 ENCOUNTER — Encounter: Payer: Self-pay | Admitting: Physical Therapy

## 2019-12-14 DIAGNOSIS — M542 Cervicalgia: Secondary | ICD-10-CM

## 2019-12-14 DIAGNOSIS — R202 Paresthesia of skin: Secondary | ICD-10-CM

## 2019-12-14 DIAGNOSIS — M6281 Muscle weakness (generalized): Secondary | ICD-10-CM

## 2019-12-14 DIAGNOSIS — M545 Low back pain, unspecified: Secondary | ICD-10-CM

## 2019-12-14 DIAGNOSIS — M25551 Pain in right hip: Secondary | ICD-10-CM

## 2019-12-14 DIAGNOSIS — G8929 Other chronic pain: Secondary | ICD-10-CM

## 2019-12-14 DIAGNOSIS — M25512 Pain in left shoulder: Secondary | ICD-10-CM

## 2019-12-14 NOTE — Therapy (Signed)
[facility] [address], [place], [address] Phone: [phone]   Fax:  [phone]  Physical Therapy Treatment  Patient Details  Name: Lori Mcgee MRN: [id number] Date of Birth: [birth year] Referring Provider (PT): Tower, Wynelle Fanny, MD   Encounter Date: 12/14/2019  PT End of Session - 12/14/19 1957    Visit Number  21    Number of Visits  40    Date for PT Re-Evaluation  02/10/20    Authorization Type  [employer] reporting period from 12/14/2019    Authorization - Visit Number  1    Authorization - Number of Visits  10    PT Start Time  1840    PT Stop Time  1930    PT Time Calculation (min)  50 min    Activity Tolerance  Patient tolerated treatment well;Patient limited by pain    Behavior During Therapy  [facility] for tasks assessed/performed       Past Medical History:  Diagnosis Date   BV (bacterial vaginosis)     Past Surgical History:  Procedure Laterality Date   HYSTEROSCOPY N/A 10/31/2016   Procedure: HYSTEROSCOPY WITH IUD REMOVAL;  Surgeon: Rubie Maid, MD;  Location: [facility] ORS;  Service: Gynecology;  Laterality: N/A;   iud extraction  10/31/2016   WISDOM TOOTH EXTRACTION      There were no vitals filed for this visit.  Subjective Assessment - 12/14/19 1839    Subjective  Patient reports she feels a bit of dull achy pain in the anterior R hip (1-2/10 currently, 3-4/10 during lunges) that she noticed during lunges today that feels a pinching. Her neck and shoulder are doing better. She has returned to overhead press and had a bit of difficulty (weak) but not painful. Feels like she really benefitted from dry needling last treatment session to the neck. Is continuing to have some right sided neck pain when she sleeping on the right side. She did run in [place] 3 miles and she had the same hurts after feels fine while doing pain. Goes back to dull achy after 2 days. She just got done with her  workout today. And was riding in the car a lot yesterday coming home [place].    Pertinent History  Patient is a 34 y.o. female who presents to outpatient physical therapy with a referral for medical diagnosis acute left-sided low back pain without sciatica. This patient's chief complaints consist of pain in the L glute and lumbar spine causing difficulty with motions involving lumbopelvic region leading to the following functional deficits: difficulty sitting, bending, lifting, twisting, transferring, prolonged standing, bed mobility. Patient reports sudden onset while stepping backwards 07/18/2019 and no prior history of this type of problem. Relevant past medical history and comorbidities include patient was recently treated for poison ivy that caused a rash over the left flank taking a 15 day taper of prednisone that ended yesterday. History of R hip pain earlier this year that has resolved.    Limitations  Sitting;Other (comment);House hold activities;Lifting    Diagnostic tests  none    Patient Stated Goals  resolve pain and return to prior level of activity    Currently in Pain?  Yes    Pain Score  2     Pain Onset  More than a month ago         TREATMENT:  Manual therapy: to reduce pain and tissue tension, improve range of motion, neuromodulation, in  order to promote improved ability to complete functional activities. - supine long axis distraction at right hip joint - supine right hip joint mobilizations with mob belt, grade III-IV posterolateral, caudal, lateral. With and without external rotation PROM stretch to same hip.   Therapeutic exercise: to centralize symptoms and improve ROM, strength, muscular endurance, and activity tolerance required for successful completion of functional activities. For improved hip/LE/core motor control, strength, and endurance.  - trials of front lunge to assess movement quality and check for change following bouts of manual therapy - hip windmills with  toe on ground, several attempts made with and without sliders with difficulty maintaining balance.  - supine bridge marching, 2x10 each side (unable to fully stabilize pelvis - supine bridge/hamstring curl with feet on ball, 4x5 - side plank with clam shell, 2x10 each side - SL RDL to high knee with weight lateral, 2x10 - quadruped donkey kick, with 10#DB held behind knee, 2x10 each side - SLS trunk rotation against 5# cable, 2x10 each side, each direction in relationship to foot. To strengthen hip rotators and ankle stabilizers .  - Education on HEP including handout   [place]       PT Education - 12/14/19 1957    Education Details  intervention purpose/form. Self management techniques. HEP    Person(s) Educated  Patient    Methods  Explanation;Demonstration;Tactile cues;Verbal cues;Handout    Comprehension  Verbalized understanding;Returned demonstration;Verbal cues required;Need further instruction;Tactile cues required       PT Short Term Goals - 11/18/19 1916      PT SHORT TERM GOAL #1   Title  Be independent with initial home exercise program for self-management of symptoms.    Baseline  initial HEP provided at IE (07/21/2019); updated HEP (09/14/2019)    Time  2    Period  Weeks    Status  Achieved    Target Date  09/28/19        PT Long Term Goals - 12/07/19 1919      PT LONG TERM GOAL #1   Title  Demonstrate improved FOTO score to equal or greater than 87 to demonstrate improvement in overall condition and self-reported functional ability.    Baseline  FOTO = 63 (07/21/2019); FOTO = 83 (08/31/2019); FOTO = 72 (11/18/2019);    Time  12    Period  Weeks    Status  Partially Met    Target Date  02/10/20      PT LONG TERM GOAL #2   Title  Have full lumbar AROM with no compensations or increase in pain in all planes except intermittent end range discomfort to allow patient to complete valued activities with less difficulty.    Baseline  painful, see  objective exam (07/21/2019); No pain with lumbar extension (08/31/2019)    Time  6    Period  Weeks    Status  Achieved    Target Date  10/26/19      PT LONG TERM GOAL #3   Title  Bilateral hip extension strength will be equal or greater than 4+/5 and pain free to improve ability to return to ADLs and fitness activities without difficulty.    Baseline  R hip extension initially at 3+/5, worst with knee extended (07/21/2019); 5/5 bilaterally hip extension (08/31/2019; 11/18/2019);    Time  6    Period  Weeks    Status  Achieved    Target Date  10/26/19      PT LONG TERM  GOAL #4   Title  Complete community, work and/or recreational activities without limitation due to current condition.    Baseline  currently unable to run or work out as usual, limiting lifting, etc (07/21/2019); able to workout without pain but haven't trial running yet.(08/31/2019); have been able to return to short and light running but having difficulty managing graded return and lacks stability in improvement and is not yet back to prior 4 days a week unrestricted; also is limited in reaching, turning head, driving, caring for children (09/14/2019); has returned to running but is limited by how many miles she can run including recent flair up, has improved ability to use L arm and move head but still getting pain with end range head turning (11/18/2019); limited in running and overhead lifting (12/07/2019):    Time  12    Period  Weeks    Status  Partially Met    Target Date  02/10/20      PT LONG TERM GOAL #5   Title  Be independent with a long-term home exercise program for self-management of symptoms.    Baseline  initial HEP provided at IE (07/21/2019); continue updating 08/31/2019; cervical spine added 09/14/2019); continues to participate appropriately but has not yet reached discharge (11/18/2019, 12/07/2019);    Time  12    Period  Weeks    Status  Partially Met    Target Date  02/10/20      PT LONG TERM GOAL #6    Title  Patient will able to complete graded running program (HEP) for 45 minutes without pain to ensure a safe return to running.    Baseline  Started 10 mins running program (08/31/2019); Reports being able to run up to 6.5 miles comfortably until last saturday when she had lasting pain exacerbation of 8/10 that is down to 4/10 following 8 mile run (11/18/2019);    Time  12    Period  Weeks    Status  Partially Met    Target Date  02/10/20      PT LONG TERM GOAL #7   Title  Reduce L shoulder and neck max pain with functional activities to equal or less than 1/10 to allow patient to complete usual activities including ADLs, IADLs, and social engagement with less difficulty.    Baseline  7-8/10 max pain (09/14/2019); max pain in last 2 weeks 5/10 but only one episode, otherwise up to 1-2/10 (11/18/2019);    Time  12    Period  Weeks    Status  Partially Met    Target Date  02/10/20      PT LONG TERM GOAL #8   Title  Increase L side cervical rotation ROM to 60 degrees to be able to check blind spots when driving.    Baseline  40 degrees with pain (09/14/2019); 58 with pain (11/18/2019);    Time  12    Period  Weeks    Status  Partially Met    Target Date  02/10/20            Plan - 12/14/19 1957    Clinical Impression Statement  Patient tolerated treatment well overall. She got relief from anterior hip pain from manual therapy and was able to continue on to more hip/LE/core exercises to improve proprioception, motor control, strength, and endurance surrounding the right hip during single leg stance exercises such as running. Patient was limited in airplane exercise due to sensation of tightness developing in the right hip,  but was able to complete remaining exercises without obvious worsening of this feeling. She did voluntarily continue some R hip figure 4 stretches independently by end of session suggesting that the hip was a bit irritated, but mostly reported fatigue during session.  Updated HEP with well tolerated exercises. Neck and UE appears to be doing better, so did not focus on that region today. Patient would benefit from continued management of limiting condition by skilled physical therapist to address remaining impairments and functional limitations to work towards stated goals and return to PLOF or maximal functional independence.    Personal Factors and Comorbidities  Age;Education;Behavior Pattern;Social Background;Fitness;Past/Current Experience;Comorbidity 1    Comorbidities  history of R hip pain; chronic L shoulder pain    Examination-Activity Limitations  Bend;Caring for Others;Carry;Lift;Transfers;Squat;Sit;Dressing;Stand;Bed Mobility;Reach Overhead   running, lifting weights   Examination-Participation Restrictions  Other;Community Activity;Interpersonal Relationship;Cleaning;Driving   work, farm Mudlogger, fitness activities   Stability/Clinical Decision Making  Evolving/Moderate complexity    Rehab Potential  Good    PT Frequency  Other (comment)   up to 2 times per week as needed   PT Duration  12 weeks    PT Treatment/Interventions  ADLs/Self Care Home Management;Cryotherapy;Electrical Stimulation;Moist Heat;Therapeutic activities;Therapeutic exercise;Balance training;Neuromuscular re-education;Patient/family education;Manual techniques;Dry needling;Passive range of motion;Spinal Manipulations;Joint Manipulations;Traction    PT Next Visit Plan  continue core, glute, and functional progression as tolerated. continue progressing cervical spine/shoulder strengthening and specific exercise as tolerated    PT Home Exercise Plan  [employer]: Lumbar spine Access Code: [email or web address]; cervical spine Access Code: [email or web address]; nerve floss Access Code: [email or web address]    Consulted and Agree with Plan of Care  Patient       Patient will benefit from skilled therapeutic intervention in order to improve the following deficits and impairments:  Decreased strength, Impaired  flexibility, Increased muscle spasms, Pain, Decreased activity tolerance, Decreased endurance, Decreased mobility, Difficulty walking, Impaired perceived functional ability, Increased fascial restricitons, Decreased range of motion, Impaired UE functional use, Postural dysfunction, Hypermobility, Improper body mechanics  Visit Diagnosis: Cervicalgia  Acute left-sided low back pain without sciatica  Chronic left shoulder pain  Paresthesia of skin  Pain in right hip  Muscle weakness (generalized)     Problem List Patient Active Problem List   Diagnosis Date Noted   Acute left-sided low back pain 07/19/2019   Rash/skin eruption 07/16/2019   Preventative health care 04/16/2018    Everlean Alstrom. Graylon Good, PT, DPT 12/14/19, 7:58 PM  [facility] [address], [place], [address] Phone: [phone]   Fax:  [phone]  Name: SHANTE ARCHAMBEAULT MRN: [id number] Date of Birth: 1986/08/16

## 2019-12-16 ENCOUNTER — Ambulatory Visit: Payer: No Typology Code available for payment source | Admitting: Physical Therapy

## 2019-12-16 ENCOUNTER — Other Ambulatory Visit: Payer: Self-pay

## 2019-12-16 ENCOUNTER — Encounter: Payer: Self-pay | Admitting: Physical Therapy

## 2019-12-16 DIAGNOSIS — M542 Cervicalgia: Secondary | ICD-10-CM | POA: Diagnosis not present

## 2019-12-16 DIAGNOSIS — M25551 Pain in right hip: Secondary | ICD-10-CM

## 2019-12-16 DIAGNOSIS — M6281 Muscle weakness (generalized): Secondary | ICD-10-CM

## 2019-12-16 DIAGNOSIS — R202 Paresthesia of skin: Secondary | ICD-10-CM

## 2019-12-16 DIAGNOSIS — M545 Low back pain, unspecified: Secondary | ICD-10-CM

## 2019-12-16 DIAGNOSIS — G8929 Other chronic pain: Secondary | ICD-10-CM

## 2019-12-16 NOTE — Therapy (Signed)
[facility] [address], [place], [address] Phone: [phone]   Fax:  [phone]  Physical Therapy Treatment  Patient Details  Name: Lori Mcgee MRN: [id number] Date of Birth: 1986/03/11 Referring Provider (PT): Tower, Wynelle Fanny, MD   Encounter Date: 12/16/2019  PT End of Session - 12/16/19 1940    Visit Number  22    Number of Visits  40    Date for PT Re-Evaluation  02/10/20    Authorization Type  [employer] reporting period from 12/14/2019    Authorization - Visit Number  2    Authorization - Number of Visits  10    PT Start Time  1835    PT Stop Time  1920    PT Time Calculation (min)  45 min    Activity Tolerance  Patient tolerated treatment well;Patient limited by pain    Behavior During Therapy  [facility] for tasks assessed/performed       Past Medical History:  Diagnosis Date  . BV (bacterial vaginosis)     Past Surgical History:  Procedure Laterality Date  . HYSTEROSCOPY N/A 10/31/2016   Procedure: HYSTEROSCOPY WITH IUD REMOVAL;  Surgeon: Rubie Maid, MD;  Location: [facility] ORS;  Service: Gynecology;  Laterality: N/A;  . iud extraction  10/31/2016  . WISDOM TOOTH EXTRACTION      There were no vitals filed for this visit.  Subjective Assessment - 12/16/19 1831    Subjective  Patient reports she is sore in her right glute today, but it feels more like DOMS than her usual soreness. She did a crossfit workout yesterday that started hurting a it in the left shoulder. She just got done working out today and has a lot of pain in the left shoulder now with weakness and pain down the left arm (rated 4/10 anterior L shoulder). She still has trouble sleeping on the right side due to pain at the R shoulder.    Pertinent History  Patient is a 34 y.o. female who presents to outpatient physical therapy with a referral for medical diagnosis acute left-sided low back pain without sciatica. This  patient's chief complaints consist of pain in the L glute and lumbar spine causing difficulty with motions involving lumbopelvic region leading to the following functional deficits: difficulty sitting, bending, lifting, twisting, transferring, prolonged standing, bed mobility. Patient reports sudden onset while stepping backwards 07/18/2019 and no prior history of this type of problem. Relevant past medical history and comorbidities include patient was recently treated for poison ivy that caused a rash over the left flank taking a 15 day taper of prednisone that ended yesterday. History of R hip pain earlier this year that has resolved.    Limitations  Sitting;Other (comment);House hold activities;Lifting    Diagnostic tests  none    Patient Stated Goals  resolve pain and return to prior level of activity    Currently in Pain?  Yes    Pain Score  4     Pain Location  Shoulder    Pain Orientation  Left;Anterior    Pain Descriptors / Indicators  Aching   earlier was sharp with numb feeling down left arm, burning as well   Pain Onset  More than a month ago       TREATMENT:  Manual therapy: to reduce pain and tissue tension, improve range of motion, neuromodulation, in order to promote improved ability to complete functional activities. - seated first rib MET  with manual pressure anterior and inferior onLfirst rib just anterior to R upper trap through several breathing cycles. - seated L fist rib mobilization in seated position, attempted x2 but discontinued due to discomfort.  - seated CT junction distraction grade V mobilization, cavitation present.  - prone CT junction rotational mobilization grade V x 2 head rotated R, x 1 head rotated left. Cavitatoin each direction.  - prone CPA grade III-IV upper thoracic to lower thoracic spine with KE wedge, x 5 each level - prone CPA grade V at upper, mid, and lower thoracic spine, no cavitations.  - hooklying thoracic manipulation (grade V CP mob)  over foam roll at mid, lower, and upper thoracic spine with cavitation noted at mid and lower locations. Force through shoulders with hands clasped behind head.  - supineSTM to posterior cervical paraspinals, including upper traps, levator scap, L>R. Also targeted left scalenes and SCM - supine left first rib depression mobilization grade III-IV as tolerated.  - supine right PROM  sidebending cervical spine stretch as tolerated 3x30 seconds.  - cervical spine distraction interspersed throughout manual therapy to improve pain/stiffness.   Therapeutic exercise:to centralize symptoms and improve ROM, strength, muscular endurance, and activity tolerance required for successful completion of functional activities. For improved hip/LE/core motor control, strength, and endurance.   - lat pull down with stepped scapular depression, x15, x 10 at 35# (stopped due to getting "angry" - Sidelying open book (thoracic rotation) to improve thoracic, shoulder girdle, and upper trunk mobility. Required instruction for technique and cuing to achieve end range as tolerated, hold time, and breathing technique. 5 second holds. x5-10 each side. "felt good" - Education on HEP including handout   HOME EXERCISE PROGRAM Access Code: [email or web address]  URL: [email or web address]  Date: 12/16/2019  Prepared by: Rosita Kea   Exercises Sidelying Open Book Thoracic Lumbar Rotation and Extension - 20 reps - 5 seconds hold - 1-2x daily     PT Education - 12/16/19 1939    Education Details  intervention purpose/form. Self management techniques. HEP    Person(s) Educated  Patient    Methods  Explanation;Demonstration;Tactile cues;Verbal cues;Handout    Comprehension  Verbalized understanding;Verbal cues required;Returned demonstration;Need further instruction;Tactile cues required       PT Short Term Goals - 11/18/19 1916      PT SHORT TERM GOAL #1   Title  Be independent with initial home exercise program  for self-management of symptoms.    Baseline  initial HEP provided at IE (07/21/2019); updated HEP (09/14/2019)    Time  2    Period  Weeks    Status  Achieved    Target Date  09/28/19        PT Long Term Goals - 12/07/19 1919      PT LONG TERM GOAL #1   Title  Demonstrate improved FOTO score to equal or greater than 87 to demonstrate improvement in overall condition and self-reported functional ability.    Baseline  FOTO = 63 (07/21/2019); FOTO = 83 (08/31/2019); FOTO = 72 (11/18/2019);    Time  12    Period  Weeks    Status  Partially Met    Target Date  02/10/20      PT LONG TERM GOAL #2   Title  Have full lumbar AROM with no compensations or increase in pain in all planes except intermittent end range discomfort to allow patient to complete valued activities with less difficulty.    Baseline  painful, see objective exam (07/21/2019);  No pain with lumbar extension (08/31/2019)    Time  6    Period  Weeks    Status  Achieved    Target Date  10/26/19      PT LONG TERM GOAL #3   Title  Bilateral hip extension strength will be equal or greater than 4+/5 and pain free to improve ability to return to ADLs and fitness activities without difficulty.    Baseline  R hip extension initially at 3+/5, worst with knee extended (07/21/2019); 5/5 bilaterally hip extension (08/31/2019; 11/18/2019);    Time  6    Period  Weeks    Status  Achieved    Target Date  10/26/19      PT LONG TERM GOAL #4   Title  Complete community, work and/or recreational activities without limitation due to current condition.    Baseline  currently unable to run or work out as usual, limiting lifting, etc (07/21/2019); able to workout without pain but haven't trial running yet.(08/31/2019); have been able to return to short and light running but having difficulty managing graded return and lacks stability in improvement and is not yet back to prior 4 days a week unrestricted; also is limited in reaching, turning head,  driving, caring for children (09/14/2019); has returned to running but is limited by how many miles she can run including recent flair up, has improved ability to use L arm and move head but still getting pain with end range head turning (11/18/2019); limited in running and overhead lifting (12/07/2019):    Time  12    Period  Weeks    Status  Partially Met    Target Date  02/10/20      PT LONG TERM GOAL #5   Title  Be independent with a long-term home exercise program for self-management of symptoms.    Baseline  initial HEP provided at IE (07/21/2019); continue updating 08/31/2019; cervical spine added 09/14/2019); continues to participate appropriately but has not yet reached discharge (11/18/2019, 12/07/2019);    Time  12    Period  Weeks    Status  Partially Met    Target Date  02/10/20      PT LONG TERM GOAL #6   Title  Patient will able to complete graded running program (HEP) for 45 minutes without pain to ensure a safe return to running.    Baseline  Started 10 mins running program (08/31/2019); Reports being able to run up to 6.5 miles comfortably until last saturday when she had lasting pain exacerbation of 8/10 that is down to 4/10 following 8 mile run (11/18/2019);    Time  12    Period  Weeks    Status  Partially Met    Target Date  02/10/20      PT LONG TERM GOAL #7   Title  Reduce L shoulder and neck max pain with functional activities to equal or less than 1/10 to allow patient to complete usual activities including ADLs, IADLs, and social engagement with less difficulty.    Baseline  7-8/10 max pain (09/14/2019); max pain in last 2 weeks 5/10 but only one episode, otherwise up to 1-2/10 (11/18/2019);    Time  12    Period  Weeks    Status  Partially Met    Target Date  02/10/20      PT LONG TERM GOAL #8   Title  Increase L side cervical rotation ROM to 60 degrees to be able to check blind  spots when driving.    Baseline  40 degrees with pain (09/14/2019); 58 with pain  (11/18/2019);    Time  12    Period  Weeks    Status  Partially Met    Target Date  02/10/20            Plan - 12/16/19 1939    Clinical Impression Statement  Patient tolerated treatment well overall and left arm pain was decreased following manual therapy, especially interventions targeting CT junction and left first rib. Completed lat pull down to activate lower periscapular musculature, but discontinued when it started to bother left shoulder/arm. Patient also got relief form open book exercise, so added to HEP while patient is away over the next week. Educated on ways to position the cervical spine for more stability while riding roller coasters next week and reviewed nerve glide. Patient would benefit from continued management of limiting condition by skilled physical therapist to address remaining impairments and functional limitations to work towards stated goals and return to PLOF or maximal functional independence.    Personal Factors and Comorbidities  Age;Education;Behavior Pattern;Social Background;Fitness;Past/Current Experience;Comorbidity 1    Comorbidities  history of R hip pain; chronic L shoulder pain    Examination-Activity Limitations  Bend;Caring for Others;Carry;Lift;Transfers;Squat;Sit;Dressing;Stand;Bed Mobility;Reach Overhead   running, lifting weights   Examination-Participation Restrictions  Other;Community Activity;Interpersonal Relationship;Cleaning;Driving   work, farm Mudlogger, fitness activities   Stability/Clinical Decision Making  Evolving/Moderate complexity    Rehab Potential  Good    PT Frequency  Other (comment)   up to 2 times per week as needed   PT Duration  12 weeks    PT Treatment/Interventions  ADLs/Self Care Home Management;Cryotherapy;Electrical Stimulation;Moist Heat;Therapeutic activities;Therapeutic exercise;Balance training;Neuromuscular re-education;Patient/family education;Manual techniques;Dry needling;Passive range of motion;Spinal  Manipulations;Joint Manipulations;Traction    PT Next Visit Plan  continue core, glute, and functional progression as tolerated. continue progressing cervical spine/shoulder strengthening and specific exercise as tolerated    PT Home Exercise Plan  Medbridge: Lumbar spine Access Code: [email or web address]; cervical spine Access Code: [email or web address]; nerve floss Access Code: [email or web address]    Consulted and Agree with Plan of Care  Patient       Patient will benefit from skilled therapeutic intervention in order to improve the following deficits and impairments:  Decreased strength, Impaired flexibility, Increased muscle spasms, Pain, Decreased activity tolerance, Decreased endurance, Decreased mobility, Difficulty walking, Impaired perceived functional ability, Increased fascial restricitons, Decreased range of motion, Impaired UE functional use, Postural dysfunction, Hypermobility, Improper body mechanics  Visit Diagnosis: Cervicalgia  Acute left-sided low back pain without sciatica  Chronic left shoulder pain  Paresthesia of skin  Pain in right hip  Muscle weakness (generalized)     Problem List Patient Active Problem List   Diagnosis Date Noted  . Acute left-sided low back pain 07/19/2019  . Rash/skin eruption 07/16/2019  . Preventative health care 04/16/2018    Everlean Alstrom. Graylon Good, PT, DPT 12/16/19, 7:41 PM  [facility] [address] [place], [address] Phone: [phone]   Fax:  [phone]  Name: NADIYAH ZEIS MRN: [id number] Date of Birth: 08-08-1986

## 2019-12-28 ENCOUNTER — Other Ambulatory Visit: Payer: Self-pay

## 2019-12-28 ENCOUNTER — Encounter: Payer: Self-pay | Admitting: Physical Therapy

## 2019-12-28 ENCOUNTER — Ambulatory Visit: Payer: No Typology Code available for payment source | Admitting: Physical Therapy

## 2019-12-28 DIAGNOSIS — M25551 Pain in right hip: Secondary | ICD-10-CM

## 2019-12-28 DIAGNOSIS — M542 Cervicalgia: Secondary | ICD-10-CM | POA: Diagnosis not present

## 2019-12-28 DIAGNOSIS — M545 Low back pain, unspecified: Secondary | ICD-10-CM

## 2019-12-28 DIAGNOSIS — M6281 Muscle weakness (generalized): Secondary | ICD-10-CM

## 2019-12-28 DIAGNOSIS — G8929 Other chronic pain: Secondary | ICD-10-CM

## 2019-12-28 DIAGNOSIS — R202 Paresthesia of skin: Secondary | ICD-10-CM

## 2019-12-28 DIAGNOSIS — M25512 Pain in left shoulder: Secondary | ICD-10-CM

## 2019-12-28 NOTE — Therapy (Signed)
[place] [facility] [address] [place], [address] Phone: [phone]   Fax:  [phone]  Physical Therapy Treatment  Patient Details  Name: Lori Mcgee MRN: [id number] Date of Birth: [birth year] Referring Provider (PT): Tower, Wynelle Fanny, MD   Encounter Date: 12/28/2019  PT End of Session - 12/28/19 1944    Visit Number  23    Number of Visits  40    Date for PT Re-Evaluation  02/10/20    Authorization Type  [employer] reporting period from 12/14/2019    Authorization - Visit Number  3    Authorization - Number of Visits  10    PT Start Time  1835    PT Stop Time  1930    PT Time Calculation (min)  55 min    Activity Tolerance  Patient tolerated treatment well;Patient limited by pain    Behavior During Therapy  [facility] for tasks assessed/performed       Past Medical History:  Diagnosis Date  . BV (bacterial vaginosis)     Past Surgical History:  Procedure Laterality Date  . HYSTEROSCOPY N/A 10/31/2016   Procedure: HYSTEROSCOPY WITH IUD REMOVAL;  Surgeon: Rubie Maid, MD;  Location: [facility] ORS;  Service: Gynecology;  Laterality: N/A;  . iud extraction  10/31/2016  . WISDOM TOOTH EXTRACTION      There were no vitals filed for this visit.  Subjective Assessment - 12/28/19 1845    Subjective  Patient reports she is frustrated because her pain in the left UE has moved to her lateral proximal forearm near where the radial nerve runs and was very uncomfortable during her recent vacation up to 6/10 constant pain that she could not relieve. She left for the trip the morning after her last PT session and it hurt through the entire session. She was disturbed especially because she "wasn't doing anything" during vacation compared to her usual activity and exercise when at home. She rode al of the roller coasters, but did not start this until the second half of the vacation and this did not seem to make it any  worse. The first half she was at the pool a lot but did not swim. She states her pain there has decreased to 2/10 but is still really bothering her. She reports her R scapular pain continues to be relieved after last dry needling session but she does have some pain in the neck to to the top of the R shoulder intermittently. She reports she has been doing the most recently prescribed exercises for her right hip/glute and they seem to be really helping. She is having minimal discomfort there and has gotten much better at the single leg dead lift variation, almost as steady on the right leg as when standing on the left leg.    Pertinent History  Patient is a 34 y.o. female who presents to outpatient physical therapy with a referral for medical diagnosis acute left-sided low back pain without sciatica. This patient's chief complaints consist of pain in the L glute and lumbar spine causing difficulty with motions involving lumbopelvic region leading to the following functional deficits: difficulty sitting, bending, lifting, twisting, transferring, prolonged standing, bed mobility. Patient reports sudden onset while stepping backwards 07/18/2019 and no prior history of this type of problem. Relevant past medical history and comorbidities include patient was recently treated for poison ivy that caused a rash over the left flank taking a 15 day taper of  prednisone that ended yesterday. History of R hip pain earlier this year that has resolved.    Limitations  Sitting;Other (comment);House hold activities;Lifting    Diagnostic tests  none    Patient Stated Goals  resolve pain and return to prior level of activity    Currently in Pain?  Yes    Pain Score  2     Pain Location  Arm   and neck   Pain Orientation  Left    Pain Descriptors / Indicators  Aching    Pain Onset  More than a month ago       TREATMENT:  Manual therapy:to reduce pain and tissue tension, improve range of motion, neuromodulation, in  order to promote improved ability to complete functional activities. - supineSTM to posterior cervical paraspinals, including upper traps, levator scap, L>R.  - supine left first rib depression mobilization grade III-IV as tolerated.  - supine L PROM  sidebending cervical spine stretch with overpressure with and without blocking of the cervical spine to pivot around mid-cervical segments, 10 - 20 reps.  - supine L PROM rotation with overpressure x 10 - supine cervical retraction with clinician overpressure 2x10 - supine cervical retraction with clinician overpressure to extension x 3 (reproduced L upper trap and arm pain too strongly to continue).  - cervical spine distraction interspersed throughout manual therapy to improve pain/stiffness. - PROM L radial nerve slider nerve floss technique x 15 - STM to left forearm including trigger point release to help relieve pain from muscle reactivity.   Therapeutic exercise:to centralize symptoms and improve ROM, strength, muscular endurance, and activity tolerance required for successful completion of functional activities.For improved hip/LE/core motor control, strength, and endurance.   - standing L radial nerve floss x 15 plus trial of variations to determine most helpful and least painful exercise.  - supine deep neck flexion x5 second holds x 5.  - education on purpose of exercises, HEP.   HOME EXERCISE PROGRAM Access Code: [email or web address]      URL: [email or web address]    Date: 12/16/2019  Prepared by: Rosita Kea   Exercises Sidelying Open Book Thoracic Lumbar Rotation and Extension - 20 reps - 5 seconds hold - 1-2x daily    PT Education - 12/28/19 1943    Education Details  intervention purpose/form. Self management techniques    Person(s) Educated  Patient    Methods  Explanation;Demonstration;Verbal cues;Tactile cues    Comprehension  Verbalized understanding;Returned demonstration;Verbal cues required;Need further  instruction       PT Short Term Goals - 11/18/19 1916      PT SHORT TERM GOAL #1   Title  Be independent with initial home exercise program for self-management of symptoms.    Baseline  initial HEP provided at IE (07/21/2019); updated HEP (09/14/2019)    Time  2    Period  Weeks    Status  Achieved    Target Date  09/28/19        PT Long Term Goals - 12/07/19 1919      PT LONG TERM GOAL #1   Title  Demonstrate improved FOTO score to equal or greater than 87 to demonstrate improvement in overall condition and self-reported functional ability.    Baseline  FOTO = 63 (07/21/2019); FOTO = 83 (08/31/2019); FOTO = 72 (11/18/2019);    Time  12    Period  Weeks    Status  Partially Met    Target Date  02/10/20      PT  LONG TERM GOAL #2   Title  Have full lumbar AROM with no compensations or increase in pain in all planes except intermittent end range discomfort to allow patient to complete valued activities with less difficulty.    Baseline  painful, see objective exam (07/21/2019); No pain with lumbar extension (08/31/2019)    Time  6    Period  Weeks    Status  Achieved    Target Date  10/26/19      PT LONG TERM GOAL #3   Title  Bilateral hip extension strength will be equal or greater than 4+/5 and pain free to improve ability to return to ADLs and fitness activities without difficulty.    Baseline  R hip extension initially at 3+/5, worst with knee extended (07/21/2019); 5/5 bilaterally hip extension (08/31/2019; 11/18/2019);    Time  6    Period  Weeks    Status  Achieved    Target Date  10/26/19      PT LONG TERM GOAL #4   Title  Complete community, work and/or recreational activities without limitation due to current condition.    Baseline  currently unable to run or work out as usual, limiting lifting, etc (07/21/2019); able to workout without pain but haven't trial running yet.(08/31/2019); have been able to return to short and light running but having difficulty managing  graded return and lacks stability in improvement and is not yet back to prior 4 days a week unrestricted; also is limited in reaching, turning head, driving, caring for children (09/14/2019); has returned to running but is limited by how many miles she can run including recent flair up, has improved ability to use L arm and move head but still getting pain with end range head turning (11/18/2019); limited in running and overhead lifting (12/07/2019):    Time  12    Period  Weeks    Status  Partially Met    Target Date  02/10/20      PT LONG TERM GOAL #5   Title  Be independent with a long-term home exercise program for self-management of symptoms.    Baseline  initial HEP provided at IE (07/21/2019); continue updating 08/31/2019; cervical spine added 09/14/2019); continues to participate appropriately but has not yet reached discharge (11/18/2019, 12/07/2019);    Time  12    Period  Weeks    Status  Partially Met    Target Date  02/10/20      PT LONG TERM GOAL #6   Title  Patient will able to complete graded running program (HEP) for 45 minutes without pain to ensure a safe return to running.    Baseline  Started 10 mins running program (08/31/2019); Reports being able to run up to 6.5 miles comfortably until last saturday when she had lasting pain exacerbation of 8/10 that is down to 4/10 following 8 mile run (11/18/2019);    Time  12    Period  Weeks    Status  Partially Met    Target Date  02/10/20      PT LONG TERM GOAL #7   Title  Reduce L shoulder and neck max pain with functional activities to equal or less than 1/10 to allow patient to complete usual activities including ADLs, IADLs, and social engagement with less difficulty.    Baseline  7-8/10 max pain (09/14/2019); max pain in last 2 weeks 5/10 but only one episode, otherwise up to 1-2/10 (11/18/2019);    Time  12  Period  Weeks    Status  Partially Met    Target Date  02/10/20      PT LONG TERM GOAL #8   Title  Increase L side  cervical rotation ROM to 60 degrees to be able to check blind spots when driving.    Baseline  40 degrees with pain (09/14/2019); 58 with pain (11/18/2019);    Time  12    Period  Weeks    Status  Partially Met    Target Date  02/10/20            Plan - 12/28/19 1953    Clinical Impression Statement  Patient tolerated treatment well overall but continued to have L forearm pain. She was TTP at the left forearm with spasm noted in the extensor bundle. She also had positive neurodynamic test for L radial nerve, and had reproduction of symptoms with pressure to the L first rib and cervical retraction with extension. Manual techniques minimized stress at CT junction and attempted to provide relief through repeated motions at the joints, and soft tissue mobilization. Patient continues to have significant pain that is limiting her quality of life and functional use of L UE. Patient appears to be doing well with management of R hip and did not require further intervention there today. Patient would benefit from continued management of limiting condition by skilled physical therapist to address remaining impairments and functional limitations to work towards stated goals and return to PLOF or maximal functional independence.    Personal Factors and Comorbidities  Age;Education;Behavior Pattern;Social Background;Fitness;Past/Current Experience;Comorbidity 1    Comorbidities  history of R hip pain; chronic L shoulder pain    Examination-Activity Limitations  Bend;Caring for Others;Carry;Lift;Transfers;Squat;Sit;Dressing;Stand;Bed Mobility;Reach Overhead   running, lifting weights   Examination-Participation Restrictions  Other;Community Activity;Interpersonal Relationship;Cleaning;Driving   work, farm Mudlogger, fitness activities   Stability/Clinical Decision Making  Evolving/Moderate complexity    Rehab Potential  Good    PT Frequency  Other (comment)   up to 2 times per week as needed   PT Duration   12 weeks    PT Treatment/Interventions  ADLs/Self Care Home Management;Cryotherapy;Electrical Stimulation;Moist Heat;Therapeutic activities;Therapeutic exercise;Balance training;Neuromuscular re-education;Patient/family education;Manual techniques;Dry needling;Passive range of motion;Spinal Manipulations;Joint Manipulations;Traction    PT Next Visit Plan  continue core, glute, and functional progression as tolerated. continue progressing cervical spine/shoulder strengthening and specific exercise as tolerated    PT Home Exercise Plan  Medbridge: Lumbar spine Access Code: [email or web address]; cervical spine Access Code: [email or web address]; nerve floss Access Code: [email or web address]    Consulted and Agree with Plan of Care  Patient       Patient will benefit from skilled therapeutic intervention in order to improve the following deficits and impairments:  Decreased strength, Impaired flexibility, Increased muscle spasms, Pain, Decreased activity tolerance, Decreased endurance, Decreased mobility, Difficulty walking, Impaired perceived functional ability, Increased fascial restricitons, Decreased range of motion, Impaired UE functional use, Postural dysfunction, Hypermobility, Improper body mechanics  Visit Diagnosis: Cervicalgia  Acute left-sided low back pain without sciatica  Chronic left shoulder pain  Paresthesia of skin  Pain in right hip  Muscle weakness (generalized)     Problem List Patient Active Problem List   Diagnosis Date Noted  . Acute left-sided low back pain 07/19/2019  . Rash/skin eruption 07/16/2019  . Preventative health care 04/16/2018    Everlean Alstrom. Graylon Good, PT, DPT 12/28/19, 7:54 PM  [place] [facility] [address] [place], [address] Phone: [phone]  Fax:  [phone]  Name: Lori Mcgee MRN: [id number] Date of Birth: Sep 28, 1986

## 2019-12-30 ENCOUNTER — Ambulatory Visit: Payer: No Typology Code available for payment source | Admitting: Physical Therapy

## 2020-01-06 ENCOUNTER — Other Ambulatory Visit: Payer: Self-pay

## 2020-01-06 ENCOUNTER — Ambulatory Visit: Payer: No Typology Code available for payment source | Attending: Family Medicine

## 2020-01-06 DIAGNOSIS — M542 Cervicalgia: Secondary | ICD-10-CM | POA: Insufficient documentation

## 2020-01-06 DIAGNOSIS — M545 Low back pain, unspecified: Secondary | ICD-10-CM

## 2020-01-06 DIAGNOSIS — M25551 Pain in right hip: Secondary | ICD-10-CM | POA: Insufficient documentation

## 2020-01-06 DIAGNOSIS — M6281 Muscle weakness (generalized): Secondary | ICD-10-CM | POA: Diagnosis present

## 2020-01-06 DIAGNOSIS — M25512 Pain in left shoulder: Secondary | ICD-10-CM | POA: Diagnosis present

## 2020-01-06 DIAGNOSIS — R202 Paresthesia of skin: Secondary | ICD-10-CM | POA: Insufficient documentation

## 2020-01-06 DIAGNOSIS — G8929 Other chronic pain: Secondary | ICD-10-CM | POA: Diagnosis present

## 2020-01-06 NOTE — Therapy (Signed)
[place] [facility] [address], [place], [address] Phone: [phone]   Fax:  [phone]  Physical Therapy Treatment  Patient Details  Name: Lori Mcgee MRN: [id number] Date of Birth: 18-Jun-1986 Referring Provider (PT): Tower, Wynelle Fanny, MD   Encounter Date: 01/06/2020  PT End of Session - 01/06/20 1549    Visit Number  24    Number of Visits  40    Date for PT Re-Evaluation  02/10/20    Authorization Type  [employer] reporting period from 12/14/2019    Authorization - Visit Number  4    Authorization - Number of Visits  10    PT Start Time  4888    PT Stop Time  1600    PT Time Calculation (min)  45 min    Activity Tolerance  Patient tolerated treatment well;Patient limited by pain    Behavior During Therapy  [facility] for tasks assessed/performed       Past Medical History:  Diagnosis Date  . BV (bacterial vaginosis)     Past Surgical History:  Procedure Laterality Date  . HYSTEROSCOPY N/A 10/31/2016   Procedure: HYSTEROSCOPY WITH IUD REMOVAL;  Surgeon: Rubie Maid, MD;  Location: [facility] ORS;  Service: Gynecology;  Laterality: N/A;  . iud extraction  10/31/2016  . WISDOM TOOTH EXTRACTION      There were no vitals filed for this visit.  Subjective Assessment - 01/06/20 1523    Subjective  Patient reports increased pain with asending hilsls and trail running. Patient had increased pain since last friday when she was trail running. Patient reports increased neck pain but no radiating symptoms.    Pertinent History  Patient is a 34 y.o. female who presents to outpatient physical therapy with a referral for medical diagnosis acute left-sided low back pain without sciatica. This patient's chief complaints consist of pain in the L glute and lumbar spine causing difficulty with motions involving lumbopelvic region leading to the following functional deficits: difficulty sitting, bending, lifting, twisting,  transferring, prolonged standing, bed mobility. Patient reports sudden onset while stepping backwards 07/18/2019 and no prior history of this type of problem. Relevant past medical history and comorbidities include patient was recently treated for poison ivy that caused a rash over the left flank taking a 15 day taper of prednisone that ended yesterday. History of R hip pain earlier this year that has resolved.    Limitations  Sitting;Other (comment);House hold activities;Lifting    Diagnostic tests  none    Patient Stated Goals  resolve pain and return to prior level of activity    Currently in Pain?  Yes    Pain Score  2     Pain Location  Hip   neck   Pain Orientation  Right    Pain Descriptors / Indicators  Aching    Pain Type  Chronic pain    Pain Onset  More than a month ago    Pain Frequency  Intermittent       TREATMENT Therapeutic Exercise Hip abduction with leg behind in sidelying -- 2 x 10 Cervical extension with retraction -- x 10 Ulnar nerve glide in sitting -- x 15  Exercises performed to improve pain and spasms Manual therapy Dry Needling with E-stim 4 ma '10Hz'  Frequency -  (2) .3x 100 using two dry needles along the glute med with patient in sidelying for 10 min to decrease increased pain and spasms  STM performed to  the glute med with patient in sidelying to decrease increased spasms and pain   PT Education - 01/06/20 1546    Education Details  Education on glute meds influence    Person(s) Educated  Patient    Methods  Explanation;Demonstration    Comprehension  Verbalized understanding;Returned demonstration       PT Short Term Goals - 11/18/19 1916      PT SHORT TERM GOAL #1   Title  Be independent with initial home exercise program for self-management of symptoms.    Baseline  initial HEP provided at IE (07/21/2019); updated HEP (09/14/2019)    Time  2    Period  Weeks    Status  Achieved    Target Date  09/28/19        PT Long Term Goals - 12/07/19  1919      PT LONG TERM GOAL #1   Title  Demonstrate improved FOTO score to equal or greater than 87 to demonstrate improvement in overall condition and self-reported functional ability.    Baseline  FOTO = 63 (07/21/2019); FOTO = 83 (08/31/2019); FOTO = 72 (11/18/2019);    Time  12    Period  Weeks    Status  Partially Met    Target Date  02/10/20      PT LONG TERM GOAL #2   Title  Have full lumbar AROM with no compensations or increase in pain in all planes except intermittent end range discomfort to allow patient to complete valued activities with less difficulty.    Baseline  painful, see objective exam (07/21/2019); No pain with lumbar extension (08/31/2019)    Time  6    Period  Weeks    Status  Achieved    Target Date  10/26/19      PT LONG TERM GOAL #3   Title  Bilateral hip extension strength will be equal or greater than 4+/5 and pain free to improve ability to return to ADLs and fitness activities without difficulty.    Baseline  R hip extension initially at 3+/5, worst with knee extended (07/21/2019); 5/5 bilaterally hip extension (08/31/2019; 11/18/2019);    Time  6    Period  Weeks    Status  Achieved    Target Date  10/26/19      PT LONG TERM GOAL #4   Title  Complete community, work and/or recreational activities without limitation due to current condition.    Baseline  currently unable to run or work out as usual, limiting lifting, etc (07/21/2019); able to workout without pain but haven't trial running yet.(08/31/2019); have been able to return to short and light running but having difficulty managing graded return and lacks stability in improvement and is not yet back to prior 4 days a week unrestricted; also is limited in reaching, turning head, driving, caring for children (09/14/2019); has returned to running but is limited by how many miles she can run including recent flair up, has improved ability to use L arm and move head but still getting pain with end range head  turning (11/18/2019); limited in running and overhead lifting (12/07/2019):    Time  12    Period  Weeks    Status  Partially Met    Target Date  02/10/20      PT LONG TERM GOAL #5   Title  Be independent with a long-term home exercise program for self-management of symptoms.    Baseline  initial HEP provided at IE (07/21/2019);  continue updating 08/31/2019; cervical spine added 09/14/2019); continues to participate appropriately but has not yet reached discharge (11/18/2019, 12/07/2019);    Time  12    Period  Weeks    Status  Partially Met    Target Date  02/10/20      PT LONG TERM GOAL #6   Title  Patient will able to complete graded running program (HEP) for 45 minutes without pain to ensure a safe return to running.    Baseline  Started 10 mins running program (08/31/2019); Reports being able to run up to 6.5 miles comfortably until last saturday when she had lasting pain exacerbation of 8/10 that is down to 4/10 following 8 mile run (11/18/2019);    Time  12    Period  Weeks    Status  Partially Met    Target Date  02/10/20      PT LONG TERM GOAL #7   Title  Reduce L shoulder and neck max pain with functional activities to equal or less than 1/10 to allow patient to complete usual activities including ADLs, IADLs, and social engagement with less difficulty.    Baseline  7-8/10 max pain (09/14/2019); max pain in last 2 weeks 5/10 but only one episode, otherwise up to 1-2/10 (11/18/2019);    Time  12    Period  Weeks    Status  Partially Met    Target Date  02/10/20      PT LONG TERM GOAL #8   Title  Increase L side cervical rotation ROM to 60 degrees to be able to check blind spots when driving.    Baseline  40 degrees with pain (09/14/2019); 58 with pain (11/18/2019);    Time  12    Period  Weeks    Status  Partially Met    Target Date  02/10/20            Plan - 01/06/20 1604    Clinical Impression Statement  Performed manula therapy and dry needling over L glute med which  produced condorant pain during intervention but relief of pain after intervention was completed. Patient responds well to session. Follow up with hip abduction into extension with a glute med bias which produced pain instantly. Educated patient to perform this ther ex at home to improve guarding and increase strength along that muscle. Patient will benefit from furhter skilled therapy focused on improving these limitations to return to prior level of function.    Personal Factors and Comorbidities  Age;Education;Behavior Pattern;Social Background;Fitness;Past/Current Experience;Comorbidity 1    Comorbidities  history of R hip pain; chronic L shoulder pain    Examination-Activity Limitations  Bend;Caring for Others;Carry;Lift;Transfers;Squat;Sit;Dressing;Stand;Bed Mobility;Reach Overhead   running, lifting weights   Examination-Participation Restrictions  Other;Community Activity;Interpersonal Relationship;Cleaning;Driving   work, farm Mudlogger, fitness activities   Stability/Clinical Decision Making  Evolving/Moderate complexity    Rehab Potential  Good    PT Frequency  Other (comment)   up to 2 times per week as needed   PT Duration  12 weeks    PT Treatment/Interventions  ADLs/Self Care Home Management;Cryotherapy;Electrical Stimulation;Moist Heat;Therapeutic activities;Therapeutic exercise;Balance training;Neuromuscular re-education;Patient/family education;Manual techniques;Dry needling;Passive range of motion;Spinal Manipulations;Joint Manipulations;Traction    PT Next Visit Plan  continue core, glute, and functional progression as tolerated. continue progressing cervical spine/shoulder strengthening and specific exercise as tolerated    PT Home Exercise Plan  Medbridge: Lumbar spine Access Code: [email or web address]; cervical spine Access Code: [email or web address]; nerve floss Access Code: [email or web address]    Consulted and Agree  with Plan of Care  Patient       Patient will benefit from skilled therapeutic intervention  in order to improve the following deficits and impairments:  Decreased strength, Impaired flexibility, Increased muscle spasms, Pain, Decreased activity tolerance, Decreased endurance, Decreased mobility, Difficulty walking, Impaired perceived functional ability, Increased fascial restricitons, Decreased range of motion, Impaired UE functional use, Postural dysfunction, Hypermobility, Improper body mechanics  Visit Diagnosis: Cervicalgia  Acute left-sided low back pain without sciatica  Chronic left shoulder pain     Problem List Patient Active Problem List   Diagnosis Date Noted  . Acute left-sided low back pain 07/19/2019  . Rash/skin eruption 07/16/2019  . Preventative health care 04/16/2018    Blythe Stanford, PT DPT 01/06/2020, 6:06 PM  [place] [facility] [address], [place], [address] Phone: [phone]   Fax:  [phone]  Name: BEONKA AMESQUITA MRN: [id number] Date of Birth: 05-25-1986

## 2020-01-11 ENCOUNTER — Ambulatory Visit: Payer: No Typology Code available for payment source

## 2020-01-11 ENCOUNTER — Other Ambulatory Visit: Payer: Self-pay

## 2020-01-11 DIAGNOSIS — M545 Low back pain, unspecified: Secondary | ICD-10-CM

## 2020-01-11 DIAGNOSIS — M542 Cervicalgia: Secondary | ICD-10-CM | POA: Diagnosis not present

## 2020-01-11 NOTE — Therapy (Signed)
[facility] [address], [place], [address] Phone: [phone]   Fax:  [phone]  Physical Therapy Treatment  Patient Details  Name: Lori Mcgee MRN: [id number] Date of Birth: 1985/12/23 Referring Provider (PT): Tower, Wynelle Fanny, MD   Encounter Date: 01/11/2020  PT End of Session - 01/11/20 1328    Visit Number  25    Number of Visits  40    Date for PT Re-Evaluation  02/10/20    Authorization Type  [employer] reporting period from 12/14/2019    Authorization - Visit Number  5    Authorization - Number of Visits  10    PT Start Time  0815    PT Stop Time  0900    PT Time Calculation (min)  45 min    Activity Tolerance  Patient tolerated treatment well;Patient limited by pain    Behavior During Therapy  [facility] for tasks assessed/performed       Past Medical History:  Diagnosis Date  . BV (bacterial vaginosis)     Past Surgical History:  Procedure Laterality Date  . HYSTEROSCOPY N/A 10/31/2016   Procedure: HYSTEROSCOPY WITH IUD REMOVAL;  Surgeon: Rubie Maid, MD;  Location: [facility] ORS;  Service: Gynecology;  Laterality: N/A;  . iud extraction  10/31/2016  . WISDOM TOOTH EXTRACTION      There were no vitals filed for this visit.  Subjective Assessment - 01/11/20 0829    Subjective  Patient states her hip is feeling sore today. Patient states her arm and neck have been improving but continues to have pain with prolonged.    Pertinent History  Patient is a 34 y.o. female who presents to outpatient physical therapy with a referral for medical diagnosis acute left-sided low back pain without sciatica. This patient's chief complaints consist of pain in the L glute and lumbar spine causing difficulty with motions involving lumbopelvic region leading to the following functional deficits: difficulty sitting, bending, lifting, twisting, transferring, prolonged standing, bed mobility. Patient reports  sudden onset while stepping backwards 07/18/2019 and no prior history of this type of problem. Relevant past medical history and comorbidities include patient was recently treated for poison ivy that caused a rash over the left flank taking a 15 day taper of prednisone that ended yesterday. History of R hip pain earlier this year that has resolved.    Limitations  Sitting;Other (comment);House hold activities;Lifting    Diagnostic tests  none    Patient Stated Goals  resolve pain and return to prior level of activity    Currently in Pain?  Yes    Pain Score  4     Pain Location  Hip    Pain Orientation  Right    Pain Descriptors / Indicators  Aching    Pain Type  Chronic pain    Pain Onset  More than a month ago    Pain Frequency  Intermittent           TREATMENT Therapeutic Exercise Scapular depression/retraction with arms behind back - x 20  Open books with half foam on table in supine - x 20 Foam roller over thoracic spine in supine - x20   Exercises performed to improve pain and spasms Manual therapy 1st rib mobs in supine grade III - reproduction of pain 6 x 30sec B to decrease increased spasms and decrease in pain Dry Needling with E-stim 4 ma '10Hz'  Frequency -  (2) .3x 100 using  two dry needles along the glute med with patient in sidelying for 10 min to decrease increased pain and spasms   STM performed to the glute med with patient in sidelying to decrease increased spasms and pain   PT Education - 01/11/20 1327    Education Details  form/technique with exercise    Person(s) Educated  Patient    Methods  Explanation;Demonstration    Comprehension  Returned demonstration;Verbalized understanding       PT Short Term Goals - 11/18/19 1916      PT SHORT TERM GOAL #1   Title  Be independent with initial home exercise program for self-management of symptoms.    Baseline  initial HEP provided at [facility] (07/21/2019); updated HEP (09/14/2019)    Time  2    Period  Weeks     Status  Achieved    Target Date  09/28/19        PT Long Term Goals - 12/07/19 1919      PT LONG TERM GOAL #1   Title  Demonstrate improved FOTO score to equal or greater than 87 to demonstrate improvement in overall condition and self-reported functional ability.    Baseline  FOTO = 63 (07/21/2019); FOTO = 83 (08/31/2019); FOTO = 72 (11/18/2019);    Time  12    Period  Weeks    Status  Partially Met    Target Date  02/10/20      PT LONG TERM GOAL #2   Title  Have full lumbar AROM with no compensations or increase in pain in all planes except intermittent end range discomfort to allow patient to complete valued activities with less difficulty.    Baseline  painful, see objective exam (07/21/2019); No pain with lumbar extension (08/31/2019)    Time  6    Period  Weeks    Status  Achieved    Target Date  10/26/19      PT LONG TERM GOAL #3   Title  Bilateral hip extension strength will be equal or greater than 4+/5 and pain free to improve ability to return to ADLs and fitness activities without difficulty.    Baseline  R hip extension initially at 3+/5, worst with knee extended (07/21/2019); 5/5 bilaterally hip extension (08/31/2019; 11/18/2019);    Time  6    Period  Weeks    Status  Achieved    Target Date  10/26/19      PT LONG TERM GOAL #4   Title  Complete community, work and/or recreational activities without limitation due to current condition.    Baseline  currently unable to run or work out as usual, limiting lifting, etc (07/21/2019); able to workout without pain but haven't trial running yet.(08/31/2019); have been able to return to short and light running but having difficulty managing graded return and lacks stability in improvement and is not yet back to prior 4 days a week unrestricted; also is limited in reaching, turning head, driving, caring for children (09/14/2019); has returned to running but is limited by how many miles she can run including recent flair up, has  improved ability to use L arm and move head but still getting pain with end range head turning (11/18/2019); limited in running and overhead lifting (12/07/2019):    Time  12    Period  Weeks    Status  Partially Met    Target Date  02/10/20      PT LONG TERM GOAL #5   Title  Be independent with a long-term home exercise program for self-management of symptoms.    Baseline  initial HEP provided at [facility] (07/21/2019); continue updating 08/31/2019; cervical spine added 09/14/2019); continues to participate appropriately but has not yet reached discharge (11/18/2019, 12/07/2019);    Time  12    Period  Weeks    Status  Partially Met    Target Date  02/10/20      PT LONG TERM GOAL #6   Title  Patient will able to complete graded running program (HEP) for 45 minutes without pain to ensure a safe return to running.    Baseline  Started 10 mins running program (08/31/2019); Reports being able to run up to 6.5 miles comfortably until last saturday when she had lasting pain exacerbation of 8/10 that is down to 4/10 following 8 mile run (11/18/2019);    Time  12    Period  Weeks    Status  Partially Met    Target Date  02/10/20      PT LONG TERM GOAL #7   Title  Reduce L shoulder and neck max pain with functional activities to equal or less than 1/10 to allow patient to complete usual activities including ADLs, IADLs, and social engagement with less difficulty.    Baseline  7-8/10 max pain (09/14/2019); max pain in last 2 weeks 5/10 but only one episode, otherwise up to 1-2/10 (11/18/2019);    Time  12    Period  Weeks    Status  Partially Met    Target Date  02/10/20      PT LONG TERM GOAL #8   Title  Increase L side cervical rotation ROM to 60 degrees to be able to check blind spots when driving.    Baseline  40 degrees with pain (09/14/2019); 58 with pain (11/18/2019);    Time  12    Period  Weeks    Status  Partially Met    Target Date  02/10/20            Plan - 01/11/20 1328    Clinical  Impression Statement  Decrease in pain at end of session over the hips after performing dry needling with TENS intervention indicating decreased muscular guarding over the affected area. Patient has reporduction of 1st rib pain with manual palpation, educated patient to perform scapular depression/retraction with arms behind back to decrease symptoms. Patient is improving overall and will benefit from further skilled therapy to return to prior level of function.    Personal Factors and Comorbidities  Age;Education;Behavior Pattern;Social Background;Fitness;Past/Current Experience;Comorbidity 1    Comorbidities  history of R hip pain; chronic L shoulder pain    Examination-Activity Limitations  Bend;Caring for Others;Carry;Lift;Transfers;Squat;Sit;Dressing;Stand;Bed Mobility;Reach Overhead   running, lifting weights   Examination-Participation Restrictions  Other;Community Activity;Interpersonal Relationship;Cleaning;Driving   work, farm Mudlogger, fitness activities   Stability/Clinical Decision Making  Evolving/Moderate complexity    Rehab Potential  Good    PT Frequency  Other (comment)   up to 2 times per week as needed   PT Duration  12 weeks    PT Treatment/Interventions  ADLs/Self Care Home Management;Cryotherapy;Electrical Stimulation;Moist Heat;Therapeutic activities;Therapeutic exercise;Balance training;Neuromuscular re-education;Patient/family education;Manual techniques;Dry needling;Passive range of motion;Spinal Manipulations;Joint Manipulations;Traction    PT Next Visit Plan  continue core, glute, and functional progression as tolerated. continue progressing cervical spine/shoulder strengthening and specific exercise as tolerated    PT Home Exercise Plan  Medbridge: Lumbar spine Access Code: [email or web address]; cervical spine Access Code: [email or web address]; nerve floss Access Code:  [email or web address]    Consulted and Agree with Plan of Care  Patient       Patient will benefit from skilled therapeutic  intervention in order to improve the following deficits and impairments:  Decreased strength, Impaired flexibility, Increased muscle spasms, Pain, Decreased activity tolerance, Decreased endurance, Decreased mobility, Difficulty walking, Impaired perceived functional ability, Increased fascial restricitons, Decreased range of motion, Impaired UE functional use, Postural dysfunction, Hypermobility, Improper body mechanics  Visit Diagnosis: Cervicalgia  Acute left-sided low back pain without sciatica     Problem List Patient Active Problem List   Diagnosis Date Noted  . Acute left-sided low back pain 07/19/2019  . Rash/skin eruption 07/16/2019  . Preventative health care 04/16/2018    Blythe Stanford, PT DPT 01/11/2020, 1:32 PM  [facility] [address] [place], [address] Phone: [phone]   Fax:  [phone]  Name: Lori Mcgee MRN: [id number] Date of Birth: December 24, 1985

## 2020-01-26 ENCOUNTER — Ambulatory Visit: Payer: No Typology Code available for payment source | Admitting: Physical Therapy

## 2020-01-26 ENCOUNTER — Other Ambulatory Visit: Payer: Self-pay

## 2020-01-26 ENCOUNTER — Encounter: Payer: Self-pay | Admitting: Physical Therapy

## 2020-01-26 DIAGNOSIS — M25512 Pain in left shoulder: Secondary | ICD-10-CM

## 2020-01-26 DIAGNOSIS — M545 Low back pain, unspecified: Secondary | ICD-10-CM

## 2020-01-26 DIAGNOSIS — M542 Cervicalgia: Secondary | ICD-10-CM | POA: Diagnosis not present

## 2020-01-26 DIAGNOSIS — R202 Paresthesia of skin: Secondary | ICD-10-CM

## 2020-01-26 DIAGNOSIS — M6281 Muscle weakness (generalized): Secondary | ICD-10-CM

## 2020-01-26 DIAGNOSIS — G8929 Other chronic pain: Secondary | ICD-10-CM

## 2020-01-26 DIAGNOSIS — M25551 Pain in right hip: Secondary | ICD-10-CM

## 2020-01-26 NOTE — Therapy (Signed)
[facility] [address], [place], [address] Phone: [phone]   Fax:  [phone]  Physical Therapy Treatment  Patient Details  Name: Lori Mcgee MRN: [id number] Date of Birth: [birth year] Referring Provider (PT): Tower, Wynelle Fanny, MD   Encounter Date: 01/26/2020  PT End of Session - 01/26/20 1437    Visit Number  26    Number of Visits  40    Date for PT Re-Evaluation  02/10/20    Authorization Type  [employer] reporting period from 12/14/2019    Authorization - Visit Number  6    Authorization - Number of Visits  10    PT Start Time  7680    PT Stop Time  1514    PT Time Calculation (min)  42 min    Activity Tolerance  Patient tolerated treatment well;Patient limited by pain    Behavior During Therapy  [facility] for tasks assessed/performed       Past Medical History:  Diagnosis Date  . BV (bacterial vaginosis)     Past Surgical History:  Procedure Laterality Date  . HYSTEROSCOPY N/A 10/31/2016   Procedure: HYSTEROSCOPY WITH IUD REMOVAL;  Surgeon: Rubie Maid, MD;  Location: [facility] ORS;  Service: Gynecology;  Laterality: N/A;  . iud extraction  10/31/2016  . WISDOM TOOTH EXTRACTION      There were no vitals filed for this visit.  Subjective Assessment - 01/26/20 1434    Subjective  Patient reports she feels like she is going longer periods without pain in her hip and her neck is doing pretty well. When the hip pain does come it is not as bad. She has not been running hills or trails. She was sick sunday to sunday last week that she caught from her kids. Tested negative for COVID 19. States pain is 1/10 and more at the glute med. States she felt good following last session. She has not been lifting overhead. Her arm has been pretty good, but she did have some paresthesia for a couple of days that took a while to go away. She has been trying to ease back into doing arm work.    Pertinent History   Patient is a 34 y.o. female who presents to outpatient physical therapy with a referral for medical diagnosis acute left-sided low back pain without sciatica. This patient's chief complaints consist of pain in the L glute and lumbar spine causing difficulty with motions involving lumbopelvic region leading to the following functional deficits: difficulty sitting, bending, lifting, twisting, transferring, prolonged standing, bed mobility. Patient reports sudden onset while stepping backwards 07/18/2019 and no prior history of this type of problem. Relevant past medical history and comorbidities include patient was recently treated for poison ivy that caused a rash over the left flank taking a 15 day taper of prednisone that ended yesterday. History of R hip pain earlier this year that has resolved.    Limitations  Sitting;Other (comment);House hold activities;Lifting    Diagnostic tests  none    Patient Stated Goals  resolve pain and return to prior level of activity    Currently in Pain?  Yes    Pain Score  1     Pain Location  Hip    Pain Orientation  Right    Pain Onset  More than a month ago      OBJECTIVE FOTO = 76 (last measured 01/26/2020)   TREATMENT:  Manual therapy:to reduce pain and tissue  tension, improve range of motion, neuromodulation, in order to promote improved ability to complete functional activities. Prone: - STM to right glutes with trigger point release in deep external rotators, glute med, quadratus femoris, upper fibers of glute max and surrounding tissues. Deep techniques utilized.  - PA joint R hip mobilizations in neutral with mob belt around knee grade III-IV, 4x30 seconds, in ER 4x30 seconds.   Supine:  - Supine R hip IR sustained mobilization with R LE crossed over L with overpressure from PT at R greater trochanter and R knee to stretch R deep external rotator.over 5 minwith occasional oscillations grade II-IV. - long axis distraction at right hip joint.    HOME EXERCISE PROGRAM Access Code: [email or web address] URL: [email or web address] Date: 12/16/2019  Prepared by: Rosita Kea   Exercises Sidelying Open Book Thoracic Lumbar Rotation and Extension - 20 reps - 5 seconds hold - 1-2x daily    PT Education - 01/26/20 1437    Education Details  form/technique with exercise    Person(s) Educated  Patient    Methods  Explanation;Demonstration;Tactile cues;Verbal cues;Handout    Comprehension  Verbalized understanding;Returned demonstration;Verbal cues required;Tactile cues required;Need further instruction       PT Short Term Goals - 11/18/19 1916      PT SHORT TERM GOAL #1   Title  Be independent with initial home exercise program for self-management of symptoms.    Baseline  initial HEP provided at IE (07/21/2019); updated HEP (09/14/2019)    Time  2    Period  Weeks    Status  Achieved    Target Date  09/28/19        PT Long Term Goals - 12/07/19 1919      PT LONG TERM GOAL #1   Title  Demonstrate improved FOTO score to equal or greater than 87 to demonstrate improvement in overall condition and self-reported functional ability.    Baseline  FOTO = 63 (07/21/2019); FOTO = 83 (08/31/2019); FOTO = 72 (11/18/2019);    Time  12    Period  Weeks    Status  Partially Met    Target Date  02/10/20      PT LONG TERM GOAL #2   Title  Have full lumbar AROM with no compensations or increase in pain in all planes except intermittent end range discomfort to allow patient to complete valued activities with less difficulty.    Baseline  painful, see objective exam (07/21/2019); No pain with lumbar extension (08/31/2019)    Time  6    Period  Weeks    Status  Achieved    Target Date  10/26/19      PT LONG TERM GOAL #3   Title  Bilateral hip extension strength will be equal or greater than 4+/5 and pain free to improve ability to return to ADLs and fitness activities without difficulty.    Baseline  R hip extension initially  at 3+/5, worst with knee extended (07/21/2019); 5/5 bilaterally hip extension (08/31/2019; 11/18/2019);    Time  6    Period  Weeks    Status  Achieved    Target Date  10/26/19      PT LONG TERM GOAL #4   Title  Complete community, work and/or recreational activities without limitation due to current condition.    Baseline  currently unable to run or work out as usual, limiting lifting, etc (07/21/2019); able to workout without pain but haven't trial running yet.(08/31/2019); have been able to return to  short and light running but having difficulty managing graded return and lacks stability in improvement and is not yet back to prior 4 days a week unrestricted; also is limited in reaching, turning head, driving, caring for children (09/14/2019); has returned to running but is limited by how many miles she can run including recent flair up, has improved ability to use L arm and move head but still getting pain with end range head turning (11/18/2019); limited in running and overhead lifting (12/07/2019):    Time  12    Period  Weeks    Status  Partially Met    Target Date  02/10/20      PT LONG TERM GOAL #5   Title  Be independent with a long-term home exercise program for self-management of symptoms.    Baseline  initial HEP provided at IE (07/21/2019); continue updating 08/31/2019; cervical spine added 09/14/2019); continues to participate appropriately but has not yet reached discharge (11/18/2019, 12/07/2019);    Time  12    Period  Weeks    Status  Partially Met    Target Date  02/10/20      PT LONG TERM GOAL #6   Title  Patient will able to complete graded running program (HEP) for 45 minutes without pain to ensure a safe return to running.    Baseline  Started 10 mins running program (08/31/2019); Reports being able to run up to 6.5 miles comfortably until last saturday when she had lasting pain exacerbation of 8/10 that is down to 4/10 following 8 mile run (11/18/2019);    Time  12    Period   Weeks    Status  Partially Met    Target Date  02/10/20      PT LONG TERM GOAL #7   Title  Reduce L shoulder and neck max pain with functional activities to equal or less than 1/10 to allow patient to complete usual activities including ADLs, IADLs, and social engagement with less difficulty.    Baseline  7-8/10 max pain (09/14/2019); max pain in last 2 weeks 5/10 but only one episode, otherwise up to 1-2/10 (11/18/2019);    Time  12    Period  Weeks    Status  Partially Met    Target Date  02/10/20      PT LONG TERM GOAL #8   Title  Increase L side cervical rotation ROM to 60 degrees to be able to check blind spots when driving.    Baseline  40 degrees with pain (09/14/2019); 58 with pain (11/18/2019);    Time  12    Period  Weeks    Status  Partially Met    Target Date  02/10/20            Plan - 01/26/20 1638    Clinical Impression Statement  Patient tolerated treatment well overall and was very tender to palpation at right glute region in all areas where manual therapy STM was performed. Patient felt stretch at anterior hip with PA joint mobilizations in neutral and ER and stretch of affected region with IR sustained manual stretch. Patient continues to be limited by hip pain that is most aggravated with running activities. Neck and arm pain is less pronounced today but continues to limit pt in her desired activity. Patient would benefit from continued management of limiting condition by skilled physical therapist to address remaining impairments and functional limitations to work towards stated goals and return to PLOF or maximal functional independence.  Personal Factors and Comorbidities  Age;Education;Behavior Pattern;Social Background;Fitness;Past/Current Experience;Comorbidity 1    Comorbidities  history of R hip pain; chronic L shoulder pain    Examination-Activity Limitations  Bend;Caring for Others;Carry;Lift;Transfers;Squat;Sit;Dressing;Stand;Bed Mobility;Reach Overhead    running, lifting weights   Examination-Participation Restrictions  Other;Community Activity;Interpersonal Relationship;Cleaning;Driving   work, farm Mudlogger, fitness activities   Stability/Clinical Decision Making  Evolving/Moderate complexity    Rehab Potential  Good    PT Frequency  Other (comment)   up to 2 times per week as needed   PT Duration  12 weeks    PT Treatment/Interventions  ADLs/Self Care Home Management;Cryotherapy;Electrical Stimulation;Moist Heat;Therapeutic activities;Therapeutic exercise;Balance training;Neuromuscular re-education;Patient/family education;Manual techniques;Dry needling;Passive range of motion;Spinal Manipulations;Joint Manipulations;Traction    PT Next Visit Plan  continue core, glute, and functional progression as tolerated. continue progressing cervical spine/shoulder strengthening and specific exercise as tolerated    PT Home Exercise Plan  Medbridge: Lumbar spine Access Code: [email or web address]; cervical spine Access Code: [email or web address]; nerve floss Access Code: [email or web address]    Consulted and Agree with Plan of Care  Patient       Patient will benefit from skilled therapeutic intervention in order to improve the following deficits and impairments:  Decreased strength, Impaired flexibility, Increased muscle spasms, Pain, Decreased activity tolerance, Decreased endurance, Decreased mobility, Difficulty walking, Impaired perceived functional ability, Increased fascial restricitons, Decreased range of motion, Impaired UE functional use, Postural dysfunction, Hypermobility, Improper body mechanics  Visit Diagnosis: Cervicalgia  Acute left-sided low back pain without sciatica  Chronic left shoulder pain  Paresthesia of skin  Pain in right hip  Muscle weakness (generalized)     Problem List Patient Active Problem List   Diagnosis Date Noted  . Acute left-sided low back pain 07/19/2019  . Rash/skin eruption 07/16/2019  . Preventative health care 04/16/2018     Everlean Alstrom. Graylon Good, PT, DPT 01/26/20, 4:40 PM  [facility] [address] [place], [address] Phone: [phone]   Fax:  [phone]  Name: Lori Mcgee MRN: [id number] Date of Birth: Jul 26, 1986

## 2020-02-01 ENCOUNTER — Ambulatory Visit: Payer: No Typology Code available for payment source | Admitting: Physical Therapy

## 2020-02-03 ENCOUNTER — Other Ambulatory Visit: Payer: Self-pay

## 2020-02-03 ENCOUNTER — Ambulatory Visit: Payer: No Typology Code available for payment source | Attending: Family Medicine

## 2020-02-03 DIAGNOSIS — M6281 Muscle weakness (generalized): Secondary | ICD-10-CM | POA: Diagnosis present

## 2020-02-03 DIAGNOSIS — M25551 Pain in right hip: Secondary | ICD-10-CM | POA: Diagnosis not present

## 2020-02-03 NOTE — Therapy (Signed)
[facility] [address], [place], [address] Phone: [phone]   Fax:  [phone]  Physical Therapy Treatment  Patient Details  Name: Lori Mcgee MRN: [id number] Date of Birth: 08-Jun-1986 Referring Provider (PT): Tower, Wynelle Fanny, MD   Encounter Date: 02/03/2020  PT End of Session - 02/03/20 0905    Visit Number  27    Number of Visits  40    Date for PT Re-Evaluation  02/10/20    Authorization Type  [employer] reporting period from 12/14/2019    Authorization - Visit Number  7    Authorization - Number of Visits  10    PT Start Time  0815    PT Stop Time  0900    PT Time Calculation (min)  45 min    Activity Tolerance  Patient tolerated treatment well;Patient limited by pain    Behavior During Therapy  [facility] for tasks assessed/performed       Past Medical History:  Diagnosis Date  . BV (bacterial vaginosis)     Past Surgical History:  Procedure Laterality Date  . HYSTEROSCOPY N/A 10/31/2016   Procedure: HYSTEROSCOPY WITH IUD REMOVAL;  Surgeon: Rubie Maid, MD;  Location: [facility] ORS;  Service: Gynecology;  Laterality: N/A;  . iud extraction  10/31/2016  . WISDOM TOOTH EXTRACTION      There were no vitals filed for this visit.  Subjective Assessment - 02/03/20 0839    Subjective  Patient reports increased lateral hip pain. States she has been performing her exercises which she states are getting easier and has been adding sets to further challenge. Patient states she has been continuing to have the pain especially after driving 6 hours.    Pertinent History  Patient is a 34 y.o. female who presents to outpatient physical therapy with a referral for medical diagnosis acute left-sided low back pain without sciatica. This patient's chief complaints consist of pain in the L glute and lumbar spine causing difficulty with motions involving lumbopelvic region leading to the following functional  deficits: difficulty sitting, bending, lifting, twisting, transferring, prolonged standing, bed mobility. Patient reports sudden onset while stepping backwards 07/18/2019 and no prior history of this type of problem. Relevant past medical history and comorbidities include patient was recently treated for poison ivy that caused a rash over the left flank taking a 15 day taper of prednisone that ended yesterday. History of R hip pain earlier this year that has resolved.    Limitations  Sitting;Other (comment);House hold activities;Lifting    Diagnostic tests  none    Patient Stated Goals  resolve pain and return to prior level of activity    Currently in Pain?  Yes    Pain Score  1     Pain Location  Hip    Pain Orientation  Right    Pain Descriptors / Indicators  Aching    Pain Type  Chronic pain    Pain Onset  More than a month ago    Pain Frequency  Intermittent         TREATMENT Therapeutic Exercise Hip abduction with sidelying -- 3 x 12 Hip ER sidelying with foot placed knee -- 2 x 12 Single leg squats with UE support in door threshold -- 3 x 10 Single leg mini squat holding --  2 x 10 Self mobilization at wall with LAX ball -- 3 min  Manual therapy Ischemic compression along the lateral gluteal musculature to  improve pain and spasms especially with going up hill while running with patient positioned in sidelying -- x 32mn During manual therapy Dry needling performed to the glute med on the R side to decrease pain and spasms along the affected area with patient in sidelying. Patient educated on the potential risks and benefits to treatment and verbally consents to treatment.     PT Education - 02/03/20 0904    Education Details  form/technique with exercise; extreme clamshells    Person(s) Educated  Patient    Methods  Explanation;Demonstration;Handout    Comprehension  Verbalized understanding;Returned demonstration       PT Short Term Goals - 11/18/19 1916      PT SHORT  TERM GOAL #1   Title  Be independent with initial home exercise program for self-management of symptoms.    Baseline  initial HEP provided at IE (07/21/2019); updated HEP (09/14/2019)    Time  2    Period  Weeks    Status  Achieved    Target Date  09/28/19        PT Long Term Goals - 12/07/19 1919      PT LONG TERM GOAL #1   Title  Demonstrate improved FOTO score to equal or greater than 87 to demonstrate improvement in overall condition and self-reported functional ability.    Baseline  FOTO = 63 (07/21/2019); FOTO = 83 (08/31/2019); FOTO = 72 (11/18/2019);    Time  12    Period  Weeks    Status  Partially Met    Target Date  02/10/20      PT LONG TERM GOAL #2   Title  Have full lumbar AROM with no compensations or increase in pain in all planes except intermittent end range discomfort to allow patient to complete valued activities with less difficulty.    Baseline  painful, see objective exam (07/21/2019); No pain with lumbar extension (08/31/2019)    Time  6    Period  Weeks    Status  Achieved    Target Date  10/26/19      PT LONG TERM GOAL #3   Title  Bilateral hip extension strength will be equal or greater than 4+/5 and pain free to improve ability to return to ADLs and fitness activities without difficulty.    Baseline  R hip extension initially at 3+/5, worst with knee extended (07/21/2019); 5/5 bilaterally hip extension (08/31/2019; 11/18/2019);    Time  6    Period  Weeks    Status  Achieved    Target Date  10/26/19      PT LONG TERM GOAL #4   Title  Complete community, work and/or recreational activities without limitation due to current condition.    Baseline  currently unable to run or work out as usual, limiting lifting, etc (07/21/2019); able to workout without pain but haven't trial running yet.(08/31/2019); have been able to return to short and light running but having difficulty managing graded return and lacks stability in improvement and is not yet back to prior 4  days a week unrestricted; also is limited in reaching, turning head, driving, caring for children (09/14/2019); has returned to running but is limited by how many miles she can run including recent flair up, has improved ability to use L arm and move head but still getting pain with end range head turning (11/18/2019); limited in running and overhead lifting (12/07/2019):    Time  12    Period  Weeks  Status  Partially Met    Target Date  02/10/20      PT LONG TERM GOAL #5   Title  Be independent with a long-term home exercise program for self-management of symptoms.    Baseline  initial HEP provided at [facility] (07/21/2019); continue updating 08/31/2019; cervical spine added 09/14/2019); continues to participate appropriately but has not yet reached discharge (11/18/2019, 12/07/2019);    Time  12    Period  Weeks    Status  Partially Met    Target Date  02/10/20      PT LONG TERM GOAL #6   Title  Patient will able to complete graded running program (HEP) for 45 minutes without pain to ensure a safe return to running.    Baseline  Started 10 mins running program (08/31/2019); Reports being able to run up to 6.5 miles comfortably until last saturday when she had lasting pain exacerbation of 8/10 that is down to 4/10 following 8 mile run (11/18/2019);    Time  12    Period  Weeks    Status  Partially Met    Target Date  02/10/20      PT LONG TERM GOAL #7   Title  Reduce L shoulder and neck max pain with functional activities to equal or less than 1/10 to allow patient to complete usual activities including ADLs, IADLs, and social engagement with less difficulty.    Baseline  7-8/10 max pain (09/14/2019); max pain in last 2 weeks 5/10 but only one episode, otherwise up to 1-2/10 (11/18/2019);    Time  12    Period  Weeks    Status  Partially Met    Target Date  02/10/20      PT LONG TERM GOAL #8   Title  Increase L side cervical rotation ROM to 60 degrees to be able to check blind spots when driving.     Baseline  40 degrees with pain (09/14/2019); 58 with pain (11/18/2019);    Time  12    Period  Weeks    Status  Partially Met    Target Date  02/10/20            Plan - 02/03/20 0905    Clinical Impression Statement  Demonstrates increased pain with performance of exercises targetting the glute med, lateral hip stabilizer musculature, and deep hip external rotators. Targetting these muscles during the session today. Increased pain with performance which subsides after performing manual techniques over the affeted area. Demonstrated self mobilization to soft tissue with LAX ball and gave to patient to be added to HEP. Patient's pain onsets into later rep ranges indicating endurance limitations. Patient will benefit from further skilled therapy to return to prior level of function.    Personal Factors and Comorbidities  Age;Education;Behavior Pattern;Social Background;Fitness;Past/Current Experience;Comorbidity 1    Comorbidities  history of R hip pain; chronic L shoulder pain    Examination-Activity Limitations  Bend;Caring for Others;Carry;Lift;Transfers;Squat;Sit;Dressing;Stand;Bed Mobility;Reach Overhead   running, lifting weights   Examination-Participation Restrictions  Other;Community Activity;Interpersonal Relationship;Cleaning;Driving   work, farm Mudlogger, fitness activities   Stability/Clinical Decision Making  Evolving/Moderate complexity    Rehab Potential  Good    PT Frequency  Other (comment)   up to 2 times per week as needed   PT Duration  12 weeks    PT Treatment/Interventions  ADLs/Self Care Home Management;Cryotherapy;Electrical Stimulation;Moist Heat;Therapeutic activities;Therapeutic exercise;Balance training;Neuromuscular re-education;Patient/family education;Manual techniques;Dry needling;Passive range of motion;Spinal Manipulations;Joint Manipulations;Traction    PT Next Visit Plan  continue core, glute, and functional progression as tolerated. continue  progressing cervical spine/shoulder strengthening and specific exercise as tolerated    PT Home Exercise Plan  Medbridge: Lumbar spine Access Code: [email or web address]; cervical spine Access Code: [email or web address]; nerve floss Access Code: [email or web address]    Consulted and Agree with Plan of Care  Patient       Patient will benefit from skilled therapeutic intervention in order to improve the following deficits and impairments:  Decreased strength, Impaired flexibility, Increased muscle spasms, Pain, Decreased activity tolerance, Decreased endurance, Decreased mobility, Difficulty walking, Impaired perceived functional ability, Increased fascial restricitons, Decreased range of motion, Impaired UE functional use, Postural dysfunction, Hypermobility, Improper body mechanics  Visit Diagnosis: Pain in right hip  Muscle weakness (generalized)     Problem List Patient Active Problem List   Diagnosis Date Noted  . Acute left-sided low back pain 07/19/2019  . Rash/skin eruption 07/16/2019  . Preventative health care 04/16/2018    Blythe Stanford, PT DPT 02/03/2020, 9:15 AM  [facility] [address] [place], [address] Phone: [phone]   Fax:  [phone]  Name: Lori Mcgee MRN: [id number] Date of Birth: 06-09-1986

## 2020-03-27 ENCOUNTER — Ambulatory Visit (INDEPENDENT_AMBULATORY_CARE_PROVIDER_SITE_OTHER): Payer: No Typology Code available for payment source | Admitting: Certified Nurse Midwife

## 2020-03-27 ENCOUNTER — Encounter: Payer: Self-pay | Admitting: Certified Nurse Midwife

## 2020-03-27 ENCOUNTER — Other Ambulatory Visit: Payer: Self-pay

## 2020-03-27 VITALS — BP 127/80 | HR 80 | Ht 68.75 in | Wt 186.3 lb

## 2020-03-27 DIAGNOSIS — L290 Pruritus ani: Secondary | ICD-10-CM | POA: Diagnosis not present

## 2020-03-27 DIAGNOSIS — R635 Abnormal weight gain: Secondary | ICD-10-CM

## 2020-03-27 DIAGNOSIS — E7800 Pure hypercholesterolemia, unspecified: Secondary | ICD-10-CM

## 2020-03-27 DIAGNOSIS — Z01419 Encounter for gynecological examination (general) (routine) without abnormal findings: Secondary | ICD-10-CM | POA: Diagnosis not present

## 2020-03-27 DIAGNOSIS — Z6827 Body mass index (BMI) 27.0-27.9, adult: Secondary | ICD-10-CM

## 2020-03-27 NOTE — Progress Notes (Signed)
Pt present for annual exam. Pt c/o rectum itching. No other issues.

## 2020-03-27 NOTE — Progress Notes (Signed)
ANNUAL PREVENTATIVE CARE GYN  ENCOUNTER NOTE  Subjective:       Lori Mcgee is a 34 y.o. [id number] female here for a routine annual gynecologic exam.  Current complaints: 1.  Rectal itching  Reports intermittent rectal itching that started months ago, intense for a few days then resolves. No relief with home treatment measures.   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  Denies difficulty breathing or respiratory distress, chest pain, abdominal pain, excessive vaginal bleeding, dysuria, leg pain or swelling.    Gynecologic History  Patient's last menstrual period was 03/16/2020.   Contraception: coitus interruptus   Last Pap: 05/29/17. Results were: Neg/Neg  Last mammogram: 12/06/19. Results were: BI-RADS 2: Benign. Recommended follow up for screening mammogram at age 51  Obstetric History  OB History  Gravida Para Term Preterm AB Living  3 3 3  0 0 3  SAB TAB Ectopic Multiple Live Births  0 0 0   3    # Outcome Date GA Lbr Len/2nd Weight Sex Delivery Anes PTL Lv  3 Term 11/23/13   7 lb 11 oz (3.487 kg) M Vag-Spont  N LIV  2 Term 11/01/11   6 lb 1 oz (2.75 kg) F Vag-Spont  N LIV  1 Term 02/03/09   8 lb 8 oz (3.856 kg) M Vag-Spont  N LIV    Past Medical History:  Diagnosis Date  . BV (bacterial vaginosis)     Past Surgical History:  Procedure Laterality Date  . HYSTEROSCOPY N/A 10/31/2016   Procedure: HYSTEROSCOPY WITH IUD REMOVAL;  Surgeon: 11/02/2016, MD;  Location: [facility] ORS;  Service: Gynecology;  Laterality: N/A;  . iud extraction  10/31/2016  . WISDOM TOOTH EXTRACTION      No current outpatient medications on file prior to visit.   No current facility-administered medications on file prior to visit.    No Known Allergies  Social History   Socioeconomic History  . Marital status: Married    Spouse name: Not on file  . Number of children: Not on file  . Years of education: Not on file  . Highest education level: Not on file   Occupational History  . Not on file  Tobacco Use  . Smoking status: Never Smoker  . Smokeless tobacco: Never Used  Substance and Sexual Activity  . Alcohol use: No  . Drug use: No  . Sexual activity: Yes    Birth control/protection: None  Other Topics Concern  . Not on file  Social History Narrative   Married.   3 children.   Works in 11/02/2016.    Enjoys exercising and playing basketball.     Social Determinants of Health   Financial Resource Strain:   . Difficulty of Paying Living Expenses:   Food Insecurity:   . Worried About [employer] in the Last Year:   . Programme researcher, broadcasting/film/video in the Last Year:   Transportation Needs:   . Barista (Medical):   Freight forwarder Lack of Transportation (Non-Medical):   Physical Activity:   . Days of Exercise per Week:   . Minutes of Exercise per Session:   Stress:   . Feeling of Stress :   Social Connections:   . Frequency of Communication with Friends and Family:   . Frequency of Social Gatherings with Friends and Family:   . Attends Religious Services:   . Active Member of Clubs or Organizations:   . Attends  Club or Organization Meetings:   Marland Kitchen Marital Status:   Intimate Partner Violence:   . Fear of Current or Ex-Partner:   . Emotionally Abused:   Marland Kitchen Physically Abused:   . Sexually Abused:     Family History  Problem Relation Age of Onset  . Diabetes Father   . Hypertension Father   . Hypothyroidism Father   . Hypertension Mother   . Breast cancer Paternal Aunt        late 57's or early 25's    The following portions of the patient's history were reviewed and updated as appropriate: allergies, current medications, past family history, past medical history, past social history, past surgical history and problem list.  Review of Systems  ROS- negative except as noted above. Information obtained from patient.   Objective:   BP 127/80   Pulse 80   Ht 5' 8.75" (1.746 m)   Wt 186 lb 4.8 oz (84.5  kg)   LMP 03/16/2020   BMI 27.71 kg/m    CONSTITUTIONAL: Well-developed, well-nourished female in no acute distress.   PSYCHIATRIC: Normal mood and affect. Normal behavior. Normal judgment and thought content.  [place]oni: Alert and oriented to person, place, and time. Normal muscle tone coordination. No cranial nerve deficit noted.  HENT:  Normocephalic, atraumatic, External right and left ear normal. Oropharynx is clear and moist  EYES: Conjunctivae and EOM are normal. No scleral icterus.   NECK: Normal range of motion, supple, no masses.  Normal thyroid.   SKIN: Skin is warm and dry. No rash noted. Not diaphoretic. No erythema. No pallor.  CARDIOVASCULAR: Normal heart rate noted, regular rhythm, no murmur.  RESPIRATORY: Clear to auscultation bilaterally. Effort and breath sounds normal, no problems with respiration noted.  BREASTS: Symmetric in size. No masses, skin changes, nipple drainage, or lymphadenopathy.  ABDOMEN: Soft, normal bowel sounds, no distention noted.  No tenderness, rebound or guarding.   PELVIC:  External Genitalia: Normal  Vagina: Normal  Uterus: Normal  Adnexa: Normal  RV: External Exam NormaI and No Rectal Masses   MUSCULOSKELETAL: Normal range of motion. No tenderness.  No cyanosis, clubbing, or edema.  2+ distal pulses.  LYMPHATIC: No Axillary, Supraclavicular, or Inguinal Adenopathy.  Assessment:   Annual gynecologic examination 34 y.o.   Contraception: coitus interruptus   Overweight   Problem List Items Addressed This Visit    None    Visit Diagnoses    Well woman exam with routine gynecological exam    -  Primary   Relevant Orders   CBC   Comprehensive metabolic panel   TSH   Lipid panel   Rectal itching       Weight gain       Relevant Orders   TSH   Lipid panel   Elevated LDL cholesterol level       Relevant Orders   Lipid panel   BMI 27.0-27.9,adult       Relevant Orders   Lipid panel      Plan:   Pap: Not needed    Mammogram: Not Indicated   Labs: See Orders.  Recommended Hydrocortisone 1%, or tucks pads for anal itching.   Routine preventative health maintenance measures emphasized: Exercise/Diet/Weight control, Tobacco Warnings, Alcohol/Substance use risks, Stress Management and Safe Sex. See AVS  Reviewed red flags and when to call the office.   RTC x 1 year for ANNUAL EXAM or sooner if needed.   Fransico Him RN [place] 03/27/20 8:57 AM

## 2020-03-27 NOTE — Patient Instructions (Addendum)
Preventive Care 21-35 Years Old, Female Preventive care refers to visits with your health care provider and lifestyle choices that can promote health and wellness. This includes:  A yearly physical exam. This may also be called an annual well check.  Regular dental visits and eye exams.  Immunizations.  Screening for certain conditions.  Healthy lifestyle choices, such as eating a healthy diet, getting regular exercise, not using drugs or products that contain nicotine and tobacco, and limiting alcohol use. What can I expect for my preventive care visit? Physical exam Your health care provider will check your:  Height and weight. This may be used to calculate body mass index (BMI), which tells if you are at a healthy weight.  Heart rate and blood pressure.  Skin for abnormal spots. Counseling Your health care provider may ask you questions about your:  Alcohol, tobacco, and drug use.  Emotional well-being.  Home and relationship well-being.  Sexual activity.  Eating habits.  Work and work environment.  Method of birth control.  Menstrual cycle.  Pregnancy history. What immunizations do I need?  Influenza (flu) vaccine  This is recommended every year. Tetanus, diphtheria, and pertussis (Tdap) vaccine  You may need a Td booster every 10 years. Varicella (chickenpox) vaccine  You may need this if you have not been vaccinated. Human papillomavirus (HPV) vaccine  If recommended by your health care provider, you may need three doses over 6 months. Measles, mumps, and rubella (MMR) vaccine  You may need at least one dose of MMR. You may also need a second dose. Meningococcal conjugate (MenACWY) vaccine  One dose is recommended if you are age 19-21 years and a first-year college student living in a residence hall, or if you have one of several medical conditions. You may also need additional booster doses. Pneumococcal conjugate (PCV13) vaccine  You may need  this if you have certain conditions and were not previously vaccinated. Pneumococcal polysaccharide (PPSV23) vaccine  You may need one or two doses if you smoke cigarettes or if you have certain conditions. Hepatitis A vaccine  You may need this if you have certain conditions or if you travel or work in places where you may be exposed to hepatitis A. Hepatitis B vaccine  You may need this if you have certain conditions or if you travel or work in places where you may be exposed to hepatitis B. Haemophilus influenzae type b (Hib) vaccine  You may need this if you have certain conditions. You may receive vaccines as individual doses or as more than one vaccine together in one shot (combination vaccines). Talk with your health care provider about the risks and benefits of combination vaccines. What tests do I need?  Blood tests  Lipid and cholesterol levels. These may be checked every 5 years starting at age 20.  Hepatitis C test.  Hepatitis B test. Screening  Diabetes screening. This is done by checking your blood sugar (glucose) after you have not eaten for a while (fasting).  Sexually transmitted disease (STD) testing.  BRCA-related cancer screening. This may be done if you have a family history of breast, ovarian, tubal, or peritoneal cancers.  Pelvic exam and Pap test. This may be done every 3 years starting at age 21. Starting at age 30, this may be done every 5 years if you have a Pap test in combination with an HPV test. Talk with your health care provider about your test results, treatment options, and if necessary, the need for more tests.   Follow these instructions at home: Eating and drinking   Eat a diet that includes fresh fruits and vegetables, whole grains, lean protein, and low-fat dairy.  Take vitamin and mineral supplements as recommended by your health care provider.  Do not drink alcohol if: ? Your health care provider tells you not to drink. ? You are  pregnant, may be pregnant, or are planning to become pregnant.  If you drink alcohol: ? Limit how much you have to 0-1 drink a day. ? Be aware of how much alcohol is in your drink. In the [place] one drink equals one 12 oz bottle of beer (355 mL), one 5 oz glass of wine (148 mL), or one 1 oz glass of hard liquor (44 mL). Lifestyle  Take daily care of your teeth and gums.  Stay active. Exercise for at least 30 minutes on 5 or more days each week.  Do not use any products that contain nicotine or tobacco, such as cigarettes, e-cigarettes, and chewing tobacco. If you need help quitting, ask your health care provider.  If you are sexually active, practice safe sex. Use a condom or other form of birth control (contraception) in order to prevent pregnancy and STIs (sexually transmitted infections). If you plan to become pregnant, see your health care provider for a preconception visit. What's next?  Visit your health care provider once a year for a well check visit.  Ask your health care provider how often you should have your eyes and teeth checked.  Stay up to date on all vaccines. This information is not intended to replace advice given to you by your health care provider. Make sure you discuss any questions you have with your health care provider. Document Revised: 07/02/2018 Document Reviewed: 07/02/2018 Elsevier Patient Education  2020 [place] Breast self-awareness is knowing how your breasts look and feel. Doing breast self-awareness is important. It allows you to catch a breast problem early while it is still small and can be treated. All women should do breast self-awareness, including women who have had breast implants. Tell your doctor if you notice a change in your breasts. What you need:  A mirror.  A well-lit room. How to do a breast self-exam A breast self-exam is one way to learn what is normal for your breasts and to check for changes. To do a  breast self-exam: Look for changes  1. Take off all the clothes above your waist. 2. Stand in front of a mirror in a room with good lighting. 3. Put your hands on your hips. 4. Push your hands down. 5. Look at your breasts and nipples in the mirror to see if one breast or nipple looks different from the other. Check to see if: ? The shape of one breast is different. ? The size of one breast is different. ? There are wrinkles, dips, and bumps in one breast and not the other. 6. Look at each breast for changes in the skin, such as: ? Redness. ? Scaly areas. 7. Look for changes in your nipples, such as: ? Liquid around the nipples. ? Bleeding. ? Dimpling. ? Redness. ? A change in where the nipples are. Feel for changes  1. Lie on your back on the floor. 2. Feel each breast. To do this, follow these steps: ? Pick a breast to feel. ? Put the arm closest to that breast above your head. ? Use your other arm to feel the nipple area of your breast.  Feel the area with the pads of your three middle fingers by making small circles with your fingers. For the first circle, press lightly. For the second circle, press harder. For the third circle, press even harder. ? Keep making circles with your fingers at the different pressures as you move down your breast. Stop when you feel your ribs. ? Move your fingers a little toward the center of your body. ? Start making circles with your fingers again, this time going up until you reach your collarbone. ? Keep making up-and-down circles until you reach your armpit. Remember to keep using the three pressures. ? Feel the other breast in the same way. 3. Sit or stand in the tub or shower. 4. With soapy water on your skin, feel each breast the same way you did in step 2 when you were lying on the floor. Write down what you find Writing down what you find can help you remember what to tell your doctor. Write down:  What is normal for each breast.  Any  changes you find in each breast, including: ? The kind of changes you find. ? Whether you have pain. ? Size and location of any lumps.  When you last had your menstrual period. General tips  Check your breasts every month.  If you are breastfeeding, the best time to check your breasts is after you feed your baby or after you use a breast pump.  If you get menstrual periods, the best time to check your breasts is 5-7 days after your menstrual period is over.  With time, you will become comfortable with the self-exam, and you will begin to know if there are changes in your breasts. Contact a doctor if you:  See a change in the shape or size of your breasts or nipples.  See a change in the skin of your breast or nipples, such as red or scaly skin.  Have fluid coming from your nipples that is not normal.  Find a lump or thick area that was not there before.  Have pain in your breasts.  Have any concerns about your breast health. Summary  Breast self-awareness includes looking for changes in your breasts, as well as feeling for changes within your breasts.  Breast self-awareness should be done in front of a mirror in a well-lit room.  You should check your breasts every month. If you get menstrual periods, the best time to check your breasts is 5-7 days after your menstrual period is over.  Let your doctor know of any changes you see in your breasts, including changes in size, changes on the skin, pain or tenderness, or fluid from your nipples that is not normal. This information is not intended to replace advice given to you by your health care provider. Make sure you discuss any questions you have with your health care provider. Document Revised: 06/09/2018 Document Reviewed: 06/09/2018 Elsevier Patient Education  [place].

## 2020-03-27 NOTE — Progress Notes (Signed)
I have seen, interviewed, and examined the patient in conjunction with the [employer] Nurse Practitioner student and affirm the diagnosis and management plan.   Gunnar Bulla, CNM [facility], [facility] 03/27/20 10:29 AM

## 2020-03-28 LAB — CBC
Hematocrit: 42.5 % (ref 34.0–46.6)
Hemoglobin: 13.9 g/dL (ref 11.1–15.9)
MCH: 30.1 pg (ref 26.6–33.0)
MCHC: 32.7 g/dL (ref 31.5–35.7)
MCV: 92 fL (ref 79–97)
Platelets: 334 10*3/uL (ref 150–450)
RBC: 4.62 x10E6/uL (ref 3.77–5.28)
RDW: 12.1 % (ref 11.7–15.4)
WBC: 6.5 10*3/uL (ref 3.4–10.8)

## 2020-03-28 LAB — LIPID PANEL
Chol/HDL Ratio: 3.9 ratio (ref 0.0–4.4)
Cholesterol, Total: 185 mg/dL (ref 100–199)
HDL: 47 mg/dL (ref >39)
LDL Chol Calc (NIH): 112 mg/dL — ABNORMAL HIGH (ref 0–99)
Triglycerides: 146 mg/dL (ref 0–149)
VLDL Cholesterol Cal: 26 mg/dL (ref 5–40)

## 2020-03-28 LAB — COMPREHENSIVE METABOLIC PANEL
ALT: 10 IU/L (ref 0–32)
AST: 13 IU/L (ref 0–40)
Albumin/Globulin Ratio: 1.5 (ref 1.2–2.2)
Albumin: 4.6 g/dL (ref 3.8–4.8)
Alkaline Phosphatase: 78 IU/L (ref 48–121)
BUN/Creatinine Ratio: 15 (ref 9–23)
BUN: 13 mg/dL (ref 6–20)
Bilirubin Total: 0.5 mg/dL (ref 0.0–1.2)
CO2: 25 mmol/L (ref 20–29)
Calcium: 9.2 mg/dL (ref 8.7–10.2)
Chloride: 101 mmol/L (ref 96–106)
Creatinine, Ser: 0.87 mg/dL (ref 0.57–1.00)
GFR calc Af Amer: 101 mL/min/{1.73_m2} (ref >59)
GFR calc non Af Amer: 87 mL/min/{1.73_m2} (ref >59)
Globulin, Total: 3 g/dL (ref 1.5–4.5)
Glucose: 76 mg/dL (ref 65–99)
Potassium: 4 mmol/L (ref 3.5–5.2)
Sodium: 139 mmol/L (ref 134–144)
Total Protein: 7.6 g/dL (ref 6.0–8.5)

## 2020-03-28 LAB — TSH: TSH: 1.32 u[IU]/mL (ref 0.450–4.500)

## 2020-05-30 ENCOUNTER — Ambulatory Visit (INDEPENDENT_AMBULATORY_CARE_PROVIDER_SITE_OTHER): Payer: No Typology Code available for payment source | Admitting: Primary Care

## 2020-05-30 ENCOUNTER — Other Ambulatory Visit: Payer: Self-pay

## 2020-05-30 ENCOUNTER — Ambulatory Visit (INDEPENDENT_AMBULATORY_CARE_PROVIDER_SITE_OTHER)
Admission: RE | Admit: 2020-05-30 | Discharge: 2020-05-30 | Disposition: A | Discharge status: Home / Self Care | Payer: No Typology Code available for payment source | Source: Ambulatory Visit | Attending: Primary Care | Admitting: Primary Care

## 2020-05-30 ENCOUNTER — Encounter: Payer: Self-pay | Admitting: Primary Care

## 2020-05-30 DIAGNOSIS — R109 Unspecified abdominal pain: Secondary | ICD-10-CM | POA: Insufficient documentation

## 2020-05-30 DIAGNOSIS — R1012 Left upper quadrant pain: Secondary | ICD-10-CM

## 2020-05-30 LAB — CBC WITH DIFFERENTIAL/PLATELET
Basophils Absolute: 0.1 10*3/uL (ref 0.0–0.1)
Basophils Relative: 1.3 % (ref 0.0–3.0)
Eosinophils Absolute: 0.1 10*3/uL (ref 0.0–0.7)
Eosinophils Relative: 1.4 % (ref 0.0–5.0)
HCT: 39.1 % (ref 36.0–46.0)
Hemoglobin: 13.1 g/dL (ref 12.0–15.0)
Lymphocytes Relative: 41.1 % (ref 12.0–46.0)
Lymphs Abs: 2.5 10*3/uL (ref 0.7–4.0)
MCHC: 33.6 g/dL (ref 30.0–36.0)
MCV: 92 fl (ref 78.0–100.0)
Monocytes Absolute: 0.6 10*3/uL (ref 0.1–1.0)
Monocytes Relative: 9.4 % (ref 3.0–12.0)
Neutro Abs: 2.8 10*3/uL (ref 1.4–7.7)
Neutrophils Relative %: 46.8 % (ref 43.0–77.0)
Platelets: 331 10*3/uL (ref 150.0–400.0)
RBC: 4.25 Mil/uL (ref 3.87–5.11)
RDW: 13 % (ref 11.5–15.5)
WBC: 6.1 10*3/uL (ref 4.0–10.5)

## 2020-05-30 LAB — LIPASE: Lipase: 15 U/L (ref 11.0–59.0)

## 2020-05-30 NOTE — Progress Notes (Signed)
Subjective:    Patient ID: Lori Mcgee, female    DOB: 11-18-1985, 34 y.o.   MRN: [id number]  HPI  This visit occurred during the SARS-CoV-2 public health emergency.  Safety protocols were in place, including screening questions prior to the visit, additional usage of staff PPE, and extensive cleaning of exam room while observing appropriate contact time as indicated for disinfecting solutions.   Lori Mcgee is a 34 year old female who presents today with a chief complaint of abdominal pain.   Her pain is located to the LUQ with radiation through her back for which she describes as a constant, dull, achy pain with intermittent sharp pain. Her pain began about one week ago, is constant mostly, sometimes worse when eating. She doesn't notice her pain when sleeping or with exercise.   Also with bloating, decreased frequency of bowel movements with last bowel movement being yesterday.   She denies changes in diet, medications, nausea, vomiting, bloody stools, urinary symptoms. She's not taken anything OTC.   BP Readings from Last 3 Encounters:  05/30/20 116/82  03/27/20 127/80  07/19/19 112/74     Review of Systems  Constitutional: Negative for fever.  Gastrointestinal: Positive for abdominal pain and constipation. Negative for blood in stool, diarrhea, nausea and vomiting.  Genitourinary: Negative for frequency and pelvic pain.       Past Medical History:  Diagnosis Date  . BV (bacterial vaginosis)      Social History   Socioeconomic History  . Marital status: Married    Spouse name: Not on file  . Number of children: Not on file  . Years of education: Not on file  . Highest education level: Not on file  Occupational History  . Not on file  Tobacco Use  . Smoking status: Never Smoker  . Smokeless tobacco: Never Used  Vaping Use  . Vaping Use: Never used  Substance and Sexual Activity  . Alcohol use: No  . Drug use: No  . Sexual activity: Yes    Birth  control/protection: None  Other Topics Concern  . Not on file  Social History Narrative   Married.   3 children.   Works in [employer].    Enjoys exercising and playing basketball.     Social Determinants of Health   Financial Resource Strain:   . Difficulty of Paying Living Expenses:   Food Insecurity:   . Worried About Programme researcher, broadcasting/film/video in the Last Year:   . Barista in the Last Year:   Transportation Needs:   . Freight forwarder (Medical):   Marland Kitchen Lack of Transportation (Non-Medical):   Physical Activity:   . Days of Exercise per Week:   . Minutes of Exercise per Session:   Stress:   . Feeling of Stress :   Social Connections:   . Frequency of Communication with Friends and Family:   . Frequency of Social Gatherings with Friends and Family:   . Attends Religious Services:   . Active Member of Clubs or Organizations:   . Attends Banker Meetings:   Marland Kitchen Marital Status:   Intimate Partner Violence:   . Fear of Current or Ex-Partner:   . Emotionally Abused:   Marland Kitchen Physically Abused:   . Sexually Abused:     Past Surgical History:  Procedure Laterality Date  . HYSTEROSCOPY N/A 10/31/2016   Procedure: HYSTEROSCOPY WITH IUD REMOVAL;  Surgeon: Hildred Laser, MD;  Location: [facility] ORS;  Service:  Gynecology;  Laterality: N/A;  . iud extraction  10/31/2016  . WISDOM TOOTH EXTRACTION      Family History  Problem Relation Age of Onset  . Diabetes Father   . Hypertension Father   . Hypothyroidism Father   . Hypertension Mother   . Breast cancer Paternal Aunt        late 30's or early 40's    No Known Allergies  No current outpatient medications on file prior to visit.   No current facility-administered medications on file prior to visit.    BP 116/82   Pulse 65   Temp (!) 96.1 F (35.6 C) (Temporal)   Ht 5' 8.75" (1.746 m)   Wt 188 lb 8 oz (85.5 kg)   LMP 05/21/2020   SpO2 97%   BMI 28.04 kg/m     Objective:   Physical  Exam Constitutional:      General: She is not in acute distress.    Appearance: She is not ill-appearing.  Pulmonary:     Effort: Pulmonary effort is normal.  Abdominal:     General: Abdomen is flat. Bowel sounds are normal.     Palpations: Abdomen is soft.     Tenderness: There is no abdominal tenderness. There is no guarding.  Neurological:     Mental Status: She is alert.            Assessment & Plan:

## 2020-05-30 NOTE — Addendum Note (Signed)
Addended by: Alvina Chou on: 05/30/2020 10:42 AM   Modules accepted: Orders

## 2020-05-30 NOTE — Assessment & Plan Note (Addendum)
Acute for the last week, sometimes worse with eating. She doesn't appear sickly today, abdomen non tender on exam.  Differentials include constipation, H pylori infection, ulcer.   Checking labs today including CBC with diff, Lipase, H pylori stool. Consider Carafate Rx. Consider PPI but will need to wait after collection of H pylori stool. Await results.

## 2020-05-30 NOTE — Patient Instructions (Signed)
Complete xray(s) and labs prior to leaving today. I will notify you of your results once received.  Please return the stool specimen as soon as possible.  I'll be in touch later today.  It was a pleasure to see you today!

## 2020-06-01 ENCOUNTER — Other Ambulatory Visit: Payer: No Typology Code available for payment source

## 2020-06-01 DIAGNOSIS — R1012 Left upper quadrant pain: Secondary | ICD-10-CM

## 2020-06-02 LAB — HELICOBACTER PYLORI  SPECIAL ANTIGEN
MICRO NUMBER:: 10765256
SPECIMEN QUALITY: ADEQUATE

## 2020-10-11 ENCOUNTER — Other Ambulatory Visit (HOSPITAL_COMMUNITY)
Admission: RE | Admit: 2020-10-11 | Discharge: 2020-10-11 | Disposition: A | Discharge status: Home / Self Care | Payer: No Typology Code available for payment source | Source: Ambulatory Visit | Attending: Certified Nurse Midwife | Admitting: Certified Nurse Midwife

## 2020-10-11 ENCOUNTER — Ambulatory Visit (INDEPENDENT_AMBULATORY_CARE_PROVIDER_SITE_OTHER): Payer: No Typology Code available for payment source | Admitting: Certified Nurse Midwife

## 2020-10-11 ENCOUNTER — Encounter: Payer: Self-pay | Admitting: Certified Nurse Midwife

## 2020-10-11 ENCOUNTER — Other Ambulatory Visit: Payer: Self-pay

## 2020-10-11 ENCOUNTER — Other Ambulatory Visit: Payer: Self-pay | Admitting: Certified Nurse Midwife

## 2020-10-11 VITALS — BP 112/57 | HR 82 | Ht 69.5 in | Wt 186.4 lb

## 2020-10-11 DIAGNOSIS — N898 Other specified noninflammatory disorders of vagina: Secondary | ICD-10-CM | POA: Diagnosis not present

## 2020-10-11 MED ORDER — FLUCONAZOLE 150 MG PO TABS: 150.0000 mg | ORAL_TABLET | Freq: Once | ORAL | 0 refills | Status: AC

## 2020-10-11 MED ORDER — CLOBETASOL PROPIONATE 0.05 % EX OINT: 1.0000 "application " | TOPICAL_OINTMENT | Freq: Two times a day (BID) | CUTANEOUS | 1 refills | Status: AC

## 2020-10-11 NOTE — Progress Notes (Signed)
GYN ENCOUNTER NOTE  Subjective:       Lori Mcgee is a 34 y.o. G61P3003 female is here for gynecologic evaluation of the following issues:  1.vaginal and perineal itching    . Pt states that she has been dealing with perineal itching since June. She notes recently increased white discharge. That the perineal/rectal  itching has not been as noticeable because the vaginal itching is now intense.   Gynecologic History Patient's last menstrual period was 09/25/2020 (approximate). Contraception: none Last Pap: 05/30/2017. Results were: normal Last mammogram: n/a    Obstetric History OB History  Gravida Para Term Preterm AB Living  3 3 3  0 0 3  SAB TAB Ectopic Multiple Live Births  0 0 0   3    # Outcome Date GA Lbr Len/2nd Weight Sex Delivery Anes PTL Lv  3 Term 11/23/13   7 lb 11 oz (3.487 kg) M Vag-Spont  N LIV  2 Term 11/01/11   6 lb 1 oz (2.75 kg) F Vag-Spont  N LIV  1 Term 02/03/09   8 lb 8 oz (3.856 kg) M Vag-Spont  N LIV    Past Medical History:  Diagnosis Date  . BV (bacterial vaginosis)     Past Surgical History:  Procedure Laterality Date  . HYSTEROSCOPY N/A 10/31/2016   Procedure: HYSTEROSCOPY WITH IUD REMOVAL;  Surgeon: 11/02/2016, MD;  Location: [facility] ORS;  Service: Gynecology;  Laterality: N/A;  . iud extraction  10/31/2016  . WISDOM TOOTH EXTRACTION      No current outpatient medications on file prior to visit.   No current facility-administered medications on file prior to visit.    No Known Allergies  Social History   Socioeconomic History  . Marital status: Married    Spouse name: Not on file  . Number of children: Not on file  . Years of education: Not on file  . Highest education level: Not on file  Occupational History  . Not on file  Tobacco Use  . Smoking status: Never Smoker  . Smokeless tobacco: Never Used  Vaping Use  . Vaping Use: Never used  Substance and Sexual Activity  . Alcohol use: No  . Drug use: No  . Sexual activity:  Yes    Birth control/protection: None  Other Topics Concern  . Not on file  Social History Narrative   Married.   3 children.   Works in 11/02/2016.    Enjoys exercising and playing basketball.     Social Determinants of Health   Financial Resource Strain:   . Difficulty of Paying Living Expenses: Not on file  Food Insecurity:   . Worried About [employer] in the Last Year: Not on file  . Ran Out of Food in the Last Year: Not on file  Transportation Needs:   . Lack of Transportation (Medical): Not on file  . Lack of Transportation (Non-Medical): Not on file  Physical Activity:   . Days of Exercise per Week: Not on file  . Minutes of Exercise per Session: Not on file  Stress:   . Feeling of Stress : Not on file  Social Connections:   . Frequency of Communication with Friends and Family: Not on file  . Frequency of Social Gatherings with Friends and Family: Not on file  . Attends Religious Services: Not on file  . Active Member of Clubs or Organizations: Not on file  . Attends Programme researcher, broadcasting/film/video Meetings: Not on file  .  Marital Status: Not on file  Intimate Partner Violence:   . Fear of Current or Ex-Partner: Not on file  . Emotionally Abused: Not on file  . Physically Abused: Not on file  . Sexually Abused: Not on file    Family History  Problem Relation Age of Onset  . Diabetes Father   . Hypertension Father   . Hypothyroidism Father   . Hypertension Mother   . Breast cancer Paternal Aunt        late 17's or early 50's    The following portions of the patient's history were reviewed and updated as appropriate: allergies, current medications, past family history, past medical history, past social history, past surgical history and problem list.  Review of Systems Review of Systems - Negative except as mentioned in HPI Review of Systems - General ROS: negative for - chills, fatigue, fever, hot flashes, malaise or night sweats Hematological  and Lymphatic ROS: negative for - bleeding problems or swollen lymph nodes Gastrointestinal ROS: negative for - abdominal pain, blood in stools, change in bowel habits and nausea/vomiting Musculoskeletal ROS: negative for - joint pain, muscle pain or muscular weakness Genito-Urinary ROS: negative for - change in menstrual cycle, dysmenorrhea, dyspareunia, dysuria, genital discharge, genital ulcers, hematuria, incontinence, irregular/heavy menses, nocturia or pelvic painjj  Objective:   BP (!) 112/57   Pulse 82   Ht 5' 9.5" (1.765 m)   Wt 186 lb 6 oz (84.5 kg)   LMP 09/25/2020 (Approximate)   BMI 27.13 kg/m  CONSTITUTIONAL: Well-developed, well-nourished female in no acute distress.  HENT:  Normocephalic, atraumatic.  NECK: Normal range of motion, supple, no masses.  Normal thyroid.  SKIN: Skin is warm and dry. No rash noted. Not diaphoretic. No erythema. No pallor. NEUROLGIC: Alert and oriented to person, place, and time. PSYCHIATRIC: Normal mood and affect. Normal behavior. Normal judgment and thought content. CARDIOVASCULAR:Not Examined RESPIRATORY: Not Examined BREASTS: Not Examined ABDOMEN: Soft, non distended; Non tender.  No Organomegaly. PELVIC:  External Genitalia: Normal  BUS: Normal  Vagina: Normal  Cervix: Normal, white particulate discharge, probable yeast   Perinal exam: Scar tissue noted  RV: Normal , no hemorrhoids noted on internal exam.  Bladder: Nontender MUSCULOSKELETAL: Normal range of motion. No tenderness.  No cyanosis, clubbing, or edema.     Assessment:   Vaginal itching Perineal itching Rectal itching   Plan:   Vaginal swab collected. Will follow up with results. Discussed use of monistat topically to labia/perinal /rectal area due to probable yeast. Discussed scar tissue being a cause of itching. Encouraged sitz bath prn. Once active infections treated, clobetasol cream ordered for perineal itching. She verbalizes and agrees to plan. Will follow  up with results.  Doreene Burke, CNM

## 2020-10-11 NOTE — Patient Instructions (Signed)
Vaginitis Vaginitis is a condition in which the vaginal tissue swells and becomes red (inflamed). This condition is most often caused by a change in the normal balance of bacteria and yeast that live in the vagina. This change causes an overgrowth of certain bacteria or yeast, which causes the inflammation. There are different types of vaginitis, but the most common types are:  Bacterial vaginosis.  Yeast infection (candidiasis).  Trichomoniasis vaginitis. This is a sexually transmitted disease (STD).  Viral vaginitis.  Atrophic vaginitis.  Allergic vaginitis. What are the causes? The cause of this condition depends on the type of vaginitis. It can be caused by:  Bacteria (bacterial vaginosis).  Yeast, which is a fungus (yeast infection).  A parasite (trichomoniasis vaginitis).  A virus (viral vaginitis).  Low hormone levels (atrophic vaginitis). Low hormone levels can occur during pregnancy, breastfeeding, or after menopause.  Irritants, such as bubble baths, scented tampons, and feminine sprays (allergic vaginitis). Other factors can change the normal balance of the yeast and bacteria that live in the vagina. These include:  Antibiotic medicines.  Poor hygiene.  Diaphragms, vaginal sponges, spermicides, birth control pills, and intrauterine devices (IUD).  Sex.  Infection.  Uncontrolled diabetes.  A weakened defense (immune) system. What increases the risk? This condition is more likely to develop in women who:  Smoke.  Use vaginal douches, scented tampons, or scented sanitary pads.  Wear tight-fitting pants.  Wear thong underwear.  Use oral birth control pills or an IUD.  Have sex without a condom.  Have multiple sex partners.  Have an STD.  Frequently use the spermicide nonoxynol-9.  Eat lots of foods high in sugar.  Have uncontrolled diabetes.  Have low estrogen levels.  Have a weakened immune system from an immune disorder or medical  treatment.  Are pregnant or breastfeeding. What are the signs or symptoms? Symptoms vary depending on the cause of the vaginitis. Common symptoms include:  Abnormal vaginal discharge. ? The discharge is white, gray, or yellow with bacterial vaginosis. ? The discharge is thick, white, and cheesy with a yeast infection. ? The discharge is frothy and yellow or greenish with trichomoniasis.  A bad vaginal smell. The smell is fishy with bacterial vaginosis.  Vaginal itching, pain, or swelling.  Sex that is painful.  Pain or burning when urinating. Sometimes there are no symptoms. How is this diagnosed? This condition is diagnosed based on your symptoms and medical history. A physical exam, including a pelvic exam, will also be done. You may also have other tests, including:  Tests to determine the pH level (acidity or alkalinity) of your vagina.  A whiff test, to assess the odor that results when a sample of your vaginal discharge is mixed with a potassium hydroxide solution.  Tests of vaginal fluid. A sample will be examined under a microscope. How is this treated? Treatment varies depending on the type of vaginitis you have. Your treatment may include:  Antibiotic creams or pills to treat bacterial vaginosis and trichomoniasis.  Antifungal medicines, such as vaginal creams or suppositories, to treat a yeast infection.  Medicine to ease discomfort if you have viral vaginitis. Your sexual partner should also be treated.  Estrogen delivered in a cream, pill, suppository, or vaginal ring to treat atrophic vaginitis. If vaginal dryness occurs, lubricants and moisturizing creams may help. You may need to avoid scented soaps, sprays, or douches.  Stopping use of a product that is causing allergic vaginitis. Then using a vaginal cream to treat the symptoms. Follow   these instructions at home: Lifestyle  Keep your genital area clean and dry. Avoid soap, and only rinse the area with  water.  Do not douche or use tampons until your health care provider says it is okay to do so. Use sanitary pads, if needed.  Do not have sex until your health care provider approves. When you can return to sex, practice safe sex and use condoms.  Wipe from front to back. This avoids the spread of bacteria from the rectum to the vagina. General instructions  Take over-the-counter and prescription medicines only as told by your health care provider.  If you were prescribed an antibiotic medicine, take or use it as told by your health care provider. Do not stop taking or using the antibiotic even if you start to feel better.  Keep all follow-up visits as told by your health care provider. This is important. How is this prevented?  Use mild, non-scented products. Do not use things that can irritate the vagina, such as fabric softeners. Avoid the following products if they are scented: ? Feminine sprays. ? Detergents. ? Tampons. ? Feminine hygiene products. ? Soaps or bubble baths.  Let air reach your genital area. ? Wear cotton underwear to reduce moisture buildup. ? Avoid wearing underwear while you sleep. ? Avoid wearing tight pants and underwear or nylons without a cotton panel. ? Avoid wearing thong underwear.  Take off any wet clothing, such as bathing suits, as soon as possible.  Practice safe sex and use condoms. Contact a health care provider if:  You have abdominal pain.  You have a fever.  You have symptoms that last for more than 2-3 days. Get help right away if:  You have a fever and your symptoms suddenly get worse. Summary  Vaginitis is a condition in which the vaginal tissue becomes inflamed.This condition is most often caused by a change in the normal balance of bacteria and yeast that live in the vagina.  Treatment varies depending on the type of vaginitis you have.  Do not douche, use tampons , or have sex until your health care provider approves. When  you can return to sex, practice safe sex and use condoms. This information is not intended to replace advice given to you by your health care provider. Make sure you discuss any questions you have with your health care provider. Document Revised: 10/03/2017 Document Reviewed: 11/26/2016 Elsevier Patient Education  2020 [employer]  

## 2020-10-12 LAB — CERVICOVAGINAL ANCILLARY ONLY
Bacterial Vaginitis (gardnerella): NEGATIVE
Candida Glabrata: NEGATIVE
Candida Vaginitis: POSITIVE — AB
Chlamydia: NEGATIVE
Comment: NEGATIVE
Comment: NEGATIVE
Comment: NEGATIVE
Comment: NEGATIVE
Comment: NEGATIVE
Comment: NORMAL
Neisseria Gonorrhea: NEGATIVE
Trichomonas: NEGATIVE

## 2020-10-15 ENCOUNTER — Other Ambulatory Visit: Payer: Self-pay | Admitting: Certified Nurse Midwife

## 2020-10-15 MED ORDER — FLUCONAZOLE 150 MG PO TABS: 150.0000 mg | ORAL_TABLET | Freq: Once | ORAL | 1 refills | Status: AC

## 2020-10-26 ENCOUNTER — Encounter: Payer: Self-pay | Admitting: Primary Care

## 2020-10-26 ENCOUNTER — Other Ambulatory Visit: Payer: Self-pay

## 2020-10-26 ENCOUNTER — Telehealth (INDEPENDENT_AMBULATORY_CARE_PROVIDER_SITE_OTHER): Payer: No Typology Code available for payment source | Admitting: Primary Care

## 2020-10-26 VITALS — Ht 69.5 in | Wt 186.0 lb

## 2020-10-26 DIAGNOSIS — M25551 Pain in right hip: Secondary | ICD-10-CM

## 2020-10-26 NOTE — Progress Notes (Signed)
Subjective:    Patient ID: Lori Mcgee, female    DOB: August 29, 1986, 34 y.o.   MRN: [id number]  HPI  Virtual Visit via Video Note  I connected with Lori Mcgee on 10/26/20 at  8:00 AM EST by a video enabled telemedicine application and verified that I am speaking with the correct person using two identifiers.  Location: Patient: Home Provider: Office Participants: Patient and myself   I discussed the limitations of evaluation and management by telemedicine and the availability of in person appointments. The patient expressed understanding and agreed to proceed.  History of Present Illness:  Lori Mcgee is a 34 year old female with a history of neck pain and low back pain who presents today requesting physical therapy for right sided hip pain.  She was evaluated by Dr. Milinda Antis in September 2020 for acute left sided lower back pain, exam was [facility] in nature, she was referred to physical therapy at the time. She was actually diagnosed with a "locking up of the muscle fibers" within the gluts. She underwent dry needling and PT at [facility] with improvement and resolve in symptoms.   Two weeks ago she went on two long runs (two different days) without stretching prior to her runs, and since then has experienced a nagging pain to the right hip and gluteal region. This is the same pain she experienced last year. She's tried home exercises but these are ineffective. She would like to resume physical therapy and dry needling as this was very effective in the past.   She can still complete her day to day tasks, but does notice the discomfort. She denies numbness/tingling to extremities, loss of bowel/bladder control.   Observations/Objective:  Alert and oriented. No distress. Speaking in complete sentences.   Assessment and Plan:  Acute right hip and gluteal symptoms, same as last year. Agree to refer to PT for evaluation and dry needling. Discussed importance of stretching prior to  exercise. Referral placed.  Follow Up Instructions:  You will be contacted regarding your referral to physical therapy.  Please let us know if you have not been contacted within two weeks.   It was a pleasure to see you today! Mayra Reel, NP-C    I discussed the assessment and treatment plan with the patient. The patient was provided an opportunity to ask questions and all were answered. The patient agreed with the plan and demonstrated an understanding of the instructions.   The patient was advised to call back or seek an in-person evaluation if the symptoms worsen or if the condition fails to improve as anticipated.    Doreene Nest, NP    Review of Systems  Musculoskeletal: Positive for myalgias.  Skin: Negative for color change.  Neurological: Negative for numbness.       Past Medical History:  Diagnosis Date  . BV (bacterial vaginosis)      Social History   Socioeconomic History  . Marital status: Married    Spouse name: Not on file  . Number of children: Not on file  . Years of education: Not on file  . Highest education level: Not on file  Occupational History  . Not on file  Tobacco Use  . Smoking status: Never Smoker  . Smokeless tobacco: Never Used  Vaping Use  . Vaping Use: Never used  Substance and Sexual Activity  . Alcohol use: No  . Drug use: No  . Sexual activity: Yes    Birth control/protection: None  Other Topics Concern  . Not on file  Social History Narrative   Married.   3 children.   Works in [employer].    Enjoys exercising and playing basketball.     Social Determinants of Health   Financial Resource Strain: Not on file  Food Insecurity: Not on file  Transportation Needs: Not on file  Physical Activity: Not on file  Stress: Not on file  Social Connections: Not on file  Intimate Partner Violence: Not on file    Past Surgical History:  Procedure Laterality Date  . HYSTEROSCOPY N/A 10/31/2016    Procedure: HYSTEROSCOPY WITH IUD REMOVAL;  Surgeon: Hildred Laser, MD;  Location: [facility] ORS;  Service: Gynecology;  Laterality: N/A;  . iud extraction  10/31/2016  . WISDOM TOOTH EXTRACTION      Family History  Problem Relation Age of Onset  . Diabetes Father   . Hypertension Father   . Hypothyroidism Father   . Hypertension Mother   . Breast cancer Paternal Aunt        late 30's or early 40's    No Known Allergies  No current outpatient medications on file prior to visit.   No current facility-administered medications on file prior to visit.    Ht 5' 9.5" (1.765 m)   Wt 186 lb (84.4 kg)   BMI 27.07 kg/m    Objective:   Physical Exam Constitutional:      General: She is not in acute distress. Pulmonary:     Effort: Pulmonary effort is normal.  Neurological:     Mental Status: She is alert and oriented to person, place, and time.  Psychiatric:        Mood and Affect: Mood normal.            Assessment & Plan:

## 2020-10-26 NOTE — Assessment & Plan Note (Signed)
Acute right hip and gluteal symptoms, same as last year. Agree to refer to PT for evaluation and dry needling. Discussed importance of stretching prior to exercise. Referral placed.

## 2020-10-26 NOTE — Patient Instructions (Signed)
You will be contacted regarding your referral to physical therapy.  Please let us know if you have not been contacted within two weeks.   It was a pleasure to see you today! Mayra Reel, NP-C

## 2020-11-07 ENCOUNTER — Encounter: Payer: Self-pay | Admitting: Physical Therapy

## 2020-11-07 ENCOUNTER — Other Ambulatory Visit: Payer: Self-pay

## 2020-11-07 ENCOUNTER — Ambulatory Visit: Payer: No Typology Code available for payment source | Attending: Primary Care | Admitting: Physical Therapy

## 2020-11-07 DIAGNOSIS — M6281 Muscle weakness (generalized): Secondary | ICD-10-CM | POA: Diagnosis present

## 2020-11-07 DIAGNOSIS — M25551 Pain in right hip: Secondary | ICD-10-CM | POA: Insufficient documentation

## 2020-11-07 NOTE — Therapy (Signed)
[facility] [address] [place], [address] Phone: [phone]   Fax:  [phone]  Physical Therapy Evaluation  Patient Details  Name: Lori Mcgee MRN: [id number] Date of Birth: 1986/09/12 Referring Provider (PT): Doreene Nest, NP   Encounter Date: 11/07/2020   PT End of Session - 11/07/20 1954    Visit Number 1    Number of Visits 24   as needed   Date for PT Re-Evaluation 01/30/21    Authorization Type [place] FOCUS reporting period from 11/07/2020    Progress Note Due on Visit 10    PT Start Time 1800    PT Stop Time 1845    PT Time Calculation (min) 45 min    Activity Tolerance Patient tolerated treatment well    Behavior During Therapy [facility] for tasks assessed/performed           Past Medical History:  Diagnosis Date  . BV (bacterial vaginosis)     Past Surgical History:  Procedure Laterality Date  . HYSTEROSCOPY N/A 10/31/2016   Procedure: HYSTEROSCOPY WITH IUD REMOVAL;  Surgeon: Hildred Laser, MD;  Location: [facility] ORS;  Service: Gynecology;  Laterality: N/A;  . iud extraction  10/31/2016  . WISDOM TOOTH EXTRACTION      There were no vitals filed for this visit.    Subjective Assessment - 11/07/20 1817    Subjective Patient is known to this clinic and PT. She is returning for right hip pain after it resolved last year with PT and dry needling. She has been running and working out without a problem but then close to December 20th she did a long 6 mile run without stretching, then sprints on the TM the next day, and then awoke with familiar pain at the right hip near the glute med that radiated into her thigh some. She started doing her previous HEP and it has been getting better but it still feels tight. She had to take time off running but has returned to running and has a nagging ache there. She ran today for 2 miles. No updates in her medical history. Denies numbness or tingling.  Denies back pain. Reports popping in both hips frequently.    Pertinent History Patient is a 35 y.o. female who presents to outpatient physical therapy with a referral for medical diagnosis acute pain of right hip. This patient's chief complaints consist of right hip pain near glute med leading to the following functional deficits: constant pain is distracting and it restricts her workouts and running activities, disrupts sleep and prolonged sitting, traveling, working. .  Relevant past medical history and comorbidities include history of back pain (episode as a youth and last year) and neck pain, R hip pain with two prior episodes in the past two years.    Limitations Sitting   constant pain is distracting and it restricts her workouts and running activities, disrupts sleep and prolonged sitting, traveling, working.   How long can you sit comfortably? 2 hours gets uncomfortable    Diagnostic tests none    Patient Stated Goals to get back to 0/10 pain    Currently in Pain? Yes    Pain Score 2    W: 8/10; B: 2/10   Pain Location Hip    Pain Orientation Right;Lateral;Posterior    Pain Descriptors / Indicators Aching   pinching   Pain Type Acute pain    Pain Radiating Towards right anterior thigh  Pain Onset 1 to 4 weeks ago    Pain Frequency Constant    Aggravating Factors  prolonged sitting, running, cycling, sleeping on the right side    Pain Relieving Factors stretching (especially figure 4), LAX ball self mobilization.    Effect of Pain on Daily Activities constant pain is distracting and it restricts her workouts and running activities, disrupts sleep and prolonged sitting, traveling, working.              [facility] PT Assessment - 11/08/20 1422      Assessment   Medical Diagnosis Acute pain of right hip    Referring Provider (PT) Doreene Nest, NP    Onset Date/Surgical Date 10/23/20    Prior Therapy yes with good improvement      Precautions   Precautions None       Restrictions   Weight Bearing Restrictions No      Balance Screen   Has the patient fallen in the past 6 months No    Has the patient had a decrease in activity level because of a fear of falling?  No    Is the patient reluctant to leave their home because of a fear of falling?  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 Still working with modifications.     Leisure likes to be active so she is miserable right now. Workout, run, [employer], play with kids. 2-3 miles a couple of times a week (has not tried running), peliton bike.  Household, taking care of the kids, 35, 73, 65 years old.       Cognition   Overall Cognitive Status Within Functional Limits for tasks assessed      Observation/Other Assessments   Focus on Therapeutic Outcomes (FOTO)  77           OBJECTIVE  OBSERVATION/INSPECTION Posture: forward head, rounded shoulders, slumped in sitting.  Marland Kitchen Posture: WFL for basic activity . Tremor: none . Muscle bulk: excellent muscle bulk as someone who exercises regularly.    . Bed mobility: supine <> sit and rolling WFL . Transfers: sit <> stand WFL . Gait: grossly WFL for household and short community ambulation. More detailed gait analysis deferred to later date as needed.   PERIPHERAL JOINT MOTION (in degrees) B LE PROM grossly equal and WFL at bilateral LE. More specifically at the hips [facility] with some end range concordant discomfort with R hip flexion overpressure, IR overpressure at 90 degrees flexion, and pleasant "stretch" feeling with ER overpressure at 90 degrees flexion.  MUSCLE PERFORMANCE (MMT):  B LE strength 5/5 bilaterally. Mild concordant discomfort with R hip flexion.   SPECIAL TESTS: Pain in R groin with FADDIR, left  negative Popping with dynamic FADDIR but no increased pain with pop. Negative bilaterally for FABER  PALPATION: - TTP with reproduction of concordant pain at R glute med.  EDUCATION/COGNITION: Patient is alert and oriented X 4.  Objective measurements completed on examination: See above findings.    TREATMENT:    Modality: (needlingunbilled, estim with constant attendance due to delivery through needles requiring presence of PT throughout treatment) Prone position: Dry needling performed along the right glute med muscle todecrease pain and spasms near the  right hip with  patient in sidelying. (1) .74mm x 2mm dry needles inserted (2) times along the right glute med with pistoning technique. Then (2) .33mm x 85mm dry needles inserted along the right glute med and used for estim as below. Estim applied: 5 min of4 milliA at ~6hz , and 5 min of73microAat~60 Hz. One channels along right glute med.  Mild muscle twitch noted with mA current.  Patient educated about the risks and benefits fromdry needlingtherapy and verbally consents to treatment.Patient draped appropriately.Dry needling performed bySara Henrietta Hoover, PT, DPT who is certified in this technique  Manual therapy: to reduce pain and tissue tension, improve range of motion, neuromodulation, in order to promote improved ability to complete functional activities. - STM to right glute med before and after dry needling to decrease pain and tension. Patient in sidelying.    Patient reported relief and got good twitch response with dry needling.    PT Education - 11/07/20 1954    Education Details Self management techniques. Education on diagnosis, prognosis, POC, anatomy and physiology of current condition Education on HEP. Dry needling risks and benefits.    Person(s) Educated Patient    Methods Explanation;Demonstration    Comprehension Verbalized understanding;Returned demonstration;Need further instruction            PT  Short Term Goals - 11/08/20 1425      PT SHORT TERM GOAL #1   Title Be independent with initial home exercise program for self-management of symptoms.    Baseline initial HEP provided at IE (11/07/2020);    Time 2    Period Weeks    Status New    Target Date 11/22/20             PT Long Term Goals - 11/08/20 1425      PT LONG TERM GOAL #1   Title Demonstrate improved FOTO score to equal or greater than 88 by visit # 11 to demonstrate improvement in overall condition and self-reported functional ability.    Baseline 77 (11/07/2020);    Time 12    Period Weeks    Status New   TARGET DATE FOR ALL LONG TERM GOALS: 01/31/2021     PT LONG TERM GOAL #2   Title Patient will be able to sleep through the night without disruption from R hip pain.    Baseline currently disrupts slee (11/07/2020);    Time 12    Period Weeks    Status New      PT LONG TERM GOAL #3   Title Patient will report equal or less than 1/10 pain with functional activities to all her to complete her usual activities such as running, sitting, with less difficulty.    Baseline up to 8/10 (11/07/2020);    Time 12    Period Weeks    Status New      PT LONG TERM GOAL #4   Title Complete community, work and/or recreational activities without limitation due to current condition.    Baseline Functional Limitations: constant pain is distracting and it restricts her workouts and running activities, disrupts sleep and prolonged sitting, traveling, working (11/08/2019);    Time 12    Period Weeks    Status New      PT LONG TERM GOAL #5   Title Be independent with a long-term home exercise program for self-management of symptoms.    Baseline initial HEP provided at IE (11/07/2020);    Time 12    Period Weeks  Status New                  Plan - 11/07/20 1959    Clinical Impression Statement Patient is a 35 y.o. female referred to outpatient physical therapy with a medical diagnosis of acute pain of right hip who  presents with signs and symptoms consistent with right hip pain with glute med irritation. Patient has normal strength on MMT, and has normal PROM except with feeling of tightness at the lateral right hip and glute region with some motions. Does have positive R FADDIR test and clicking in the R hip but she reports the clicking is normal for her without pain as well.  Patient presents with significant pain, activity tolerance, muscle power and endurance, muscle tenderness and tightness, neuromuscular coordination impairments that are limiting ability to complete her usual activities without difficulty. Patient will benefit from skilled physical therapy intervention to address current body structure impairments and activity limitations to improve function and work towards goals set in current POC in order to return to prior level of function or maximal functional improvement.    Personal Factors and Comorbidities Age;Fitness;Past/Current Experience;Comorbidity 2;Behavior Pattern    Comorbidities history of low back pain (episode as a youth and last year) and neck pain, R hip pain with two prior episodes in the past two years.    Examination-Activity Limitations Sleep;Sit;Squat;Stairs    Examination-Participation Restrictions Interpersonal Relationship;Driving;Community Activity;Occupation   constant pain is distracting and it restricts her workouts and running activities, disrupts sleep and prolonged sitting, traveling, working.   Stability/Clinical Decision Making Stable/Uncomplicated    Clinical Decision Making Low    Rehab Potential Good    PT Frequency Other (comment)   up to 2 times per week as needed   PT Duration 12 weeks    PT Treatment/Interventions ADLs/Self Care Home Management;Cryotherapy;Electrical Stimulation;Moist Heat;Therapeutic activities;Therapeutic exercise;Balance training;Neuromuscular re-education;Patient/family education;Manual techniques;Dry needling;Passive range of motion;Spinal  Manipulations;Joint Manipulations;Aquatic Therapy;Biofeedback;Traction    PT Next Visit Plan continue stretches, LAX ball self mobilization    PT Home Exercise Plan To be updated as appropriate at next session.    Consulted and Agree with Plan of Care Patient           Patient will benefit from skilled therapeutic intervention in order to improve the following deficits and impairments:  Increased muscle spasms,Pain,Decreased activity tolerance,Decreased endurance,Decreased mobility,Difficulty walking,Impaired perceived functional ability,Postural dysfunction,Hypermobility,Decreased coordination,Decreased balance  Visit Diagnosis: Pain in right hip     Problem List Patient Active Problem List   Diagnosis Date Noted  . Abdominal pain 05/30/2020  . Acute hip pain 07/19/2019  . Rash/skin eruption 07/16/2019  . Preventative health care 04/16/2018    Everlean Alstrom. Graylon Good, PT, DPT 11/08/20, 2:31 PM  [place] [facility] [address] [place], [address] Phone: [phone]   Fax:  [phone]  Name: Lori Mcgee MRN: [id number] Date of Birth: 04-Apr-1986

## 2020-11-09 ENCOUNTER — Encounter: Payer: Self-pay | Admitting: Physical Therapy

## 2020-11-09 ENCOUNTER — Other Ambulatory Visit: Payer: Self-pay

## 2020-11-09 ENCOUNTER — Ambulatory Visit: Payer: No Typology Code available for payment source | Admitting: Physical Therapy

## 2020-11-09 DIAGNOSIS — M25551 Pain in right hip: Secondary | ICD-10-CM

## 2020-11-09 NOTE — Therapy (Signed)
[facility] [address] [place], [address] Phone: [phone]   Fax:  [phone]  Physical Therapy Treatment  Patient Details  Name: Lori Mcgee MRN: [id number] Date of Birth: 09-14-1986 Referring Provider (PT): Doreene Nest, NP   Encounter Date: 11/09/2020   PT End of Session - 11/09/20 1856    Visit Number 2    Number of Visits 24   as needed   Date for PT Re-Evaluation 01/30/21    Authorization Type [place] FOCUS reporting period from 11/07/2020    Progress Note Due on Visit 10    PT Start Time 1800    PT Stop Time 1840    PT Time Calculation (min) 40 min    Activity Tolerance Patient tolerated treatment well    Behavior During Therapy [facility] for tasks assessed/performed           Past Medical History:  Diagnosis Date  . BV (bacterial vaginosis)     Past Surgical History:  Procedure Laterality Date  . HYSTEROSCOPY N/A 10/31/2016   Procedure: HYSTEROSCOPY WITH IUD REMOVAL;  Surgeon: Hildred Laser, MD;  Location: [facility] ORS;  Service: Gynecology;  Laterality: N/A;  . iud extraction  10/31/2016  . WISDOM TOOTH EXTRACTION      There were no vitals filed for this visit.   Subjective Assessment - 11/09/20 1802    Subjective Patient reports she is a little sore at the right hip. She felt good yesterday and did some sprints and felt okay. She ran 4 miles today and felt good until the last half mile. It felt super tight when she was stretching. Pain number is 2/10 currently. Felt that dry needling helped.    Pertinent History Patient is a 35 y.o. female who presents to outpatient physical therapy with a referral for medical diagnosis acute pain of right hip. This patient's chief complaints consist of right hip pain near glute med leading to the following functional deficits: constant pain is distracting and it restricts her workouts and running activities, disrupts sleep and prolonged sitting,  traveling, working. .  Relevant past medical history and comorbidities include history of back pain (episode as a youth and last year) and neck pain, R hip pain with two prior episodes in the past two years.    Limitations Sitting   constant pain is distracting and it restricts her workouts and running activities, disrupts sleep and prolonged sitting, traveling, working.   How long can you sit comfortably? 2 hours gets uncomfortable    Diagnostic tests none    Patient Stated Goals to get back to 0/10 pain    Currently in Pain? Yes    Pain Score 2     Pain Orientation Right    Pain Onset 1 to 4 weeks ago           TREATMENT:    Modality: (needlingunbilled, estim with constant attendance due to delivery through needles requiring presence of PT throughout treatment) Prone position: Dry needling performed along the right glute med muscle todecrease pain and spasms near the right hip with  patient in sidelying. (2) .26mm x needles inserted along the right glute med with pistoning technique before applying estim as below. Also inserted (1) .58mm x needle into another location along right glute med with pistoning technique.  Estim applied: 5 min of11milliA at ~6hz , and 5 min of2 microAat~60 Hz. One channel along right glute med.  Mild muscle twitch noted  with mA current.  Patient educated about the risks and benefits fromdry needlingtherapy and verbally consents to treatment.Patient draped appropriately.Dry needling performed bySara Henrietta Hoover, PT, DPT who is certified in this technique  Manual therapy: to reduce pain and tissue tension, improve range of motion, neuromodulation, in order to promote improved ability to complete functional activities. - STM to right glute med before and after dry needling to decrease pain and tension. Patient in sidelying.   Therapeutic exercise: to centralize symptoms and improve ROM, strength, muscular endurance, and activity tolerance  required for successful completion of functional activities.  - seated figure 4 stretch to R hip    PT Education - 11/09/20 1856    Education Details dry needling, exercise    Person(s) Educated Patient    Methods Explanation    Comprehension Verbalized understanding;Returned demonstration            PT Short Term Goals - 11/08/20 1425      PT SHORT TERM GOAL #1   Title Be independent with initial home exercise program for self-management of symptoms.    Baseline initial HEP provided at IE (11/07/2020);    Time 2    Period Weeks    Status New    Target Date 11/22/20             PT Long Term Goals - 11/08/20 1425      PT LONG TERM GOAL #1   Title Demonstrate improved FOTO score to equal or greater than 88 by visit # 11 to demonstrate improvement in overall condition and self-reported functional ability.    Baseline 77 (11/07/2020);    Time 12    Period Weeks    Status New   TARGET DATE FOR ALL LONG TERM GOALS: 01/30/2021     PT LONG TERM GOAL #2   Title Patient will be able to sleep through the night without disruption from R hip pain.    Baseline currently disrupts slee (11/07/2020);    Time 12    Period Weeks    Status New      PT LONG TERM GOAL #3   Title Patient will report equal or less than 1/10 pain with functional activities to all her to complete her usual activities such as running, sitting, with less difficulty.    Baseline up to 8/10 (11/07/2020);    Time 12    Period Weeks    Status New      PT LONG TERM GOAL #4   Title Complete community, work and/or recreational activities without limitation due to current condition.    Baseline Functional Limitations: constant pain is distracting and it restricts her workouts and running activities, disrupts sleep and prolonged sitting, traveling, working (11/08/2019);    Time 12    Period Weeks    Status New      PT LONG TERM GOAL #5   Title Be independent with a long-term home exercise program for self-management of  symptoms.    Baseline initial HEP provided at IE (11/07/2020);    Time 12    Period Weeks    Status New                 Plan - 11/09/20 1900    Clinical Impression Statement Patient tolerated treatment well and was very tender to palpation throughout the right glute med and min region. Good twitch response with dry needling. Tender and tight to manual therapy and applied stretch following both for decreased tightness. Patient with good  response to dry needling last session as well. Patient would benefit from continued management of limiting condition by skilled physical therapist to address remaining impairments and functional limitations to work towards stated goals and return to PLOF or maximal functional independence.    Personal Factors and Comorbidities Age;Fitness;Past/Current Experience;Comorbidity 2;Behavior Pattern    Comorbidities history of low back pain (episode as a youth and last year) and neck pain, R hip pain with two prior episodes in the past two years.    Examination-Activity Limitations Sleep;Sit;Squat;Stairs    Examination-Participation Restrictions Interpersonal Relationship;Driving;Community Activity;Occupation   constant pain is distracting and it restricts her workouts and running activities, disrupts sleep and prolonged sitting, traveling, working.   Stability/Clinical Decision Making Stable/Uncomplicated    Rehab Potential Good    PT Frequency Other (comment)   up to 2 times per week as needed   PT Duration 12 weeks    PT Treatment/Interventions ADLs/Self Care Home Management;Cryotherapy;Electrical Stimulation;Moist Heat;Therapeutic activities;Therapeutic exercise;Balance training;Neuromuscular re-education;Patient/family education;Manual techniques;Dry needling;Passive range of motion;Spinal Manipulations;Joint Manipulations;Aquatic Therapy;Biofeedback;Traction    PT Next Visit Plan continue stretches, LAX ball self mobilization    PT Home Exercise Plan To be  updated as appropriate at next session.    Consulted and Agree with Plan of Care Patient           Patient will benefit from skilled therapeutic intervention in order to improve the following deficits and impairments:  Increased muscle spasms,Pain,Decreased activity tolerance,Decreased endurance,Decreased mobility,Difficulty walking,Impaired perceived functional ability,Postural dysfunction,Hypermobility,Decreased coordination,Decreased balance  Visit Diagnosis: Pain in right hip     Problem List Patient Active Problem List   Diagnosis Date Noted  . Abdominal pain 05/30/2020  . Acute hip pain 07/19/2019  . Rash/skin eruption 07/16/2019  . Preventative health care 04/16/2018    Everlean Alstrom. Graylon Good, PT, DPT 11/09/20, 7:02 PM  [place] [facility] [address] [place], [address] Phone: [phone]   Fax:  [phone]  Name: Lori Mcgee MRN: [id number] Date of Birth: 07-Dec-1985

## 2020-11-13 ENCOUNTER — Telehealth: Payer: Self-pay

## 2020-11-13 NOTE — Telephone Encounter (Signed)
Contacted pt who reports she has had trouble sleeping, meaning 2 and 3 days ago she had problems falling asleep, one night ago she couldn't stay asleep and last night she slept fine. She reports she "just feels off."  Pt is c/o dizziness and nausea for 3 days and HA today. Pt denies any other symptoms and no covid exposures. Pt is immunized. 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 While on the phone her father came over with his glucometer and checked her BG and it was 234 and she was very concerned. 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 Pt still denied any other symptoms. She reports she had not eaten since 2pm and she only had lentils and spinach and only drinks water and has had nothing else to eat or drink. Pt seemed quite concerned about the BG so this nurse suggested if she was concerned that she could check it again in about 30-60 minutes and could also go to an UC to be assessed if she developed any other symptoms. Advised a msg would be sent to her PCP and this office would f/u with PCP suggestion tomorrow. Advised if any symptoms worsen or she develops any new symptoms to contact this office. Advised of ER precautions. Pt verbalized understanding.

## 2020-11-13 NOTE — Telephone Encounter (Signed)
[facility] Day - Client TELEPHONE ADVICE RECORD Access[employer]urse Patient Name: Lori Mcgee Gender: Female DOB: 1985/12/05 Age: 35 Y 18 D Return Phone Number: [phone] (Primary), [phone] (Secondary) Address: City/State/Zip: [place] [address] Client [facility] Day - Client Client Site [facility] - Day Physician Lori Mcgee - NP Contact Type Call Who Is Calling Patient / Member / Family / Caregiver Call Type Triage / Clinical Relationship To Patient Self Return Phone Number [phone] (Secondary) Chief Complaint Dizziness Reason for Call Symptomatic / Request for Health Information Initial Comment Caller states she is currently experiencing trouble sleeping, fatigue, dizziness and nausea for the last couple of days. Translation No Nurse Assessment Nurse: Lori Hacker, RN, Lori Mcgee Date/Time Lori Mcgee Time): 11/13/2020 3:11:27 PM Confirm and document reason for call. If symptomatic, describe symptoms. ---Caller states she is currently experiencing trouble sleeping, fatigue, dizziness and nausea for the last couple of days. Advised that she has a nauseated feeling at dinner time. No temp. No vomiting. No other symptoms. Does the patient have any new or worsening symptoms? ---Yes Will a triage be completed? ---Yes Related visit to physician within the last 2 weeks? ---No Does the PT have any chronic conditions? (i.e. diabetes, asthma, this includes High risk factors for pregnancy, etc.) ---No Is the patient pregnant or possibly pregnant? (Ask all females between the ages of 11-55) ---Yes How many weeks gestation? ---unknown What is the estimated delivery date? ---0001-01-01 Total number of pregnancies including current? ---0 Number of live births? ---3 Have you felt decreased fetal movement? ---Early Pregnancy - No Fetal Movement Felt Yet Is this a behavioral health or substance abuse call?  ---No Guidelines Guideline Title Affirmed Question Affirmed Notes Nurse Date/Time (Eastern Time) Nausea Nausea lasts > 1 week Deaton, RN, Lori Mcgee 11/13/2020 3:13:31 PM PLEASE NOTE: All timestamps contained within this report are represented as [place] Standard Time. CONFIDENTIALTY NOTICE: This fax transmission is intended only for the addressee. It contains information that is legally privileged, confidential or otherwise protected from use or disclosure. If you are not the intended recipient, you are strictly prohibited from reviewing, disclosing, copying using or disseminating any of this information or taking any action in reliance on or regarding this information. If you have received this fax in error, please notify us immediately by telephone so that we can arrange for its return to [place]. Phone: [phone], Toll-Free: [phone], Fax: [phone] Page: 2 of 2 Call Id: [id number] Guidelines Guideline Title Affirmed Question Affirmed Notes Nurse Date/Time Lori Mcgee Time) Dizziness - Lightheadedness [1] MILD dizziness (e.g., walking normally) AND [2] has NOT been evaluated by physician for this (Exception: dizziness caused by heat exposure, sudden standing, or poor fluid intake) Deaton, RN, Lori Mcgee 11/13/2020 3:16:03 PM Disp. Time Lori Mcgee Time) Disposition Final User 11/13/2020 3:15:46 PM SEE PCP WITHIN 3 DAYS Deaton, RN, Lori Mcgee 11/13/2020 3:17:49 PM SEE PCP WITHIN 3 DAYS Yes Deaton, RN, Lori Mcgee Disagree/Comply Comply Caller Understands Yes PreDisposition Did not know what to do Care Advice Given Per Guideline SEE PCP WITHIN 3 DAYS: * You need to be seen within 2 or 3 days. CLEAR FLUIDS: * Take clear fluids in small amounts until the nausea is resolved for 8 hours: * Sip water or rehydration liquid (Gatorade or Powerade) * Other options: 1/2 strength flat lemonlime soda or ginger ale. AVOID MEDS: * Avoid NSAIDs, which can cause gastritis * Stop taking all non-prescription  medicines. (Reason: may make nausea worse.) PREGNANCY TEST, WHEN IN DOUBT: * If there  is a chance that you might be pregnant, use a urine pregnancy test. * You can buy a pregnancy test at the drugstore. * It works best if you test your first urine in the morning. * Call back if you are pregnant. * Follow the instructions included in the package. CALL BACK IF: * You become worse CARE ADVICE given per Nausea (Adult) guideline. SEE PCP WITHIN 3 DAYS: * You need to be seen within 2 or 3 days. CARE ADVICE given per Dizziness (Adult) guideline. DRINK FLUIDS: * Drink several glasses of fruit juice, other clear fluids or water. * This will improve hydration and blood glucose. * If the weather is hot or you have a fever, make sure the fluids are cold. LIE DOWN AND REST: * Lie down with feet elevated for 1 hour. * This will improve circulation and increase blood flow to the brain. CALL BACK IF: * Passes out (faints) * You become worse Comments User: Lori Scot, RN Date/Time (Eastern Time): 11/13/2020 3:19:31 PM Caller advised that she is not sure if she is pregnant, has not did a pregnancy test. Advised that she had a period around Christmas. Transferred caller to the office to make and appointment. Referrals REFERRED TO PCP OFFICE

## 2020-11-14 NOTE — Telephone Encounter (Signed)
Joellen, will you please call and check on patient? I am happy to see her this week, we can squeeze her in.

## 2020-11-15 NOTE — Telephone Encounter (Signed)
Called and left voicemail for patient to return call to office.  °

## 2020-11-16 ENCOUNTER — Ambulatory Visit: Payer: No Typology Code available for payment source | Admitting: Physical Therapy

## 2020-11-16 NOTE — Telephone Encounter (Signed)
Called patient is feeling much better would like to still have follow up with Lori Mcgee in office. I have made for Monday when she is back in town. Will call If any changes.

## 2020-11-20 ENCOUNTER — Ambulatory Visit: Payer: No Typology Code available for payment source | Admitting: Primary Care

## 2020-11-22 ENCOUNTER — Ambulatory Visit: Payer: No Typology Code available for payment source

## 2020-11-22 ENCOUNTER — Other Ambulatory Visit: Payer: Self-pay

## 2020-11-22 DIAGNOSIS — M6281 Muscle weakness (generalized): Secondary | ICD-10-CM

## 2020-11-22 DIAGNOSIS — M25551 Pain in right hip: Secondary | ICD-10-CM

## 2020-11-22 NOTE — Therapy (Signed)
[facility] MEDICINE [address], [place], [address] Phone: [phone]   Fax:  [phone]  Physical Therapy Treatment  Patient Details  Name: Lori Mcgee MRN: [id number] Date of Birth: [birth year] Referring Provider (PT): Doreene Nest, NP   Encounter Date: 11/22/2020   PT End of Session - 11/22/20 1810    Visit Number 3    Number of Visits 24    Date for PT Re-Evaluation 01/30/21    Authorization Type [place] [employer] reporting period from 11/07/2020    Authorization Time Period Current cert period: 07/21/2019 - 10/14/2019    PT Start Time 1500    Activity Tolerance Patient tolerated treatment well;No increased pain    Behavior During Therapy WFL for tasks assessed/performed           Past Medical History:  Diagnosis Date  . BV (bacterial vaginosis)     Past Surgical History:  Procedure Laterality Date  . HYSTEROSCOPY N/A 10/31/2016   Procedure: HYSTEROSCOPY WITH IUD REMOVAL;  Surgeon: Hildred Laser, MD;  Location: [facility] ORS;  Service: Gynecology;  Laterality: N/A;  . iud extraction  10/31/2016  . WISDOM TOOTH EXTRACTION      There were no vitals filed for this visit.   Subjective Assessment - 11/22/20 1809    Subjective Pt doing well today. Reports good response to needling last session. Has some tightness in Rt anteiror hip with narrow squats earlier.    Pertinent History Patient is a 35 y.o. female who presents to outpatient physical therapy with a referral for medical diagnosis acute pain of right hip. This patient's chief complaints consist of right hip pain near glute med leading to the following functional deficits: constant pain is distracting and it restricts her workouts and running activities, disrupts sleep and prolonged sitting, traveling, working. .  Relevant past medical history and comorbidities include history of back pain (episode as a youth and last year) and neck pain, R hip pain  with two prior episodes in the past two years.    Currently in Pain? No/denies            INTERVENTION THIS DATE: Prone MMT hip IR/ER; all very strong 5/5, Left side has intermittent hamstrings cramping Palpation or Rt anterior hip joint with palpable mild effusion of joint, correlated tightness subjectively  -Full depth squat with heels on half roll 1x10 (no pain; improved tightness in right hip by the end)  -sustained release to Right posterior gluteus medius x10 minutes; followed by ART x 3 minutes  -sustained release to Right anterior gluteus minimus/medius x5 minutes; followed by ART x 2 minutes  -sustained release to Right anterior adductor longus/pectineus x 5 minutes   -SLR c slight ABDCT (15 degrees) and sustained hip IR 2x15 bilat(effective inhibition of groin hip flexion; increased TFL/gluteal hip flexion)  -Left sidelying Rt hip ABDCT 3lb 1x15 -" " Rt hip reverse clam 2x15 c 3lb AW -" " Rt hip circles in slight hip ABDCT 1x10 CW, 1x10 CCW (more pain in gluteal than anticipated, DC activity)   *education on avoiding narrow stance squats in future due to (+) FADIR  *education on sumo squat stretch of groin       PT Short Term Goals - 11/08/20 1425      PT SHORT TERM GOAL #1   Title Be independent with initial home exercise program for self-management of symptoms.    Baseline initial HEP provided at IE (11/07/2020);  Time 2    Period Weeks    Status New    Target Date 11/22/20             PT Long Term Goals - 11/08/20 1425      PT LONG TERM GOAL #1   Title Demonstrate improved FOTO score to equal or greater than 88 by visit # 11 to demonstrate improvement in overall condition and self-reported functional ability.    Baseline 77 (11/07/2020);    Time 12    Period Weeks    Status New   TARGET DATE FOR ALL LONG TERM GOALS: 01/30/2021     PT LONG TERM GOAL #2   Title Patient will be able to sleep through the night without disruption from R hip pain.    Baseline  currently disrupts slee (11/07/2020);    Time 12    Period Weeks    Status New      PT LONG TERM GOAL #3   Title Patient will report equal or less than 1/10 pain with functional activities to all her to complete her usual activities such as running, sitting, with less difficulty.    Baseline up to 8/10 (11/07/2020);    Time 12    Period Weeks    Status New      PT LONG TERM GOAL #4   Title Complete community, work and/or recreational activities without limitation due to current condition.    Baseline Functional Limitations: constant pain is distracting and it restricts her workouts and running activities, disrupts sleep and prolonged sitting, traveling, working (11/08/2019);    Time 12    Period Weeks    Status New      PT LONG TERM GOAL #5   Title Be independent with a long-term home exercise program for self-management of symptoms.    Baseline initial HEP provided at IE (11/07/2020);    Time 12    Period Weeks    Status New                 Plan - 11/22/20 1813    Clinical Impression Statement Continued to manual therapies to address taut bands in Rt lateral gluteals and right short adductors. Palpable effusion of right hip joint. Education on avoiding aggravating factors of FAI. Pt has significant tenderness of gluteals, beyond what is expected, which translates to unexpected pain with gluteal activation exercises, suspect injury to gluteals remains in post-acute phase, question if patient may be over-stretching tissue. Pt continues to make progress in general, was able to perform an entire workout at home today without any pain limitations.    Personal Factors and Comorbidities Age;Fitness;Past/Current Experience;Comorbidity 2;Behavior Pattern    Comorbidities history of low back pain (episode as a youth and last year) and neck pain, R hip pain with two prior episodes in the past two years.    Examination-Activity Limitations Sleep;Sit;Squat;Stairs    Examination-Participation  Restrictions Interpersonal Relationship;Driving;Community Activity;Occupation    Stability/Clinical Decision Making Stable/Uncomplicated    Clinical Decision Making Low    Rehab Potential Good    PT Frequency Other (comment)    PT Duration 12 weeks    PT Treatment/Interventions ADLs/Self Care Home Management;Cryotherapy;Electrical Stimulation;Moist Heat;Therapeutic activities;Therapeutic exercise;Balance training;Neuromuscular re-education;Patient/family education;Manual techniques;Dry needling;Passive range of motion;Spinal Manipulations;Joint Manipulations;Aquatic Therapy;Biofeedback;Traction    PT Next Visit Plan continue stretches, LAX ball self mobilization    PT Home Exercise Plan To be updated as appropriate at next session.    Consulted and Agree with Plan of Care Patient  Patient will benefit from skilled therapeutic intervention in order to improve the following deficits and impairments:  Increased muscle spasms,Pain,Decreased activity tolerance,Decreased endurance,Decreased mobility,Difficulty walking,Impaired perceived functional ability,Postural dysfunction,Hypermobility,Decreased coordination,Decreased balance  Visit Diagnosis: Pain in right hip  Muscle weakness (generalized)     Problem List Patient Active Problem List   Diagnosis Date Noted  . Abdominal pain 05/30/2020  . Acute hip pain 07/19/2019  . Rash/skin eruption 07/16/2019  . Preventative health care 04/16/2018   6:42 PM, 11/22/20 [name], PT, DPT Physical Therapist - [place] [phone] (Office)    Daley Mooradian C 11/22/2020, 6:26 PM  [facility] PHYSICAL AND SPORTS MEDICINE [address], [place], [address] Phone: [phone]   Fax:  [phone]  Name: Lori Mcgee MRN: [id number] Date of Birth: [birth year]

## 2020-11-24 ENCOUNTER — Ambulatory Visit: Payer: No Typology Code available for payment source | Admitting: Primary Care

## 2020-11-24 ENCOUNTER — Other Ambulatory Visit: Payer: Self-pay

## 2020-11-24 ENCOUNTER — Ambulatory Visit (INDEPENDENT_AMBULATORY_CARE_PROVIDER_SITE_OTHER): Payer: No Typology Code available for payment source | Admitting: Primary Care

## 2020-11-24 VITALS — BP 118/78 | HR 86 | Temp 96.8°F | Wt 190.0 lb

## 2020-11-24 DIAGNOSIS — Z833 Family history of diabetes mellitus: Secondary | ICD-10-CM | POA: Diagnosis not present

## 2020-11-24 DIAGNOSIS — E663 Overweight: Secondary | ICD-10-CM

## 2020-11-24 DIAGNOSIS — R42 Dizziness and giddiness: Secondary | ICD-10-CM

## 2020-11-24 DIAGNOSIS — M25551 Pain in right hip: Secondary | ICD-10-CM

## 2020-11-24 LAB — COMPREHENSIVE METABOLIC PANEL
ALT: 10 U/L (ref 0–35)
AST: 14 U/L (ref 0–37)
Albumin: 4.4 g/dL (ref 3.5–5.2)
Alkaline Phosphatase: 63 U/L (ref 39–117)
BUN: 17 mg/dL (ref 6–23)
CO2: 28 mEq/L (ref 19–32)
Calcium: 9.8 mg/dL (ref 8.4–10.5)
Chloride: 105 mEq/L (ref 96–112)
Creatinine, Ser: 0.84 mg/dL (ref 0.40–1.20)
GFR: 90.25 mL/min (ref >60.00)
Glucose, Bld: 83 mg/dL (ref 70–99)
Potassium: 4.6 mEq/L (ref 3.5–5.1)
Sodium: 138 mEq/L (ref 135–145)
Total Bilirubin: 0.6 mg/dL (ref 0.2–1.2)
Total Protein: 7.2 g/dL (ref 6.0–8.3)

## 2020-11-24 LAB — LIPID PANEL
Cholesterol: 185 mg/dL (ref 0–200)
HDL: 47.8 mg/dL (ref >39.00)
LDL Cholesterol: 125 mg/dL — ABNORMAL HIGH (ref 0–99)
NonHDL: 137.35
Total CHOL/HDL Ratio: 4
Triglycerides: 62 mg/dL (ref 0.0–149.0)
VLDL: 12.4 mg/dL (ref 0.0–40.0)

## 2020-11-24 LAB — VITAMIN D 25 HYDROXY (VIT D DEFICIENCY, FRACTURES): VITD: 23.57 ng/mL — ABNORMAL LOW (ref 30.00–100.00)

## 2020-11-24 LAB — CBC
HCT: 38.5 % (ref 36.0–46.0)
Hemoglobin: 13.2 g/dL (ref 12.0–15.0)
MCHC: 34.3 g/dL (ref 30.0–36.0)
MCV: 90.7 fl (ref 78.0–100.0)
Platelets: 322 10*3/uL (ref 150.0–400.0)
RBC: 4.24 Mil/uL (ref 3.87–5.11)
RDW: 13 % (ref 11.5–15.5)
WBC: 6.9 10*3/uL (ref 4.0–10.5)

## 2020-11-24 LAB — TSH: TSH: 1.03 u[IU]/mL (ref 0.35–4.50)

## 2020-11-24 LAB — VITAMIN B12: Vitamin B-12: 315 pg/mL (ref 211–911)

## 2020-11-24 LAB — T4, FREE: Free T4: 0.9 ng/dL (ref 0.60–1.60)

## 2020-11-24 LAB — HEMOGLOBIN A1C: Hgb A1c MFr Bld: 5.2 % (ref 4.6–6.5)

## 2020-11-24 NOTE — Assessment & Plan Note (Signed)
Acute episode, resolved after meeting with her supervisor at work.  Suspect this was stress/anxiety related given her prior work environment. Work environment has improved now. She will monitor symptoms.  Labs pending.

## 2020-11-24 NOTE — Assessment & Plan Note (Signed)
Improving with physical therapy, continue same.

## 2020-11-24 NOTE — Patient Instructions (Signed)
Stop by the lab prior to leaving today. I will notify you of your results once received.   Continue exercising. You should be getting 150 minutes of moderate intensity exercise weekly.  [facility]  It was a pleasure to see you today!

## 2020-11-24 NOTE — Assessment & Plan Note (Signed)
Frustrated with weight loss of 3 pounds since November 2021, commended her on this and told her that this was a very positive thing!  Will check labs today to rule out metabolic cause. Encouraged to continue tracking calories and exercise regularly.   Await results.

## 2020-11-24 NOTE — Progress Notes (Signed)
Subjective:    Patient ID: NOELIA LENART, female    DOB: 16-Jul-1986, 35 y.o.   MRN: [id number]  HPI  This visit occurred during the SARS-CoV-2 public health emergency.  Safety protocols were in place, including screening questions prior to the visit, additional usage of staff PPE, and extensive cleaning of exam room while observing appropriate contact time as indicated for disinfecting solutions.   Ms. Kendrix is a 35 year old female with a history of right hip pain, left lower back pain who presents today for follow up.  1) Acute Hip Pain: She was last evaluated via phone on 10/26/20 for symptoms of acute hip pain and a requested evaluation with physical therapy. At that time she endorsed a two week long "nagging pain" to the right hip and gluteal region that occurred after running twice without stretching. This was the same sort of symptoms she had experienced in 2020, was referred to physical therapy by Dr. Milinda Antis at the time, had complete resolve in symptoms. During her last phone visit we referred her over to physical therapy as requested. Exam was limited given phone call visit.  Since her phone visit she's noticed improvement to her right hip pain, she has completed 3-4 sessions of physical therapy and is pleased with her progress thus far.   2) Overweight: Today she would like to discuss her weight. She's very frustrated with her weight despite her efforts of weight loss since November 2021. She's been meeting with a nutritionist since November 2021, has been altering her calories/carbs, tracking calories, eating between 1500-1800 calories. She has been exercising four days weekly (both cardio and weights) for months.   She's lost three pounds within the last 1.5 months which is frustrating for her as she would expect to have lost more given her efforts. Over the last four weeks she's still watching her diet but not tracking like she was, this is largely due to frustration. She has not  weighed at home in 4 weeks. She continues to exercise. She has a family history of diabetes in her father. She contacted the [facility] but did not qualify for treatment given lower BMI.  3) Dizziness: Acute episode occurring for three days on 11/13/20, also with difficulty falling and staying asleep. Glucose reading at the time was 234, she checked on her father's meter, had not eaten in three hours so she became concerned.   Today she denies further dizziness episodes or sleep disturbance since. She endorsed a lot of stress at work with her supervisor, had a sit down conversation with her supervisor to discuss her concerns, things have been much better since.  The only anxiety she has is towards her weight.   Wt Readings from Last 3 Encounters:  11/24/20 190 lb (86.2 kg)  10/26/20 186 lb (84.4 kg)  10/11/20 186 lb 6 oz (84.5 kg)   BP Readings from Last 3 Encounters:  11/24/20 118/78  10/11/20 (!) 112/57  05/30/20 116/82     Review of Systems  Respiratory: Negative for shortness of breath.   Cardiovascular: Negative for chest pain.  Musculoskeletal: Positive for arthralgias.  Neurological: Negative for dizziness and headaches.  Psychiatric/Behavioral:       See HPI       Past Medical History:  Diagnosis Date   BV (bacterial vaginosis)      Social History   Socioeconomic History   Marital status: Married    Spouse name: Not on file   Number of  children: Not on file   Years of education: Not on file   Highest education level: Not on file  Occupational History   Not on file  Tobacco Use   Smoking status: Never Smoker   Smokeless tobacco: Never Used  Vaping Use   Vaping Use: Never used  Substance and Sexual Activity   Alcohol use: No   Drug use: No   Sexual activity: Yes    Birth control/protection: None  Other Topics Concern   Not on file  Social History Narrative   Married.   3 children.   Works in [employer].    Enjoys exercising and playing basketball.     Social Determinants of Health   Financial Resource Strain: Not on file  Food Insecurity: Not on file  Transportation Needs: Not on file  Physical Activity: Not on file  Stress: Not on file  Social Connections: Not on file  Intimate Partner Violence: Not on file    Past Surgical History:  Procedure Laterality Date   HYSTEROSCOPY N/A 10/31/2016   Procedure: HYSTEROSCOPY WITH IUD REMOVAL;  Surgeon: Hildred Laser, MD;  Location: [facility] ORS;  Service: Gynecology;  Laterality: N/A;   iud extraction  10/31/2016   WISDOM TOOTH EXTRACTION      Family History  Problem Relation Age of Onset   Diabetes Father    Hypertension Father    Hypothyroidism Father    Hypertension Mother    Breast cancer Paternal Aunt        late 17's or early 37's    No Known Allergies  No current outpatient medications on file prior to visit.   No current facility-administered medications on file prior to visit.    BP 118/78    Pulse 86    Temp (!) 96.8 F (36 C) (Temporal)    Wt 190 lb (86.2 kg)    LMP 10/25/2020 (Exact Date)    SpO2 99%    BMI 27.66 kg/m    Objective:   Physical Exam Constitutional:      Appearance: She is well-nourished.  Cardiovascular:     Rate and Rhythm: Normal rate and regular rhythm.  Pulmonary:     Effort: Pulmonary effort is normal.     Breath sounds: Normal breath sounds.  Musculoskeletal:     Cervical back: Neck supple.     Comments: Ambulates well in clinic. No difficulty getting up and down from exam table.  Skin:    General: Skin is warm and dry.  Psychiatric:        Mood and Affect: Mood and affect normal.            Assessment & Plan:

## 2020-11-29 ENCOUNTER — Ambulatory Visit: Payer: No Typology Code available for payment source

## 2020-11-29 ENCOUNTER — Other Ambulatory Visit: Payer: Self-pay

## 2020-11-29 DIAGNOSIS — M25551 Pain in right hip: Secondary | ICD-10-CM | POA: Diagnosis not present

## 2020-11-29 DIAGNOSIS — M6281 Muscle weakness (generalized): Secondary | ICD-10-CM

## 2020-11-29 NOTE — Therapy (Signed)
[place] [facility]INE [address], [place], [address] Phone: [phone]   Fax:  [phone]  Physical Therapy Treatment  Patient Details  Name: Lori Mcgee MRN: [id number] Date of Birth: 07-May-1986 Referring Provider (PT): Doreene Nest, NP   Encounter Date: 11/29/2020   PT End of Session - 11/29/20 1829    Visit Number 4    Number of Visits 24    Date for PT Re-Evaluation 01/30/21    Authorization Type [employer] [employer] reporting period from 11/07/2020    Authorization Time Period Current cert period: 07/21/2019 - 10/14/2019    PT Start Time 1503    PT Stop Time 1530    PT Time Calculation (min) 27 min    Activity Tolerance Patient tolerated treatment well;No increased pain    Behavior During Therapy WFL for tasks assessed/performed           Past Medical History:  Diagnosis Date  . BV (bacterial vaginosis)     Past Surgical History:  Procedure Laterality Date  . HYSTEROSCOPY N/A 10/31/2016   Procedure: HYSTEROSCOPY WITH IUD REMOVAL;  Surgeon: Hildred Laser, MD;  Location: [facility] ORS;  Service: Gynecology;  Laterality: N/A;  . iud extraction  10/31/2016  . WISDOM TOOTH EXTRACTION      There were no vitals filed for this visit.   Subjective Assessment - 11/29/20 1706    Subjective Pt doing well today. Reports Increased L knee pain from idiopathetic onset. Patient states she is late secondary to having to have a work meeting.    Pertinent History Patient is a 35 y.o. female who presents to outpatient physical therapy with a referral for medical diagnosis acute pain of right hip. This patient's chief complaints consist of right hip pain near glute med leading to the following functional deficits: constant pain is distracting and it restricts her workouts and running activities, disrupts sleep and prolonged sitting, traveling, working. .  Relevant past medical history and comorbidities include history of  back pain (episode as a youth and last year) and neck pain, R hip pain with two prior episodes in the past two years.    Currently in Pain? No/denies             TREATMENT Therapeutic Exercise Sidelying hip ER -- x10  Manual therapy Ischemic compression along the lateral gluteal musculature to improve pain and spasms especially with going up hill while running with patient positioned in sidelying -- x During manual therapy Dry needling performed to the glute med on the R side to decrease pain and spasms along the affected area with patient in sidelying. Patient educated on the potential risks and benefits to treatment and verbally consents to treatment.     PT Education - 11/29/20 1828    Education Details explanation of dry needling    Person(s) Educated Patient    Methods Explanation;Demonstration    Comprehension Verbalized understanding            PT Short Term Goals - 11/08/20 1425      PT SHORT TERM GOAL #1   Title Be independent with initial home exercise program for self-management of symptoms.    Baseline initial HEP provided at IE (11/07/2020);    Time 2    Period Weeks    Status New    Target Date 11/22/20             PT Long Term Goals - 11/08/20 1425  PT LONG TERM GOAL #1   Title Demonstrate improved FOTO score to equal or greater than 88 by visit # 11 to demonstrate improvement in overall condition and self-reported functional ability.    Baseline 77 (11/07/2020);    Time 12    Period Weeks    Status New   TARGET DATE FOR ALL LONG TERM GOALS: 01/30/2021     PT LONG TERM GOAL #2   Title Patient will be able to sleep through the night without disruption from R hip pain.    Baseline currently disrupts slee (11/07/2020);    Time 12    Period Weeks    Status New      PT LONG TERM GOAL #3   Title Patient will report equal or less than 1/10 pain with functional activities to all her to complete her usual activities such as running, sitting, with  less difficulty.    Baseline up to 8/10 (11/07/2020);    Time 12    Period Weeks    Status New      PT LONG TERM GOAL #4   Title Complete community, work and/or recreational activities without limitation due to current condition.    Baseline Functional Limitations: constant pain is distracting and it restricts her workouts and running activities, disrupts sleep and prolonged sitting, traveling, working (11/08/2019);    Time 12    Period Weeks    Status New      PT LONG TERM GOAL #5   Title Be independent with a long-term home exercise program for self-management of symptoms.    Baseline initial HEP provided at IE (11/07/2020);    Time 12    Period Weeks    Status New                 Plan - 11/29/20 1829    Clinical Impression Statement Focused on performing manual therapy with dry needling today. Performed therapeutic exercise of sidelying hip ER with reproduced hip pain. Dry needled afterwards. Educated patient to perform exercises through endurance range to lead to improvement in running and muscular endurance. Patient responds well to treatment overall.    Personal Factors and Comorbidities Age;Fitness;Past/Current Experience;Comorbidity 2;Behavior Pattern    Comorbidities history of low back pain (episode as a youth and last year) and neck pain, R hip pain with two prior episodes in the past two years.    Examination-Activity Limitations Sleep;Sit;Squat;Stairs    Examination-Participation Restrictions Interpersonal Relationship;Driving;Community Activity;Occupation    Stability/Clinical Decision Making Stable/Uncomplicated    Rehab Potential Good    PT Frequency Other (comment)    PT Duration 12 weeks    PT Treatment/Interventions ADLs/Self Care Home Management;Cryotherapy;Electrical Stimulation;Moist Heat;Therapeutic activities;Therapeutic exercise;Balance training;Neuromuscular re-education;Patient/family education;Manual techniques;Dry needling;Passive range of motion;Spinal  Manipulations;Joint Manipulations;Aquatic Therapy;Biofeedback;Traction    PT Next Visit Plan continue stretches, LAX ball self mobilization    PT Home Exercise Plan To be updated as appropriate at next session.    Consulted and Agree with Plan of Care Patient           Patient will benefit from skilled therapeutic intervention in order to improve the following deficits and impairments:  Increased muscle spasms,Pain,Decreased activity tolerance,Decreased endurance,Decreased mobility,Difficulty walking,Impaired perceived functional ability,Postural dysfunction,Hypermobility,Decreased coordination,Decreased balance  Visit Diagnosis: Pain in right hip  Muscle weakness (generalized)     Problem List Patient Active Problem List   Diagnosis Date Noted  . Overweight (BMI 25.0-29.9) 11/24/2020  . Dizziness 11/24/2020  . Abdominal pain 05/30/2020  . Acute hip pain 07/19/2019  .  Rash/skin eruption 07/16/2019  . Preventative health care 04/16/2018    Myrene Galas, PT DPT 11/29/2020, 6:33 PM  [place] [facility] PHYSICAL AND SPORTS MEDICINE [address] [place], [address] Phone: [phone]   Fax:  [phone]  Name: Lori Mcgee MRN: [id number] Date of Birth: April 20, 1986

## 2020-11-30 LAB — ESTROGENS, TOTAL: Estrogen: 290.7 pg/mL

## 2020-12-04 ENCOUNTER — Ambulatory Visit: Payer: No Typology Code available for payment source | Admitting: Physical Therapy

## 2020-12-06 ENCOUNTER — Encounter: Payer: Self-pay | Admitting: Physical Therapy

## 2020-12-06 ENCOUNTER — Other Ambulatory Visit: Payer: Self-pay

## 2020-12-06 ENCOUNTER — Ambulatory Visit: Payer: No Typology Code available for payment source | Attending: Primary Care | Admitting: Physical Therapy

## 2020-12-06 DIAGNOSIS — M6281 Muscle weakness (generalized): Secondary | ICD-10-CM | POA: Diagnosis present

## 2020-12-06 DIAGNOSIS — M25551 Pain in right hip: Secondary | ICD-10-CM | POA: Diagnosis not present

## 2020-12-06 DIAGNOSIS — R202 Paresthesia of skin: Secondary | ICD-10-CM | POA: Diagnosis present

## 2020-12-06 DIAGNOSIS — M542 Cervicalgia: Secondary | ICD-10-CM | POA: Insufficient documentation

## 2020-12-06 DIAGNOSIS — R29898 Other symptoms and signs involving the musculoskeletal system: Secondary | ICD-10-CM | POA: Insufficient documentation

## 2020-12-06 DIAGNOSIS — M546 Pain in thoracic spine: Secondary | ICD-10-CM | POA: Insufficient documentation

## 2020-12-06 NOTE — Therapy (Signed)
[place] [facility]INE [address] [place], [address] Phone: [phone]   Fax:  [phone]  Physical Therapy Treatment  Patient Details  Name: Lori Mcgee MRN: [id number] Date of Birth: [birth year] Referring Provider (PT): Doreene Nest, NP   Encounter Date: 12/06/2020   PT End of Session - 12/06/20 2020    Visit Number 5    Number of Visits 24    Date for PT Re-Evaluation 01/30/21    Authorization Type [place] [employer] reporting period from 11/07/2020    Progress Note Due on Visit 10    PT Start Time 1840    PT Stop Time 1930    PT Time Calculation (min) 50 min    Activity Tolerance Patient tolerated treatment well;Patient limited by pain    Behavior During Therapy [facility] for tasks assessed/performed           Past Medical History:  Diagnosis Date  . BV (bacterial vaginosis)     Past Surgical History:  Procedure Laterality Date  . HYSTEROSCOPY N/A 10/31/2016   Procedure: HYSTEROSCOPY WITH IUD REMOVAL;  Surgeon: Hildred Laser, MD;  Location: [facility] ORS;  Service: Gynecology;  Laterality: N/A;  . iud extraction  10/31/2016  . WISDOM TOOTH EXTRACTION      There were no vitals filed for this visit.   Subjective Assessment - 12/06/20 1847    Subjective Patient reports she is "miserable" with 6/10 pain in the left upper trap, clavicle and scapular region. She did an armworkout on Saturday and was sore but then she had this pain as the soreness receeded and it resembles pain she has had in the past.. Also states she has an appointment with OBGYN for pain that she has been having for months following arm workouts, especcial pushing, around her left breast and axillary region. She reports her R hip is feeling a lot better and she just got done with a 3 mile run. She is feeling very frustrated and limited by her neck and chest pain.    Pertinent History Patient is a 35 y.o. female who presents to outpatient  physical therapy with a referral for medical diagnosis acute pain of right hip. This patient's chief complaints consist of right hip pain near glute med leading to the following functional deficits: constant pain is distracting and it restricts her workouts and running activities, disrupts sleep and prolonged sitting, traveling, working. .  Relevant past medical history and comorbidities include history of back pain (episode as a youth and last year) and neck pain, R hip pain with two prior episodes in the past two years.    Currently in Pain? Yes    Pain Score 6            OBJECTIVE  Cervical spine AROM  - Flexion: limited, concordant pain at L UT - extension: limited, concordant pain at L UT - Sidebending: R = pulling, L = concordant pain at L UT - Rotation: less limited/painful bilaterally.  - Retraction: end range concordant pain at L UT  B UE AROM WFL but with some pain at left chest, axialla, UT  Muscle Resistance tests:   - left pec major clavicular and sternal head produced mild discomfort but not sharp pain. At least 4/5 (did not maximally stress)  PALPATION - TTP with concordant pain in that region at left cervical paraspinals, UT - TTP with concordant pain in that region at L mid clavicle - TTP with concordant chest  pain at pec major just distal to clavicle, slightly less pain but concordant for chest pain at pec major/minor near axilla.   ACCESSORY MOTION - CPA and L UPA concordant neck pain along left side of cervical spine, most prominent at mid cervical spine - CPA at upper thoracic and at ~ T10 also painful and hypomobile.   REPEATED MOTIONS - repeated retraction x 10, with self overpressure x 10 (end range pain no worse) - repeated left sidebending x 10 + 10 with overpressure, increase, worse medial to left scapula.  - repeated retraction with clinician overpressure 2x6, x10 decrease, better but plateaued, unable to tolerate retraction to extension - supine MDT  retraction and extension with rotation and traction, 3x6 with intermittent rotation, decreased better at neck, L UT, L clavicular region.  Did not stay better in upright.  - attempted retraction to extension in seated again, able to go a bit farther but still painful and unable.  - retraction with self overpressure 3x10    TREATMENT:  Therapeutic exercise: to centralize symptoms and improve ROM, strength, muscular endurance, and activity tolerance required for successful completion of functional activities.  - examination and exercises above to screen upper quarter/neck pain and provide guidance/treatment to allow patient to resume treatment for R hip.   Manual therapy: to reduce pain and tissue tension, improve range of motion, neuromodulation, in order to promote improved ability to complete functional activities. - supine STM to left pec major/minor to decrease pain and tension - supine/prone STM to L UT, cervical paraspinals to decrease pain.  - MDT technique (see above)  Modality: (unbilled) Dry needling performed to cervical spine to decrease pain and spasms along patient's cervical region with patient in prone utilizing (1) dry needle(s) .29mm x 4mm placed in 4 places along left upper trap and (1) dry needle(s) .69mm x 43mm inserted in two places at left multifidus region near C5 and C6. Patient educated about the risks and benefits from therapy and verbally consents to treatment.    PT Education - 12/06/20 2019    Education Details dry needling, exercise, POC, anatomy and physiology of current condition Education on HEP.    Person(s) Educated Patient    Methods Explanation;Demonstration;Tactile cues;Verbal cues    Comprehension Verbalized understanding;Returned demonstration;Verbal cues required;Tactile cues required;Need further instruction            PT Short Term Goals - 11/08/20 1425      PT SHORT TERM GOAL #1   Title Be independent with initial home exercise program  for self-management of symptoms.    Baseline initial HEP provided at IE (11/07/2020);    Time 2    Period Weeks    Status New    Target Date 11/22/20             PT Long Term Goals - 11/08/20 1425      PT LONG TERM GOAL #1   Title Demonstrate improved FOTO score to equal or greater than 88 by visit # 11 to demonstrate improvement in overall condition and self-reported functional ability.    Baseline 77 (11/07/2020);    Time 12    Period Weeks    Status New   TARGET DATE FOR ALL LONG TERM GOALS: 01/30/2021     PT LONG TERM GOAL #2   Title Patient will be able to sleep through the night without disruption from R hip pain.    Baseline currently disrupts slee (11/07/2020);    Time 12    Period  Weeks    Status New      PT LONG TERM GOAL #3   Title Patient will report equal or less than 1/10 pain with functional activities to all her to complete her usual activities such as running, sitting, with less difficulty.    Baseline up to 8/10 (11/07/2020);    Time 12    Period Weeks    Status New      PT LONG TERM GOAL #4   Title Complete community, work and/or recreational activities without limitation due to current condition.    Baseline Functional Limitations: constant pain is distracting and it restricts her workouts and running activities, disrupts sleep and prolonged sitting, traveling, working (11/08/2019);    Time 12    Period Weeks    Status New      PT LONG TERM GOAL #5   Title Be independent with a long-term home exercise program for self-management of symptoms.    Baseline initial HEP provided at IE (11/07/2020);    Time 12    Period Weeks    Status New                 Plan - 12/06/20 2040    Clinical Impression Statement Patient arrived with report of significant pain in the left upper quarter and cervical spine with onset since last session and affecting her ability to continue with intervention for hip. Assessed patient and found she had some response to extension  principle for cervical spine specific exercise. She has had similar pain patterns before that responded to this but this session she had only partial improvement. Also appears to have possible pec major strain but would benefit from further medical evaluation for her longstanding axillary region pain for which she is seeing OBGYN soon. She would benefit from a PT referral for the neck and chest region at the discretion of her medical provider to continue addressing this area if it does not resolve in the next few days. Patient would benefit from continued management of limiting condition by skilled physical therapist to address remaining impairments and functional limitations to work towards stated goals and return to PLOF or maximal functional independence.    Personal Factors and Comorbidities Age;Fitness;Past/Current Experience;Comorbidity 2;Behavior Pattern    Comorbidities history of low back pain (episode as a youth and last year) and neck pain, R hip pain with two prior episodes in the past two years.    Examination-Activity Limitations Sleep;Sit;Squat;Stairs    Examination-Participation Restrictions Interpersonal Relationship;Driving;Community Activity;Occupation    Stability/Clinical Decision Making Stable/Uncomplicated    Rehab Potential Good    PT Frequency Other (comment)    PT Duration 12 weeks    PT Treatment/Interventions ADLs/Self Care Home Management;Cryotherapy;Electrical Stimulation;Moist Heat;Therapeutic activities;Therapeutic exercise;Balance training;Neuromuscular re-education;Patient/family education;Manual techniques;Dry needling;Passive range of motion;Spinal Manipulations;Joint Manipulations;Aquatic Therapy;Biofeedback;Traction    PT Next Visit Plan exercises, manual, dry needling for Rt hip. possible referral for neck and upper quarter pain    PT Home Exercise Plan To be updated as appropriate at next session.    Consulted and Agree with Plan of Care Patient            Patient will benefit from skilled therapeutic intervention in order to improve the following deficits and impairments:  Increased muscle spasms,Pain,Decreased activity tolerance,Decreased endurance,Decreased mobility,Difficulty walking,Impaired perceived functional ability,Postural dysfunction,Hypermobility,Decreased coordination,Decreased balance  Visit Diagnosis: Pain in right hip     Problem List Patient Active Problem List   Diagnosis Date Noted  . Overweight (BMI 25.0-29.9) 11/24/2020  .  Dizziness 11/24/2020  . Abdominal pain 05/30/2020  . Acute hip pain 07/19/2019  . Rash/skin eruption 07/16/2019  . Preventative health care 04/16/2018    Luretha Murphy. Ilsa Iha, PT, DPT 12/06/20, 8:42 PM  [place] [facility] PHYSICAL AND SPORTS MEDICINE [address] [place], [address] Phone: [phone]   Fax:  [phone]  Name: Lori Mcgee MRN: [id number] Date of Birth: 1985/12/17

## 2020-12-11 ENCOUNTER — Ambulatory Visit: Payer: No Typology Code available for payment source | Admitting: Physical Therapy

## 2020-12-11 ENCOUNTER — Ambulatory Visit (INDEPENDENT_AMBULATORY_CARE_PROVIDER_SITE_OTHER): Payer: No Typology Code available for payment source | Admitting: Primary Care

## 2020-12-11 ENCOUNTER — Ambulatory Visit (INDEPENDENT_AMBULATORY_CARE_PROVIDER_SITE_OTHER)
Admission: RE | Admit: 2020-12-11 | Discharge: 2020-12-11 | Disposition: A | Discharge status: Home / Self Care | Payer: No Typology Code available for payment source | Source: Ambulatory Visit | Attending: Primary Care | Admitting: Primary Care

## 2020-12-11 ENCOUNTER — Ambulatory Visit (INDEPENDENT_AMBULATORY_CARE_PROVIDER_SITE_OTHER): Payer: No Typology Code available for payment source | Admitting: Certified Nurse Midwife

## 2020-12-11 ENCOUNTER — Encounter: Payer: Self-pay | Admitting: Physical Therapy

## 2020-12-11 ENCOUNTER — Encounter: Payer: Self-pay | Admitting: Primary Care

## 2020-12-11 ENCOUNTER — Other Ambulatory Visit: Payer: Self-pay

## 2020-12-11 ENCOUNTER — Encounter: Payer: Self-pay | Admitting: Certified Nurse Midwife

## 2020-12-11 VITALS — BP 116/72 | HR 70 | Ht 69.5 in | Wt 189.1 lb

## 2020-12-11 VITALS — BP 120/74 | HR 88 | Temp 98.4°F | Ht 65.9 in | Wt 188.0 lb

## 2020-12-11 DIAGNOSIS — N644 Mastodynia: Secondary | ICD-10-CM | POA: Diagnosis not present

## 2020-12-11 DIAGNOSIS — M25551 Pain in right hip: Secondary | ICD-10-CM | POA: Diagnosis not present

## 2020-12-11 DIAGNOSIS — M546 Pain in thoracic spine: Secondary | ICD-10-CM

## 2020-12-11 DIAGNOSIS — M898X1 Other specified disorders of bone, shoulder: Secondary | ICD-10-CM | POA: Diagnosis not present

## 2020-12-11 DIAGNOSIS — M542 Cervicalgia: Secondary | ICD-10-CM

## 2020-12-11 DIAGNOSIS — G8929 Other chronic pain: Secondary | ICD-10-CM | POA: Diagnosis not present

## 2020-12-11 DIAGNOSIS — R0781 Pleurodynia: Secondary | ICD-10-CM

## 2020-12-11 DIAGNOSIS — R29898 Other symptoms and signs involving the musculoskeletal system: Secondary | ICD-10-CM

## 2020-12-11 DIAGNOSIS — M25512 Pain in left shoulder: Secondary | ICD-10-CM | POA: Diagnosis not present

## 2020-12-11 DIAGNOSIS — M6281 Muscle weakness (generalized): Secondary | ICD-10-CM

## 2020-12-11 DIAGNOSIS — R202 Paresthesia of skin: Secondary | ICD-10-CM

## 2020-12-11 NOTE — Assessment & Plan Note (Signed)
Acute on chronic flare which could certainly be contributing to left upper extremity pain.  Will added PT note to address neck and clavicular pain.

## 2020-12-11 NOTE — Progress Notes (Signed)
Subjective:    Patient ID: Lori Mcgee, female    DOB: 08/27/1986, 35 y.o.   MRN: [id number]  HPI  This visit occurred during the SARS-CoV-2 public health emergency.  Safety protocols were in place, including screening questions prior to the visit, additional usage of staff PPE, and extensive cleaning of exam room while observing appropriate contact time as indicated for disinfecting solutions.   Lori Mcgee is a 35 year old female who presents today with a chief complaint of neck and clavicular pain.  Her pain is located to the upper cervical spine, began about 2 weeks ago, feels similar to her neck pain for which occurred about one year ago. At the time she was involved in PT, would like a new referral for PT to address this pain.   At the same time she began to notice left clavicular pain with radiation down to her left lateral trunk, left lateral breast. She does notice left upper extremity soreness. The clavicle is tender to touch. She is very active in the gym, works out five days weekly, denies overdoing anything at the gym recently. She's not been to the gym in four days due to pain.   She's not taken anything for her symptoms.   Review of Systems  Musculoskeletal: Positive for arthralgias, myalgias and neck pain.  Skin: Negative for color change.       Past Medical History:  Diagnosis Date  . BV (bacterial vaginosis)      Social History   Socioeconomic History  . Marital status: Married    Spouse name: Not on file  . Number of children: Not on file  . Years of education: Not on file  . Highest education level: Not on file  Occupational History  . Not on file  Tobacco Use  . Smoking status: Never Smoker  . Smokeless tobacco: Never Used  Vaping Use  . Vaping Use: Never used  Substance and Sexual Activity  . Alcohol use: No  . Drug use: No  . Sexual activity: Yes    Birth control/protection: None  Other Topics Concern  . Not on file  Social History  Narrative   Married.   3 children.   Works in [employer].    Enjoys exercising and playing basketball.     Social Determinants of Health   Financial Resource Strain: Not on file  Food Insecurity: Not on file  Transportation Needs: Not on file  Physical Activity: Not on file  Stress: Not on file  Social Connections: Not on file  Intimate Partner Violence: Not on file    Past Surgical History:  Procedure Laterality Date  . HYSTEROSCOPY N/A 10/31/2016   Procedure: HYSTEROSCOPY WITH IUD REMOVAL;  Surgeon: Hildred Laser, MD;  Location: [facility] ORS;  Service: Gynecology;  Laterality: N/A;  . iud extraction  10/31/2016  . WISDOM TOOTH EXTRACTION      Family History  Problem Relation Age of Onset  . Diabetes Father   . Hypertension Father   . Hypothyroidism Father   . Hypertension Mother   . Breast cancer Paternal Aunt        late 30's or early 40's    No Known Allergies  No current outpatient medications on file prior to visit.   No current facility-administered medications on file prior to visit.    BP 120/74   Pulse 88   Temp 98.4 F (36.9 C) (Temporal)   Ht 5' 5.9" (1.674 m)   Wt 188 lb (  85.3 kg)   SpO2 100%   BMI 30.44 kg/m    Objective:   Physical Exam Cardiovascular:     Rate and Rhythm: Normal rate and regular rhythm.  Pulmonary:     Effort: Pulmonary effort is normal.  Chest:       Comments: Tenderness to left clavicle upon palpation. No masses, deformity, or swelling noted.   Left lower rib tenderness to 10th rib, anterior side. Musculoskeletal:     Left shoulder: Normal. Normal range of motion. Normal strength.  Neurological:     Mental Status: She is alert.            Assessment & Plan:

## 2020-12-11 NOTE — Patient Instructions (Signed)
Complete xray(s) prior to leaving today. I will notify you of your results once received.  I placed additional information within your referral for the cervical spine and clavicle.   It was a pleasure to see you today!

## 2020-12-11 NOTE — Assessment & Plan Note (Addendum)
Acute for the last 2 weeks, no trauma. Exam today with tenderness. Suspect this is secondary to her rigorous exercise regimen. Could also be secondary to acute on chronic neck pain.  She is already following with PT, will added referral to address left clavicular pain and neck pain. Checking chest xray today.

## 2020-12-11 NOTE — Patient Instructions (Signed)

## 2020-12-11 NOTE — Therapy (Addendum)
[facility] [address] [place], [address] Phone: [phone]   Fax:  [phone]  Physical Therapy Re-Evaluation  Re-evaluation due to new concern at new body region with appropriate PT referral  Patient Details  Name: Lori Mcgee MRN: [id number] Date of Birth: [birth year] Referring Provider (PT): Pleas Koch, NP   Encounter Date: 12/11/2020   PT End of Session - 12/11/20 2052    Visit Number 6    Number of Visits 24    Date for PT Re-Evaluation 01/30/21    Authorization Type [employer] reporting period from 11/07/2020    Progress Note Due on Visit 10    PT Start Time 1850    PT Stop Time 1950    PT Time Calculation (min) 60 min    Activity Tolerance Patient tolerated treatment well;Patient limited by pain    Behavior During Therapy [facility] for tasks assessed/performed           Past Medical History:  Diagnosis Date  . BV (bacterial vaginosis)     Past Surgical History:  Procedure Laterality Date  . HYSTEROSCOPY N/A 10/31/2016   Procedure: HYSTEROSCOPY WITH IUD REMOVAL;  Surgeon: Rubie Maid, MD;  Location: [facility] ORS;  Service: Gynecology;  Laterality: N/A;  . iud extraction  10/31/2016  . WISDOM TOOTH EXTRACTION      There were no vitals filed for this visit.   Subjective Assessment - 12/11/20 1853    Subjective Patient reports she has seen two other doctors today (PCP and OBGYN) and they felt her pain was musculoskeletal in nature. She really wants to find the problem and so she can get better and would like to get an MRI to help with diagnosis. Since last session the pain down the back of her upper back near the left scapula is better but she started having arm pain over the wrist extensors with paresthesia in didgits 4 and 5 on the left hand. Arm is constantly feeling "tight" Hurts to turn head to the left and limited by increased pain at the left upper trap and left clavicle. Pain  at the clavicle has not changed and pain at the left ribs under the breast has not pain. She does not feel she is lacking strengh. She has not worked out in 5 days with no improvement in symptoms but it is causing her problems with her wellbeing. Her R hip is feeling better but not all the way well. No headaches. Nothing makes her pain obviously better or worse. Breast exam was normal today. Radiograph of chest was also normal. Patient has a new referral to PT for chronic neck pain from her PCP. [facility] referred her to orthopedics.    Pertinent History Patient is a 35 y.o. female who presents to outpatient physical therapy with a referral for medical diagnosis acute pain of right hip. This patient's chief complaints consist of right hip pain near glute med leading to the following functional deficits: constant pain is distracting and it restricts her workouts and running activities, disrupts sleep and prolonged sitting, traveling, working. .  Relevant past medical history and comorbidities include history of back pain (episode as a youth and last year) and neck pain, R hip pain with two prior episodes in the past two years.    Currently in Pain? Yes    Pain Score 5    W: 7/10 ; B: 3/10   Pain Location --   worst pain at  L clavicle   Pain Orientation Left    Effect of Pain on Daily Activities unable to workout, constant disruption of concentration, difficulty looking left, looking up, moving neck, using left arm, sleeping.           OBJECTIVE  OBSERVATION/INSPECTION . Posture: flattened thoracic kyphosis with sharp increased kyphosis at CT junction.   . Tremor: none . Muscle bulk: well muscled throughout body consistent with regular physical activity.  . Skin: WNL   NEUROLOGICAL  Upper Motor Neuron Screen Hoffman's negative bilaterally Dermatomes . C3-T1 appears equal and intact to light touch except diminished to light touch on left at C4-5 compared to right.   Myotomes . C2-T1 appears  intact   SPINE MOTION  Cervical Spine AROM *Indicates pain Flexion: = 35 Extension: = 65 pain down back, L clavicle, arm fine Rotation: R= 50, L = 55. (pain/tightness to left) Side Flexion: R= 30 pulling at let neck, L = 25 pain left anterior neck  Thoracic Spine AROM *Indicates pain - Flexion: = 35 (inclinometer at T1 and T12 - Extension: = tightness painful with neck extension, difficult to isolate. - Rotation: R = 60 , L = 45 tight and axillary pain - Side Flexion: R = no increased pain, L = no increased pain  PERIPHERAL JOINT MOTION (in degrees)  Active Range of Motion (AROM) B UE WNL except tightness at left axillary region with full shoulder flexion/abduction  Passive Range of Motion (PROM) Reproduction of pain at left axilla with latissimus dorsi stretch  MUSCLE PERFORMANCE (MMT):  B UE 5/5 without increased pain except left wrist flexion is 4/5 on left.  - no reproduction of pain with MMT to L serratus, 5/5 - discomfort with MMT sternal portion of left pec major, less discomfort with MMT to clavicular portion of left pec major. Both ~ 4+/5 limited by pain.   SPECIAL TESTS: Shoulder:  Neer's, cross-over, and AC joint shear negative bilaterally Hawkins-kennedy positive on left  for lateral shoulder pain (not concordant), negative right.    Cervical Spine: Axial compression and distraction negative Spurling's to R reproduced clavicular pain and anterior neck pain, L produced left neck pain (no arm pain).   Thoracic spine:  Quadrant test: R = negative, L = positive for axillary pain   ACCESSORY MOTION:  - Reproduction of L cervical spine pain with CPA to C6 - Reproduction of R scapular pain with CPA to ~ T5 - Reproduction of L scapular pain with CPA to ~ T6 - UPA/Spring testing to ~ 4th costotransverse joint and T4 transverse process at posterior spine reproduced clavicular pain - Sideling spring testing to the left ribs very tender ~ 3-5.  - Supine spring  testing at left sternocostal joints painful at 3rd -5th , especially near 4th.  PALPATION: - TTP along left cervical paraspinals, especially near C6 - TTP with lasting concordant pain provocation at left lat dorsi under axilla region.  - Mild TTP/tightness along left rhomboid region - Moderate TTP at left serratus anterior muscle but entire region near lat hypersensitive.       PT Education - 12/11/20 2052    Education Details Exercise purpose/form. Self management techniques. Education on diagnosis, prognosis, POC, anatomy and physiology of current condition    Person(s) Educated Patient    Methods Explanation;Demonstration    Comprehension Verbalized understanding;Need further instruction            PT Short Term Goals - 12/11/20 2054      PT SHORT TERM  GOAL #1   Title Be independent with initial home exercise program for self-management of symptoms.    Baseline initial HEP provided at IE (11/07/2020);    Time 2    Period Weeks    Status Revised    Target Date 12/25/20             PT Long Term Goals - 12/11/20 2049      PT LONG TERM GOAL #1   Title Demonstrate improved FOTO score to equal or greater than 88 by visit # 11 to demonstrate improvement in overall condition and self-reported functional ability.    Baseline 77 (11/07/2020);    Time 12    Period Weeks    Status On-going   TARGET DATE FOR ALL LONG TERM GOALS: 01/30/2021     PT LONG TERM GOAL #2   Title Patient will be able to sleep through the night without disruption from R hip pain or neck/axillary pain.    Baseline currently disrupts slee (11/07/2020);  disrupts sleep 12/11/2020);    Time 12    Period Weeks    Status Revised      PT LONG TERM GOAL #3   Title Patient will report equal or less than 1/10 pain with functional activities to all her to complete her usual activities such as running, sitting, working out, reaching, turning head with less difficulty.    Baseline up to 8/10 (11/07/2020);    Time 12     Period Weeks    Status Revised      PT LONG TERM GOAL #4   Title Complete community, work and/or recreational activities without limitation due to current condition.    Baseline Functional Limitations: constant pain is distracting and it restricts her workouts and running activities, disrupts sleep and prolonged sitting, traveling, working (11/08/2019); also using left UE, unable to complete usual workouts, constant disruption of concentration, difficulty looking left, looking up, moving neck, sleeping, etc without difficulty (12/11/2020);    Time 12    Period Weeks    Status Revised      PT LONG TERM GOAL #5   Title Be independent with a long-term home exercise program for self-management of symptoms.    Baseline initial HEP provided at IE (11/07/2020);    Time 12    Period Weeks    Status Partially Met                 Plan - 12/11/20 2046    Clinical Impression Statement Patient re-evaluated today due to new PT referral for chronic neck pain. Of note patient has a history of neck pain last year that resolved with physical therapy interventions of specific repeated motion (left lateral and extension) and dry needling.   Her pain today appears to be related to both lower levels of the cervical spine and upper thoracic spine and left ribs worst along the 4th rib as well as left latissimus dorsi and she may have two distinct (although related) contributors to pain to the more proximal regions and axillary region.   Patient is presenting with apparent cervical spine symptoms with onset in the last two weeks that have similarities to her neck pain last year including referral towards the left clavicular region, this time with less feeling of weakness in left arm but does report worsening pain and paresthesia in left arm along C6 and C8 dermatom regions. Today, she has reproduction of pain in her neck radiating towards her arm with CPA near C6 region. She  does have reproduction of clavicular pain  with cervical spine movements and pressure (UPA/sprining) to the thoracic vertebra and costotransverse joint at approximately T4 level. Left axillary pain is new since last year's episode of neck pain but has been bothering patient for several weeks and was not improved by 5 days of significant relative rest from her usual demanding exercises 5 days a week. She has strong reproduction of axillary symptoms with palpation to the left latissimus dorsi as well as when this muscle is placed on stretch. She has reproduction of pain in the left axillary region with pressure (UPA, springing) to the L T4 region and along the left ribs, especially the 4th rib, including at the sternum and has tenderness with resisted muscle testing in the left pec major (sternal portion > clavicular portion). Patient presents with significant pain, ROM, joint stiffness, muscle tension, muscle performance (strength/power/endurnce), posture, and activity tolerance impairments that are limiting ability to complete her usual activities such as using left UE, unable to complete usual workouts, constant disruption of concentration, difficulty looking left, looking up, moving neck, sleeping, etc without difficulty. Patient will benefit from skilled physical therapy intervention to address current body structure impairments and activity limitations to improve function and work towards goals set in current POC in order to return to prior level of function or maximal functional improvement. As well patient may benefit from further medical evaluation including imaging techniques such as MRI at the cervical and thoracic spine due to the lack of improvement in the axillary region with with rest, signs of connection of axillary pain to the left ribs and thoracic spine, and recurrent nature of her cervical spine symptoms.    Personal Factors and Comorbidities Age;Fitness;Past/Current Experience;Comorbidity 2;Behavior Pattern    Comorbidities history of  low back pain (episode as a youth and last year) and neck pain, R hip pain with two prior episodes in the past two years.    Examination-Activity Limitations Sleep;Sit;Squat;Stairs;Reach Overhead;Carry;Dressing    Examination-Participation Restrictions Interpersonal Relationship;Driving;Community Activity;Occupation;Meal Prep;Yard Work;Cleaning   using left UE, unable to complete usual workouts, constant disruption of concentration, difficulty looking left, looking up, moving neck, sleeping, etc without difficulty.   Stability/Clinical Decision Making Evolving/Moderate complexity    Clinical Decision Making Moderate    Rehab Potential Good    PT Frequency Other (comment)    PT Duration 12 weeks    PT Treatment/Interventions ADLs/Self Care Home Management;Cryotherapy;Electrical Stimulation;Moist Heat;Therapeutic activities;Therapeutic exercise;Balance training;Neuromuscular re-education;Patient/family education;Manual techniques;Dry needling;Passive range of motion;Spinal Manipulations;Joint Manipulations;Aquatic Therapy;Biofeedback;Traction;Taping    PT Next Visit Plan exercises, manual, dry needling.    PT Home Exercise Plan To be updated as appropriate at next session.    Recommended Other Services patient may benefit from further medical evaluation including imaging techniques such as MRI at the cervical and thoracic spine due to the lack of improvement in the axillary region with with rest, signs of connection of axillary pain to the left ribs and thoracic spine, and recurrent nature of her cervical spine symptoms.    Consulted and Agree with Plan of Care Patient           Patient will benefit from skilled therapeutic intervention in order to improve the following deficits and impairments:  Increased muscle spasms,Pain,Decreased activity tolerance,Decreased endurance,Decreased mobility,Difficulty walking,Impaired perceived functional ability,Postural dysfunction,Hypermobility,Decreased  coordination,Decreased balance,Impaired sensation,Decreased range of motion,Decreased strength,Impaired flexibility  Visit Diagnosis: Pain in right hip  Muscle weakness (generalized)  Cervicalgia  Pain in thoracic spine  Other symptoms and signs involving the musculoskeletal system  Paresthesia of skin     Problem List Patient Active Problem List   Diagnosis Date Noted  . Arthralgia of left acromioclavicular joint 12/11/2020  . Chronic neck pain 12/11/2020  . Overweight (BMI 25.0-29.9) 11/24/2020  . Dizziness 11/24/2020  . Abdominal pain 05/30/2020  . Acute hip pain 07/19/2019  . Rash/skin eruption 07/16/2019  . Preventative health care 04/16/2018    Everlean Alstrom. Graylon Good, PT, DPT 12/11/20, 8:57 PM  [facility] [address], [place], [address] Phone: [phone]   Fax:  [phone]  Name: ALEXXA SABET MRN: [id number] Date of Birth: Jun 14, 1986

## 2020-12-11 NOTE — Progress Notes (Signed)
GYN ENCOUNTER NOTE  Subjective:       Lori Mcgee is a 35 y.o. [id number] female is here for gynecologic evaluation of the following issues:   lefts sided clavicle pain, rib pain lefts side, and pain in her left breast. She states that it has been on going for the past month, she state she is physically active with exercise and weight lifting. .  Was seen today and her PCP and had x ray, they are sending her to PT.    Gynecologic History Patient's last menstrual period was 11/27/2020 (approximate). Contraception: none Last Pap: 05/30/17. Results were: normal Last mammogram: 12/06/19 . Results were: abnormal likely benign collapsing cyst involving the LOWER OUTER QUADRANT of the LEFT breast at 5 o'clock approximately 2 cm from the nipple  Obstetric History OB History  Gravida Para Term Preterm AB Living  3 3 3  0 0 3  SAB IAB Ectopic Multiple Live Births  0 0 0   3    # Outcome Date GA Lbr Len/2nd Weight Sex Delivery Anes PTL Lv  3 Term 11/23/13   7 lb 11 oz (3.487 kg) M Vag-Spont  N LIV  2 Term 11/01/11   6 lb 1 oz (2.75 kg) F Vag-Spont  N LIV  1 Term 02/03/09   8 lb 8 oz (3.856 kg) M Vag-Spont  N LIV    Past Medical History:  Diagnosis Date  . BV (bacterial vaginosis)     Past Surgical History:  Procedure Laterality Date  . HYSTEROSCOPY N/A 10/31/2016   Procedure: HYSTEROSCOPY WITH IUD REMOVAL;  Surgeon: 11/02/2016, MD;  Location: [facility] ORS;  Service: Gynecology;  Laterality: N/A;  . iud extraction  10/31/2016  . WISDOM TOOTH EXTRACTION      No current outpatient medications on file prior to visit.   No current facility-administered medications on file prior to visit.    No Known Allergies  Social History   Socioeconomic History  . Marital status: Married    Spouse name: Not on file  . Number of children: Not on file  . Years of education: Not on file  . Highest education level: Not on file  Occupational History  . Not on file  Tobacco Use  . Smoking status:  Never Smoker  . Smokeless tobacco: Never Used  Vaping Use  . Vaping Use: Never used  Substance and Sexual Activity  . Alcohol use: No  . Drug use: No  . Sexual activity: Yes    Birth control/protection: None  Other Topics Concern  . Not on file  Social History Narrative   Married.   3 children.   Works in 11/02/2016.    Enjoys exercising and playing basketball.     Social Determinants of Health   Financial Resource Strain: Not on file  Food Insecurity: Not on file  Transportation Needs: Not on file  Physical Activity: Not on file  Stress: Not on file  Social Connections: Not on file  Intimate Partner Violence: Not on file    Family History  Problem Relation Age of Onset  . Diabetes Father   . Hypertension Father   . Hypothyroidism Father   . Hypertension Mother   . Breast cancer Paternal Aunt        late 66's or early 14's    The following portions of the patient's history were reviewed and updated as appropriate: allergies, current medications, past family history, past medical history, past social history, past surgical history and problem  list.  Review of Systems Review of Systems - Negative except as mentione in Hpi Review of Systems - General ROS: negative for - chills, fatigue, fever, hot flashes, malaise or night sweats Hematological and Lymphatic ROS: negative for - bleeding problems or swollen lymph nodes Gastrointestinal ROS: negative for - abdominal pain, blood in stools, change in bowel habits and nausea/vomiting Musculoskeletal ROS: negative for - joint pain, muscle pain or muscular weakness Genito-Urinary ROS: negative for - change in menstrual cycle, dysmenorrhea, dyspareunia, dysuria, genital discharge, genital ulcers, hematuria, incontinence, irregular/heavy menses, nocturia or pelvic painjj  Objective:   BP 116/72   Pulse 70   Ht 5' 9.5" (1.765 m)   Wt 189 lb 2 oz (85.8 kg)   LMP 11/27/2020 (Approximate)   BMI 27.53 kg/m   CONSTITUTIONAL: Well-developed, well-nourished female in no acute distress.  HENT:  Normocephalic, atraumatic.  NECK: Normal range of motion, supple, no masses.  Normal thyroid.  SKIN: Skin is warm and dry. No rash noted. Not diaphoretic. No erythema. No pallor. NEUROLGIC: Alert and oriented to person, place, and time. PSYCHIATRIC: Normal mood and affect. Normal behavior. Normal judgment and thought content. CARDIOVASCULAR:Not Examined RESPIRATORY: Not Examined BREASTS: Breasts: breasts appear normal, no suspicious masses, no skin or nipple changes or axillary nodes. No masses pain noted at chest wall were breast tissue starts. Probable musculoskeletal no masses noted.  ABDOMEN: Soft, non distended; Non tender.  No Organomegaly. PELVIC:not indicated MUSCULOSKELETAL: Normal range of motion. No tenderness.  No cyanosis, clubbing, or edema.     Assessment:   1. Breast pain      Plan:   Discussed use of good bra for exercise, Heat and ice, tylenol , Advil. Order placed for orthopedic referral. Reassurance breast exam normal.   Doreene Burke, CNM

## 2020-12-12 ENCOUNTER — Ambulatory Visit: Payer: No Typology Code available for payment source | Admitting: Physical Therapy

## 2020-12-12 ENCOUNTER — Encounter: Payer: Self-pay | Admitting: Physical Therapy

## 2020-12-12 DIAGNOSIS — M25551 Pain in right hip: Secondary | ICD-10-CM

## 2020-12-12 DIAGNOSIS — R29898 Other symptoms and signs involving the musculoskeletal system: Secondary | ICD-10-CM

## 2020-12-12 DIAGNOSIS — M542 Cervicalgia: Secondary | ICD-10-CM

## 2020-12-12 DIAGNOSIS — R202 Paresthesia of skin: Secondary | ICD-10-CM

## 2020-12-12 DIAGNOSIS — M546 Pain in thoracic spine: Secondary | ICD-10-CM

## 2020-12-12 DIAGNOSIS — M6281 Muscle weakness (generalized): Secondary | ICD-10-CM

## 2020-12-12 NOTE — Therapy (Signed)
[facility] [address] [place], [address] Phone: [phone]   Fax:  [phone]  Physical Therapy Treatment  Patient Details  Name: Lori Mcgee MRN: [id number] Date of Birth: Apr 21, 1986 Referring Provider (PT): Pleas Koch, NP   Encounter Date: 12/12/2020   PT End of Session - 12/12/20 1131    Visit Number 7    Number of Visits 24    Date for PT Re-Evaluation 01/30/21    Authorization Type [place] [employer] reporting period from 11/07/2020    Progress Note Due on Visit 10    PT Start Time 0910    PT Stop Time 0945    PT Time Calculation (min) 35 min    Activity Tolerance Patient tolerated treatment well    Behavior During Therapy [facility] for tasks assessed/performed           Past Medical History:  Diagnosis Date  . BV (bacterial vaginosis)     Past Surgical History:  Procedure Laterality Date  . HYSTEROSCOPY N/A 10/31/2016   Procedure: HYSTEROSCOPY WITH IUD REMOVAL;  Surgeon: Rubie Maid, MD;  Location: [facility] ORS;  Service: Gynecology;  Laterality: N/A;  . iud extraction  10/31/2016  . WISDOM TOOTH EXTRACTION      There were no vitals filed for this visit.   Subjective Assessment - 12/12/20 0910    Subjective Patient reports her neck pain has moved to bother her more on the right side and she is not able to read this morming because it hurt too much to look down. She was surprised she did not have pain in her left axilla and ribs last night after her exam at PT. Rates her current pain as 5/10 more on the right neck and down the spine to the sacrum when she looks down and bends forward. Left arm feels better this morning. She slept differently than usual trying to avoid irritating left side. Left clavicular pain still present but better than previously.    Pertinent History Patient is a 35 y.o. female who presents to outpatient physical therapy with a referral for medical diagnosis acute  pain of right hip. This patient's chief complaints consist of right hip pain near glute med leading to the following functional deficits: constant pain is distracting and it restricts her workouts and running activities, disrupts sleep and prolonged sitting, traveling, working. .  Relevant past medical history and comorbidities include history of back pain (episode as a youth and last year) and neck pain, R hip pain with two prior episodes in the past two years.    Currently in Pain? Yes    Pain Score 5            TREATMENT:   Therapeutic exercise: to centralize symptoms and improve ROM, strength, muscular endurance, and activity tolerance required for successful completion of functional activities.  - seated cervical retraction x 10 - seated cervical retraction with self overpressure 4x10 (improved flexion, extension still hurts).   - standing thoracic extension with hands interlaced behind neck, x 10 - review of HEP recommendations  Manual therapy: to reduce pain and tissue tension, improve range of motion, neuromodulation, in order to promote improved ability to complete functional activities. - prone CPA and L UPA along cervical spine and upper thoracic spine with and without KE wedge. Reproduction of left clavicular pain with UPA to C6 and near T4-5.  - prone STM to posterior cervical spine muscles L > R  -  Supine STM to L latissimus dorsi  Modality: (unbilled) Dry needling performed to posterior cervical spine to decrease pain and spasms along patient's posterior cervical muscular region with patient in prone utilizing (1) dry needle(s) .63mm x 70mm with (3) sticks at L and R cervical multifidus at the C6 level. Reproduction of R sided pain with stick to right. Modality: (unbilled) Dry needling performed to left latissimus dorsi to decrease pain and spasms along patient's left axiallry region with patient in supine utilizing (1) dry needle(s) .75mm x 77mm with (2) sticks. Patient  educated about the risks and benefits from therapy and verbally consents to treatment.  Dry needling performed by Everlean Alstrom. Graylon Good PT, DPT who is certified in this technique.   HOME EXERCISE PROGRAM Access Code: [email or web address] URL: [email or web address] Date: 12/12/2020 Prepared by: Rosita Kea  Exercises Seated Posture with Lumbar Roll Cervical Retraction with Overpressure - 4 x daily - 2 sets - 10 reps - 1 second hold - every two hours Seated Thoracic Extension with Hands Behind Neck - 1-2 sets - 10 reps - 1 second hold Thoracic Extension Mobilization on Foam Roll - 1-2 sets - 10 reps Latissimus Mobilization on Foam Roll - up to 5 min Serratus Activation at Wall with Foam Roll - 5-3 reps - 5-20 seconds hold     PT Education - 12/12/20 1131    Education Details dry needling, exercise, anatomy and physiology of current condition Education on HEP.    Person(s) Educated Patient    Methods Explanation;Demonstration;Tactile cues;Verbal cues    Comprehension Verbalized understanding;Verbal cues required;Tactile cues required;Returned demonstration;Need further instruction            PT Short Term Goals - 12/11/20 2054      PT SHORT TERM GOAL #1   Title Be independent with initial home exercise program for self-management of symptoms.    Baseline initial HEP provided at IE (11/07/2020);    Time 2    Period Weeks    Status Revised    Target Date 12/25/20             PT Long Term Goals - 12/11/20 2049      PT LONG TERM GOAL #1   Title Demonstrate improved FOTO score to equal or greater than 88 by visit # 11 to demonstrate improvement in overall condition and self-reported functional ability.    Baseline 77 (11/07/2020);    Time 12    Period Weeks    Status On-going   TARGET DATE FOR ALL LONG TERM GOALS: 01/30/2021     PT LONG TERM GOAL #2   Title Patient will be able to sleep through the night without disruption from R hip pain or neck/axillary pain.    Baseline  currently disrupts slee (11/07/2020);  disrupts sleep 12/11/2020);    Time 12    Period Weeks    Status Revised      PT LONG TERM GOAL #3   Title Patient will report equal or less than 1/10 pain with functional activities to all her to complete her usual activities such as running, sitting, working out, reaching, turning head with less difficulty.    Baseline up to 8/10 (11/07/2020);    Time 12    Period Weeks    Status Revised      PT LONG TERM GOAL #4   Title Complete community, work and/or recreational activities without limitation due to current condition.    Baseline Functional Limitations: constant pain is distracting and it restricts her workouts  and running activities, disrupts sleep and prolonged sitting, traveling, working (11/08/2019); also using left UE, unable to complete usual workouts, constant disruption of concentration, difficulty looking left, looking up, moving neck, sleeping, etc without difficulty (12/11/2020);    Time 12    Period Weeks    Status Revised      PT LONG TERM GOAL #5   Title Be independent with a long-term home exercise program for self-management of symptoms.    Baseline initial HEP provided at IE (11/07/2020);    Time 12    Period Weeks    Status Partially Met                 Plan - 12/12/20 1140    Clinical Impression Statement Patient tolerated treatment well overall and demonstrated improved flexion AROM with repeated cervical retraction with overpressure this session. Reported feeling sore in the left lat dorsi but with decreased tightness. Also noted improved right sided pain after repeated cervical retraction and needling to the cervical multifidi. Recommended patient complete cervical retraction with overpressure 2x10 every 2 hours, attempt to avoid prolonged or repeated cervical and thoracic flexion, upper thoracic extension in standing and with foam roller, and to complete stretches for the the latissimus dorsi with optional self soft tissue  mobilization with foam roller or LAX balls. Advised patient to only complete pressing exercises in a controlled and metered setting.  Patient would benefit from continued management of limiting condition by skilled physical therapist to address remaining impairments and functional limitations to work towards stated goals and return to PLOF or maximal functional independence.    Personal Factors and Comorbidities Age;Fitness;Past/Current Experience;Comorbidity 2;Behavior Pattern    Comorbidities history of low back pain (episode as a youth and last year) and neck pain, R hip pain with two prior episodes in the past two years.    Examination-Activity Limitations Sleep;Sit;Squat;Stairs;Reach Overhead;Carry;Dressing    Examination-Participation Restrictions Interpersonal Relationship;Driving;Community Activity;Occupation;Meal Prep;Yard Work;Cleaning   using left UE, unable to complete usual workouts, constant disruption of concentration, difficulty looking left, looking up, moving neck, sleeping, etc without difficulty.   Stability/Clinical Decision Making Evolving/Moderate complexity    Rehab Potential Good    PT Frequency Other (comment)    PT Duration 12 weeks    PT Treatment/Interventions ADLs/Self Care Home Management;Cryotherapy;Electrical Stimulation;Moist Heat;Therapeutic activities;Therapeutic exercise;Balance training;Neuromuscular re-education;Patient/family education;Manual techniques;Dry needling;Passive range of motion;Spinal Manipulations;Joint Manipulations;Aquatic Therapy;Biofeedback;Traction;Taping    PT Next Visit Plan exercises, manual, dry needling.    PT Home Exercise Plan Medbridge Access Code: [email or web address]    Consulted and Agree with Plan of Care Patient           Patient will benefit from skilled therapeutic intervention in order to improve the following deficits and impairments:  Increased muscle spasms,Pain,Decreased activity tolerance,Decreased endurance,Decreased  mobility,Difficulty walking,Impaired perceived functional ability,Postural dysfunction,Hypermobility,Decreased coordination,Decreased balance,Impaired sensation,Decreased range of motion,Decreased strength,Impaired flexibility  Visit Diagnosis: Pain in right hip  Muscle weakness (generalized)  Cervicalgia  Pain in thoracic spine  Other symptoms and signs involving the musculoskeletal system  Paresthesia of skin     Problem List Patient Active Problem List   Diagnosis Date Noted  . Arthralgia of left acromioclavicular joint 12/11/2020  . Chronic neck pain 12/11/2020  . Overweight (BMI 25.0-29.9) 11/24/2020  . Dizziness 11/24/2020  . Abdominal pain 05/30/2020  . Acute hip pain 07/19/2019  . Rash/skin eruption 07/16/2019  . Preventative health care 04/16/2018    Everlean Alstrom. Graylon Good, PT, DPT 12/12/20, 11:50 AM  [facility] [address], [place], [address] Phone: [phone]   Fax:  [phone]  Name: Lori Mcgee MRN: [id number] Date of Birth: 1986/04/30

## 2020-12-13 ENCOUNTER — Telehealth: Payer: Self-pay | Admitting: Physical Therapy

## 2020-12-13 ENCOUNTER — Encounter: Payer: No Typology Code available for payment source | Admitting: Physical Therapy

## 2020-12-13 NOTE — Telephone Encounter (Signed)
Called patient back about her question about back pain with bowel movement. After discussing it with her it does not sound like discogenic pain with increased intraabdominal pressure. Advised it did not sound like pain related to her spine or cervicothoracic issues and to contact her medical providers if it continues to worry her.   Lori Mcgee. Ilsa Iha, PT, DPT 12/13/20, 10:39 AM

## 2020-12-19 ENCOUNTER — Ambulatory Visit: Payer: No Typology Code available for payment source | Admitting: Physical Therapy

## 2020-12-19 ENCOUNTER — Encounter: Payer: No Typology Code available for payment source | Admitting: Physical Therapy

## 2020-12-20 ENCOUNTER — Ambulatory Visit: Payer: No Typology Code available for payment source | Admitting: Family Medicine

## 2020-12-22 ENCOUNTER — Encounter: Payer: Self-pay | Admitting: Certified Nurse Midwife

## 2020-12-22 ENCOUNTER — Other Ambulatory Visit: Payer: Self-pay | Admitting: Certified Nurse Midwife

## 2020-12-22 ENCOUNTER — Ambulatory Visit (INDEPENDENT_AMBULATORY_CARE_PROVIDER_SITE_OTHER): Payer: No Typology Code available for payment source | Admitting: Certified Nurse Midwife

## 2020-12-22 ENCOUNTER — Other Ambulatory Visit: Payer: Self-pay

## 2020-12-22 VITALS — BP 113/78 | HR 84 | Ht 69.5 in | Wt 187.9 lb

## 2020-12-22 DIAGNOSIS — N898 Other specified noninflammatory disorders of vagina: Secondary | ICD-10-CM

## 2020-12-22 DIAGNOSIS — Z8742 Personal history of other diseases of the female genital tract: Secondary | ICD-10-CM

## 2020-12-22 MED ORDER — FLUCONAZOLE 150 MG PO TABS: 150.0000 mg | ORAL_TABLET | Freq: Once | ORAL | 0 refills | Status: AC

## 2020-12-22 NOTE — Progress Notes (Signed)
GYN ENCOUNTER NOTE  Subjective:       Lori Mcgee is a 35 y.o. [id number] female is here for gynecologic evaluation of the following issues:  1. Vaginal odor reports symptoms for the last several weeks; never completed treatment prescribed for yeast infection in December  Denies difficulty breathing or respiratory distress, chest pain, abdominal pain, excessive vaginal bleeding, dysuria, and leg pain or swelling.    Gynecologic History  Patient's last menstrual period was 11/27/2020 (approximate).  Contraception: condoms  Last Pap: 05/2017. Results were: Neg/Neg  Last mammogram: 05/2019. Results were: BI-RADS 3, followed by normal ultrasounds  Obstetric History  OB History  Gravida Para Term Preterm AB Living  3 3 3  0 0 3  SAB IAB Ectopic Multiple Live Births  0 0 0   3    # Outcome Date GA Lbr Len/2nd Weight Sex Delivery Anes PTL Lv  3 Term 11/23/13   7 lb 11 oz (3.487 kg) M Vag-Spont  N LIV  2 Term 11/01/11   6 lb 1 oz (2.75 kg) F Vag-Spont  N LIV  1 Term 02/03/09   8 lb 8 oz (3.856 kg) M Vag-Spont  N LIV    Past Medical History:  Diagnosis Date  . BV (bacterial vaginosis)     Past Surgical History:  Procedure Laterality Date  . HYSTEROSCOPY N/A 10/31/2016   Procedure: HYSTEROSCOPY WITH IUD REMOVAL;  Surgeon: 11/02/2016, MD;  Location: [facility] ORS;  Service: Gynecology;  Laterality: N/A;  . iud extraction  10/31/2016  . WISDOM TOOTH EXTRACTION      No Known Allergies  Social History   Socioeconomic History  . Marital status: Married    Spouse name: Not on file  . Number of children: Not on file  . Years of education: Not on file  . Highest education level: Not on file  Occupational History  . Not on file  Tobacco Use  . Smoking status: Never Smoker  . Smokeless tobacco: Never Used  Vaping Use  . Vaping Use: Never used  Substance and Sexual Activity  . Alcohol use: No  . Drug use: No  . Sexual activity: Yes    Birth control/protection: None  Other  Topics Concern  . Not on file  Social History Narrative   Married.   3 children.   Works in 11/02/2016.    Enjoys exercising and playing basketball.     Social Determinants of Health   Financial Resource Strain: Not on file  Food Insecurity: Not on file  Transportation Needs: Not on file  Physical Activity: Not on file  Stress: Not on file  Social Connections: Not on file  Intimate Partner Violence: Not on file    Family History  Problem Relation Age of Onset  . Diabetes Father   . Hypertension Father   . Hypothyroidism Father   . Hypertension Mother   . Breast cancer Paternal Aunt        late 37's or early 30's    The following portions of the patient's history were reviewed and updated as appropriate: allergies, current medications, past family history, past medical history, past social history, past surgical history and problem list.  Review of Systems  ROS negative except as noted above. Information obtained from patient.   Objective:   BP 113/78   Pulse 84   Ht 5' 9.5" (1.765 m)   Wt 187 lb 14.4 oz (85.2 kg)   LMP 11/27/2020 (Approximate)   BMI 27.35 kg/m  CONSTITUTIONAL: Well-developed, well-nourished female in no acute distress.   PELVIC:  External Genitalia: Normal  Vagina: Normal except for creamy, gritty white discharge present; wet prep positive positive yeast and whiff   MUSCULOSKELETAL: Normal range of motion. No tenderness.  No cyanosis, clubbing, or edema.   Assessment:   1. Vaginal odor   2. History of vaginitis    Plan:   Discussed vaginal health techniques.   Samples of Hylafem and Solosec given.   Rx Diflucan, see orders.   Reviewed red flag symptoms and when to call.   RTC as previously scheduled or sooner if needed.    Serafina Royals, CNM [facility], [facility]

## 2020-12-22 NOTE — Progress Notes (Signed)
Pt present for possible yeast infection. Pt stated having vaginal odor x several weeks. Pt denies any vaginal itching, discharge or burning just odor.

## 2020-12-22 NOTE — Patient Instructions (Addendum)
Secnidazole oral granules What is this medicine? SECNIDAZOLE (sek NID a zole) is an antiinfective. It is used to treat certain kinds of bacterial and protozoal infections. It will not work for colds, flu, or other viral infections. This medicine may be used for other purposes; ask your health care provider or pharmacist if you have questions. COMMON BRAND NAME(S): SOLOSEC What should I tell my health care provider before I take this medicine? They need to know if you have any of these conditions:  if you often drink alcohol  an unusual or allergic reaction to secnidazole, other medicines, foods, dyes, or preservatives  pregnant or trying to get pregnant  breast-feeding How should I use this medicine? Take this medicine by mouth. Follow the directions on the prescription label. Do not crush or chew this medicine. You can take it with or without food. If it upsets your stomach, take it with food. Take all of your medicine as directed. Talk to your pediatrician regarding the use of this medicine in children. Special care may be needed. Overdosage: If you think you have taken too much of this medicine contact a poison control center or emergency room at once. NOTE: This medicine is only for you. Do not share this medicine with others. What if I miss a dose? This does not apply; this medicine is not for regular use. What may interact with this medicine? Do not take this medicine with any of the following medications:  alcohol or any product that contains alcohol This list may not describe all possible interactions. Give your health care provider a list of all the medicines, herbs, non-prescription drugs, or dietary supplements you use. Also tell them if you smoke, drink alcohol, or use illegal drugs. Some items may interact with your medicine. What should I watch for while using this medicine? Tell your doctor or healthcare professional if your symptoms do not start to get better or if they  get worse. Some products may contain alcohol. Ask your health care provider if your medicines contain alcohol. Be sure to tell all health care providers you are taking this medicine. What side effects may I notice from receiving this medicine? Side effects that you should report to your doctor or health care professional as soon as possible:  allergic reactions (skin rash, itching or hives, swelling of the face, lips, or tongue) Side effects that usually do not require medical attention (report these to your doctor or health care professional if they continue or are bothersome):  changes in taste  diarrhea  headache  nausea, vomiting  stomach pain  vaginal discharge, itching, or odor in women This list may not describe all possible side effects. Call your doctor for medical advice about side effects. You may report side effects to FDA at 1-800-FDA-1088. Where should I keep my medicine? Keep out of the reach of children and pets. Store at room temperature between 15 and 30 degrees C (59 and 86 degrees F). Get rid of any unused medicine after the expiration date. To get rid of medicines that are no longer needed or have expired:  Take the medicine to a medicine take-back program. Check with your pharmacy or law enforcement to find a location.  If you cannot return the medicine, check the label or package insert to see if the medicine should be thrown out in the garbage or flushed down the toilet. If you are not sure, ask your health care provider. If it is safe to put it in the trash,  take the medicine out of the container. Mix the medicine with cat litter, dirt, coffee grounds, or other unwanted substance. Seal the mixture in a bag or container. Put it in the trash. NOTE: This sheet is a summary. It may not cover all possible information. If you have questions about this medicine, talk to your doctor, pharmacist, or health care provider.  2021 Elsevier/Gold Standard (2020-05-04  10:01:45)   Fluconazole tablets What is this medicine? FLUCONAZOLE (floo KON na zole) is an antifungal medicine. It is used to treat certain kinds of fungal or yeast infections. This medicine may be used for other purposes; ask your health care provider or pharmacist if you have questions. COMMON BRAND NAME(S): Diflucan What should I tell my health care provider before I take this medicine? They need to know if you have any of these conditions:  irregular heartbeat or rhythm  kidney disease  liver disease  low levels of potassium in the blood  an unusual or allergic reaction to fluconazole, other azole antifungals, medicines, foods, dyes, or preservatives  pregnant or trying to get pregnant  breast-feeding How should I use this medicine? Take this medicine by mouth. Follow the directions on the prescription label. Do not take your medicine more often than directed. Talk to your pediatrician regarding the use of this medicine in children. Special care may be needed. This medicine has been used in children as young as 45 months of age. Overdosage: If you think you have taken too much of this medicine contact a poison control center or emergency room at once. NOTE: This medicine is only for you. Do not share this medicine with others. What if I miss a dose? If you miss a dose, take it as soon as you can. If it is almost time for your next dose, take only that dose. Do not take double or extra doses. What may interact with this medicine? Do not take this medicine with any of the following medications:  flibanserin  lomitapide  lonafarnib  other medicines that prolong the QT interval (cause an abnormal heart rhythm)  triazolam This medicine may also interact with the following medications:  certain antibiotics like rifabutin, rifampin  certain antivirals for HIV or hepatitis  certain medicines for blood pressure, heart disease, irregular heartbeat  certain medicines for  cholesterol like atorvastatin, lovastatin, and simvastatin  certain medicines for depression, like amitriptyline, nortriptyline  certain medicines for diabetes like glipizide or glyburide  certain medicines for seizures like carbamazepine, phenytoin  certain medicines that treat or prevent blood clots like warfarin  certain narcotic medicines for pain like alfentanil, fentanyl, methadone  cyclophosphamide  cyclosporine  ibrutinib  lemborexant  midazolam  NSAIDS, medicines for pain and inflammation, like ibuprofen or naproxen  olaparib  sirolimus  steroid medicines like prednisone  tacrolimus  theophylline  tofacitinib  tolvaptan  vinblastine  vincristine  vitamin A  voriconazole This list may not describe all possible interactions. Give your health care provider a list of all the medicines, herbs, non-prescription drugs, or dietary supplements you use. Also tell them if you smoke, drink alcohol, or use illegal drugs. Some items may interact with your medicine. What should I watch for while using this medicine? Visit your doctor or health care professional for regular checkups. If you are taking this medicine for a long time you may need blood work. Tell your doctor if your symptoms do not improve. Some fungal infections need many weeks or months of treatment to cure. Alcohol can increase possible  damage to your liver. Avoid alcoholic drinks. If you have a vaginal infection, do not have sex until you have finished your treatment. You can wear a sanitary napkin. Do not use tampons. Wear freshly washed cotton, not synthetic, panties. What side effects may I notice from receiving this medicine? Side effects that you should report to your doctor or health care professional as soon as possible:  allergic reactions like skin rash or itching, hives, swelling of the lips, mouth, tongue, or throat  dark urine  feeling dizzy or faint  irregular heartbeat or chest  pain  redness, blistering, peeling or loosening of the skin, including inside the mouth  trouble breathing  unusual bruising or bleeding  vomiting  yellowing of the eyes or skin Side effects that usually do not require medical attention (report to your doctor or health care professional if they continue or are bothersome):  changes in how food tastes  diarrhea  headache  stomach upset or nausea This list may not describe all possible side effects. Call your doctor for medical advice about side effects. You may report side effects to FDA at 1-800-FDA-1088. Where should I keep my medicine? Keep out of the reach of children. Store at room temperature below 30 degrees C (86 degrees F). Throw away any medicine after the expiration date. NOTE: This sheet is a summary. It may not cover all possible information. If you have questions about this medicine, talk to your doctor, pharmacist, or health care provider.  2021 Elsevier/Gold Standard (2020-08-30 08:51:42)   Boric Acid vaginal suppository What is this medicine? BORIC ACID (BOHR ik AS id) helps to promote the proper acid balance in the vagina. It is used to help treat yeast infections of the vagina and relieve symptoms such as itching and burning. This medicine may be used for other purposes; ask your health care provider or pharmacist if you have questions. COMMON BRAND NAME(S): AZO Boric Acid with Aloe Vera, Hylafem What should I tell my health care provider before I take this medicine? They need to know if you have any of these conditions:  diabetes  frequent infections  HIV or AIDS  immune system problems  an unusual or allergic reaction to boric acid, other medicines, foods, dyes, or preservatives  pregnant or trying to get pregnant  breast-feeding How should I use this medicine? This medicine is for use in the vagina. Do not take by mouth. Follow the directions on the prescription label. Read package directions  carefully before using. Wash hands before and after use. Use this medicine at bedtime, unless otherwise directed by your doctor. Do not use your medicine more often than directed. Do not stop using this medicine except on your doctor's advice. Talk to your pediatrician regarding the use of this medicine in children. This medicine is not approved for use in children. Overdosage: If you think you have taken too much of this medicine contact a poison control center or emergency room at once. NOTE: This medicine is only for you. Do not share this medicine with others. What if I miss a dose? If you miss a dose, use it as soon as you can. If it is almost time for your next dose, use only that dose. Do not use double or extra doses. What may interact with this medicine? Interactions are not expected. Do not use any other vaginal products without telling your doctor or health care professional. This list may not describe all possible interactions. Give your health care provider a  list of all the medicines, herbs, non-prescription drugs, or dietary supplements you use. Also tell them if you smoke, drink alcohol, or use illegal drugs. Some items may interact with your medicine. What should I watch for while using this medicine? Tell your doctor or health care professional if your symptoms do not start to get better within a few days. It is better not to have sex until you have finished your treatment. This medicine may damage condoms or diaphragms and cause them not to work properly. It may also decrease the effect of vaginal spermicides. Do not rely on any of these methods to prevent sexually transmitted diseases or pregnancy while you are using this medicine. Vaginal medicines usually will come out of the vagina during treatment. To keep the medicine from getting on your clothing, wear a panty liner. The use of tampons is not recommended. To help clear up the infection, wear freshly washed cotton, not synthetic,  underwear. What side effects may I notice from receiving this medicine? Side effects that you should report to your doctor or health care professional as soon as possible:  allergic reactions like skin rash, itching or hives  vaginal irritation, redness, or burning Side effects that usually do not require medical attention (report to your doctor or health care professional if they continue or are bothersome):  vaginal discharge This list may not describe all possible side effects. Call your doctor for medical advice about side effects. You may report side effects to FDA at 1-800-FDA-1088. Where should I keep my medicine? Keep out of the reach of children. Store in a cool, dry place between 15 and 30 degrees C (59 and 86 degrees F). Keep away from sunlight. Throw away any unused medicine after the expiration date. NOTE: This sheet is a summary. It may not cover all possible information. If you have questions about this medicine, talk to your doctor, pharmacist, or health care provider.  2021 Elsevier/Gold Standard (2015-11-23 07:29:58)

## 2020-12-26 ENCOUNTER — Ambulatory Visit (INDEPENDENT_AMBULATORY_CARE_PROVIDER_SITE_OTHER): Payer: No Typology Code available for payment source | Admitting: Family Medicine

## 2020-12-26 ENCOUNTER — Other Ambulatory Visit: Payer: Self-pay

## 2020-12-26 ENCOUNTER — Encounter: Payer: Self-pay | Admitting: Family Medicine

## 2020-12-26 ENCOUNTER — Ambulatory Visit: Payer: Self-pay

## 2020-12-26 DIAGNOSIS — M542 Cervicalgia: Secondary | ICD-10-CM

## 2020-12-26 DIAGNOSIS — E559 Vitamin D deficiency, unspecified: Secondary | ICD-10-CM

## 2020-12-26 NOTE — Progress Notes (Signed)
Office Visit Note   Patient: Lori Mcgee           Date of Birth: 08/12/1986           MRN: [id number] Visit Date: 12/26/2020 Requested by: Doreene Burke, CNM [address] [place],  [place] [address] PCP: Doreene Nest, NP  Subjective: Chief Complaint  Patient presents with  . Other    Pain in left side of neck, left clavicle and ribs x almost 2 months. NKI    HPI: 35yo F presenting to clinic with concerns of intermittent chronic neck pain x several years, as well as 2 months of left pectoralis pain. Patient states that her neck has bothered her for years, despite adherence to physical therapy, chiropractic care, dry needling. She says that, when her pain is bad, it will radiate down the left side of her neck and wrap around her clavicle. It will also, not as frequently, shoot down her arm. She says when her pain is bad she does feel a little weaker on the left arm compared to the right, though this is not constant. No numbness or tingling.  Per her chest wall pain, patient says that she was doing push ups and chest flies, and felt some swelling and soreness in her left pec. The next day, she woke up with significant pain along the anterior midaxillary line. Initially, she was scared it was breast tissue, and she went to her OBGYN for evaluation. They gave her reassurance that the breast itself looked fine. She then saw her PT for dry needling, which she says helped, but her pain remains prohibitive from doing any pec exercises. She has not noticed any bruising, and no new asymmetry in her breast height. She says even running will significantly worsen her pain. No shortness of breath, no chest pain.               ROS:   All other systems were reviewed and are negative.  Objective: Vital Signs: LMP 11/27/2020 (Approximate)   Physical Exam:  General:  Alert and oriented, in no acute distress. Pulm:  Breathing unlabored. Psy:  Normal mood, congruent affect. Skin:   Left chest wall with no bruising, no rashes, no erythema.   Neck/shoulder exam: Normal Gait.  Normal Spinal curvature, without excessive lumbar lordosis, thoracic kyphosis, or scoliosis.  ROM: Full ROM in neck and left shoulder without pain.  Palpation: Significant tenderness within left cervical paraspinal pillars. Tenderness within superior trapezius, as well as rhomboids. No midline tenderness, no deformity or step-offs.  Sperlings does cause worsening pain in left aspect of neck/shoulder.   Strength testing:  5 out of 5 strength with shoulder abduction (C5), wrist extension (C6), wrist flexion (C7), grip strength (C8), and finger abduction (T1).  Sensation: Intact to light touch medial and lateral aspects of upper extremities/hands.  Chest Wall: No bruising, no swelling. Tenderness to palpation within pec major, with multiple tender points.   Imaging: Limited Ultrasound: Left Pectoralis Pectoralis tendon intact at insertion point on upper humerus, with no significant surrounding fluid. Muscle mass of pectoralis itself with no significant hypo or hyperechoic changes.  Impresison: Normal left pectoralis   Assessment & Plan: 35yo F presenting to clinic with concerns of chronic left neck/shoulder pain ongoing for several years, as well as two months of left chest wall pain.   Neck/shoulder Pain: Concern for possible radiculopathy vs trigger points as a source of her discomfort. Given failure of conservative care to sate,  will order MRI r/o true cervical pathology as a contributing factor.   Pectoralis Strain: No obvious pectoralis tear on [place]. Will start conservative treatment with eccentric pec exercises, and send a note to her PT with exercise recommendations. Anticipate this should recover well with directed rehab.  Patient expresses understanding with plan. She had no further questions or concerns today.      Procedures: No procedures performed        PMFS History: Patient  Active Problem List   Diagnosis Date Noted  . Arthralgia of left acromioclavicular joint 12/11/2020  . Chronic neck pain 12/11/2020  . Overweight (BMI 25.0-29.9) 11/24/2020  . Dizziness 11/24/2020  . Abdominal pain 05/30/2020  . Acute hip pain 07/19/2019  . Rash/skin eruption 07/16/2019  . Preventative health care 04/16/2018   Past Medical History:  Diagnosis Date  . BV (bacterial vaginosis)     Family History  Problem Relation Age of Onset  . Diabetes Father   . Hypertension Father   . Hypothyroidism Father   . Hypertension Mother   . Breast cancer Paternal Aunt        late 69's or early 82's    Past Surgical History:  Procedure Laterality Date  . HYSTEROSCOPY N/A 10/31/2016   Procedure: HYSTEROSCOPY WITH IUD REMOVAL;  Surgeon: Hildred Laser, MD;  Location: [facility] ORS;  Service: Gynecology;  Laterality: N/A;  . iud extraction  10/31/2016  . WISDOM TOOTH EXTRACTION     Social History   Occupational History  . Not on file  Tobacco Use  . Smoking status: Never Smoker  . Smokeless tobacco: Never Used  Vaping Use  . Vaping Use: Never used  Substance and Sexual Activity  . Alcohol use: No  . Drug use: No  . Sexual activity: Yes    Birth control/protection: None

## 2020-12-26 NOTE — Progress Notes (Signed)
I saw and examined the patient with Dr. Marga Hoots and agree with assessment and plan as outlined.    Left pectoralis pain for a few months.  No tear seen on ultrasound today.  Will try strengthening in PT.  Chronic left-sided neck pain.  Will order MRI.  Will treat vitamin D deficiency as well.

## 2021-01-01 ENCOUNTER — Ambulatory Visit: Payer: No Typology Code available for payment source | Admitting: Physical Therapy

## 2021-01-02 ENCOUNTER — Ambulatory Visit: Payer: No Typology Code available for payment source | Attending: Primary Care | Admitting: Physical Therapy

## 2021-01-02 ENCOUNTER — Other Ambulatory Visit: Payer: Self-pay

## 2021-01-02 DIAGNOSIS — R202 Paresthesia of skin: Secondary | ICD-10-CM | POA: Diagnosis present

## 2021-01-02 DIAGNOSIS — M25551 Pain in right hip: Secondary | ICD-10-CM

## 2021-01-02 DIAGNOSIS — M542 Cervicalgia: Secondary | ICD-10-CM | POA: Diagnosis present

## 2021-01-02 DIAGNOSIS — M6281 Muscle weakness (generalized): Secondary | ICD-10-CM | POA: Diagnosis present

## 2021-01-02 DIAGNOSIS — M546 Pain in thoracic spine: Secondary | ICD-10-CM

## 2021-01-02 DIAGNOSIS — R29898 Other symptoms and signs involving the musculoskeletal system: Secondary | ICD-10-CM | POA: Diagnosis present

## 2021-01-02 NOTE — Therapy (Signed)
[facility] [address], [place], [address] Phone: [phone]   Fax:  [phone]  Physical Therapy Treatment  Patient Details  Name: Lori Mcgee MRN: [id number] Date of Birth: Aug 08, 1986 Referring Provider (PT): Pleas Koch, NP   Encounter Date: 01/02/2021   PT End of Session - 01/02/21 1826    Visit Number 8    Number of Visits 24    Date for PT Re-Evaluation 01/30/21    Authorization Type [place] [employer] reporting period from 11/07/2020    Progress Note Due on Visit 10    PT Start Time 1826    PT Stop Time 1856    PT Time Calculation (min) 30 min    Activity Tolerance Patient tolerated treatment well    Behavior During Therapy [facility] for tasks assessed/performed           Past Medical History:  Diagnosis Date  . BV (bacterial vaginosis)     Past Surgical History:  Procedure Laterality Date  . HYSTEROSCOPY N/A 10/31/2016   Procedure: HYSTEROSCOPY WITH IUD REMOVAL;  Surgeon: Rubie Maid, MD;  Location: [facility] ORS;  Service: Gynecology;  Laterality: N/A;  . iud extraction  10/31/2016  . WISDOM TOOTH EXTRACTION      There were no vitals filed for this visit.   Subjective Assessment - 01/02/21 1828    Subjective Patient reports she got a new job and feels a lot less stressed. She has been doing her cervical spine retraction and it is helping. She now only hurts in the left upper trap now and it feels tight now (2/10 currently). She stopped using her pecs completely and her pain is gone and she is going to wait a little longer before she starts doing anything for this. She saw Dr. Junius Roads (orthopedic) who sent a messag to PT "Would you please try eccentric pec strengthening with [name]?  Thanks!" and talked ot patient abou this. Her hip has really been botherin her. She has increased running to 12 miles over the past week (4 today and the longest she has run in a long time). Felt good while she  was running. Now the right glute feels "locked up" but did not hurt during running. Currently 4/10 pain. She has been doing planks and mountain climbers.    Pertinent History Patient is a 35 y.o. female who presents to outpatient physical therapy with a referral for medical diagnosis acute pain of right hip. This patient's chief complaints consist of right hip pain near glute med leading to the following functional deficits: constant pain is distracting and it restricts her workouts and running activities, disrupts sleep and prolonged sitting, traveling, working. .  Relevant past medical history and comorbidities include history of back pain (episode as a youth and last year) and neck pain, R hip pain with two prior episodes in the past two years.    Currently in Pain? Yes             TREATMENT:   Manual therapy: to reduce pain and tissue tension, improve range of motion, neuromodulation, in order to promote improved ability to complete functional activities. - sidelying STM to right glute med, glute max, deep gluteal region to decrease pain. Most tender along the glute med.   Modality: (unbilled) Dry needling performed to right lateral hip and glute region to decrease pain and spasms along patient's posterior/lateral hip and glute region with patient in sidelying utilizing (1) dry needle(s) .6m  x 7m with (3) sticks at glute med and max and utilizing (1) dry needle(s) .316mx [id number] (1) stick at right piriformis. Patient educated about the risks and benefits from therapy and verbally consents to treatment.  Dry needling performed by SarEverlean AlstromnyGraylon Good, DPT who is certified in this technique.  HOME EXERCISE PROGRAM Access Code: [email or web address] URL: [email or web address] Date: 12/12/2020 Prepared by: SarRosita Keaxercises Seated Posture with Lumbar Roll Cervical Retraction with Overpressure - 4 x daily - 2 sets - 10 reps - 1 second hold - every two hours Seated Thoracic  Extension with Hands Behind Neck - 1-2 sets - 10 reps - 1 second hold Thoracic Extension Mobilization on Foam Roll - 1-2 sets - 10 reps Latissimus Mobilization on Foam Roll - up to 5 min Serratus Activation at Wall with Foam Roll - 5-3 reps - 5-20 seconds hold    PT Education - 01/02/21 1943    Education Details dry needling, exercise, anatomy and physiology of current condition, self management techniques    Person(s) Educated Patient    Methods Explanation;Demonstration;Tactile cues;Verbal cues    Comprehension Verbalized understanding;Returned demonstration;Verbal cues required;Tactile cues required;Need further instruction            PT Short Term Goals - 12/11/20 2054      PT SHORT TERM GOAL #1   Title Be independent with initial home exercise program for self-management of symptoms.    Baseline initial HEP provided at IE (11/07/2020);    Time 2    Period Weeks    Status Revised    Target Date 12/25/20             PT Long Term Goals - 12/11/20 2049      PT LONG TERM GOAL #1   Title Demonstrate improved FOTO score to equal or greater than 88 by visit # 11 to demonstrate improvement in overall condition and self-reported functional ability.    Baseline 77 (11/07/2020);    Time 12    Period Weeks    Status On-going   TARGET DATE FOR ALL LONG TERM GOALS: 01/30/2021     PT LONG TERM GOAL #2   Title Patient will be able to sleep through the night without disruption from R hip pain or neck/axillary pain.    Baseline currently disrupts slee (11/07/2020);  disrupts sleep 12/11/2020);    Time 12    Period Weeks    Status Revised      PT LONG TERM GOAL #3   Title Patient will report equal or less than 1/10 pain with functional activities to all her to complete her usual activities such as running, sitting, working out, reaching, turning head with less difficulty.    Baseline up to 8/10 (11/07/2020);    Time 12    Period Weeks    Status Revised      PT LONG TERM GOAL #4   Title  Complete community, work and/or recreational activities without limitation due to current condition.    Baseline Functional Limitations: constant pain is distracting and it restricts her workouts and running activities, disrupts sleep and prolonged sitting, traveling, working (11/08/2019); also using left UE, unable to complete usual workouts, constant disruption of concentration, difficulty looking left, looking up, moving neck, sleeping, etc without difficulty (12/11/2020);    Time 12    Period Weeks    Status Revised      PT LONG TERM GOAL #5   Title Be independent with a long-term home exercise  program for self-management of symptoms.    Baseline initial HEP provided at IE (11/07/2020);    Time 12    Period Weeks    Status Partially Met                 Plan - 01/02/21 1942    Clinical Impression Statement Patient tolerated treatment well overall with significant twitch response to dry needling over right glute med where she was most tender. Reported being sore by end of session, which is a common initial response to needling for her. Continued with STM to the same regions to decrease pain and spasm. Plan to continue addressing patient's impairments and functional limitations at subsequent visits as appropriate. Did spend time addressing need for structured return to use of L pec when patient feels ready to resume increased load. Patient would benefit from continued management of limiting condition by skilled physical therapist to address remaining impairments and functional limitations to work towards stated goals and return to PLOF or maximal functional independence.    Personal Factors and Comorbidities Age;Fitness;Past/Current Experience;Comorbidity 2;Behavior Pattern    Comorbidities history of low back pain (episode as a youth and last year) and neck pain, R hip pain with two prior episodes in the past two years.    Examination-Activity Limitations Sleep;Sit;Squat;Stairs;Reach  Overhead;Carry;Dressing    Examination-Participation Restrictions Interpersonal Relationship;Driving;Community Activity;Occupation;Meal Prep;Yard Work;Cleaning   using left UE, unable to complete usual workouts, constant disruption of concentration, difficulty looking left, looking up, moving neck, sleeping, etc without difficulty.   Stability/Clinical Decision Making Evolving/Moderate complexity    Rehab Potential Good    PT Frequency Other (comment)    PT Duration 12 weeks    PT Treatment/Interventions ADLs/Self Care Home Management;Cryotherapy;Electrical Stimulation;Moist Heat;Therapeutic activities;Therapeutic exercise;Balance training;Neuromuscular re-education;Patient/family education;Manual techniques;Dry needling;Passive range of motion;Spinal Manipulations;Joint Manipulations;Aquatic Therapy;Biofeedback;Traction;Taping    PT Next Visit Plan exercises, manual, dry needling.    PT Home Exercise Plan Medbridge Access Code: [email or web address]    Consulted and Agree with Plan of Care Patient           Patient will benefit from skilled therapeutic intervention in order to improve the following deficits and impairments:  Increased muscle spasms,Pain,Decreased activity tolerance,Decreased endurance,Decreased mobility,Difficulty walking,Impaired perceived functional ability,Postural dysfunction,Hypermobility,Decreased coordination,Decreased balance,Impaired sensation,Decreased range of motion,Decreased strength,Impaired flexibility  Visit Diagnosis: Pain in right hip  Muscle weakness (generalized)  Cervicalgia  Pain in thoracic spine  Other symptoms and signs involving the musculoskeletal system  Paresthesia of skin     Problem List Patient Active Problem List   Diagnosis Date Noted  . Vitamin D deficiency 12/26/2020  . Arthralgia of left acromioclavicular joint 12/11/2020  . Chronic neck pain 12/11/2020  . Overweight (BMI 25.0-29.9) 11/24/2020  . Dizziness 11/24/2020  . Abdominal pain  05/30/2020  . Acute hip pain 07/19/2019  . Rash/skin eruption 07/16/2019  . Preventative health care 04/16/2018    Everlean Alstrom. Graylon Good, PT, DPT 01/02/21, 7:45 PM  [facility] [address] [place], [address] Phone: [phone]   Fax:  [phone]  Name: Lori Mcgee MRN: [id number] Date of Birth: 22-Aug-1986

## 2021-01-09 ENCOUNTER — Ambulatory Visit: Payer: No Typology Code available for payment source | Admitting: Physical Therapy

## 2021-01-09 ENCOUNTER — Telehealth: Payer: Self-pay | Admitting: Physical Therapy

## 2021-01-09 NOTE — Telephone Encounter (Signed)
Called patient when she did not come to her appointment today at 5:30pm. No answer. Left HIPAA compliant VM requesting call back.   Lori Mcgee. Ilsa Iha, PT, DPT 01/09/21, 5:57 PM

## 2021-01-11 ENCOUNTER — Ambulatory Visit: Payer: No Typology Code available for payment source | Admitting: Physical Therapy

## 2021-01-15 ENCOUNTER — Encounter: Payer: No Typology Code available for payment source | Admitting: Physical Therapy

## 2021-01-16 ENCOUNTER — Ambulatory Visit: Payer: No Typology Code available for payment source | Admitting: Physical Therapy

## 2021-01-26 ENCOUNTER — Encounter: Payer: No Typology Code available for payment source | Admitting: Certified Nurse Midwife

## 2021-02-01 ENCOUNTER — Encounter: Payer: Self-pay | Admitting: Certified Nurse Midwife

## 2021-02-01 ENCOUNTER — Ambulatory Visit (INDEPENDENT_AMBULATORY_CARE_PROVIDER_SITE_OTHER): Payer: No Typology Code available for payment source | Admitting: Certified Nurse Midwife

## 2021-02-01 ENCOUNTER — Other Ambulatory Visit: Payer: Self-pay

## 2021-02-01 VITALS — BP 131/81 | HR 66 | Resp 16 | Ht 70.0 in | Wt 193.0 lb

## 2021-02-01 DIAGNOSIS — N6325 Unspecified lump in the left breast, overlapping quadrants: Secondary | ICD-10-CM | POA: Diagnosis not present

## 2021-02-01 DIAGNOSIS — N644 Mastodynia: Secondary | ICD-10-CM

## 2021-02-01 DIAGNOSIS — Z87898 Personal history of other specified conditions: Secondary | ICD-10-CM | POA: Diagnosis not present

## 2021-02-01 NOTE — Progress Notes (Signed)
GYN ENCOUNTER NOTE  Subjective:       Lori Mcgee is a 35 y.o. G80P3003 female is here for gynecologic evaluation of the following issues:  1. Breast pain  Previously seen in office for same concern on 12/11/2020; for further details, please see previous note. No relief with home treatment measures.   History significant for fibrocystic breast changes. Reports pain in different from premenstrual discomfort.   Denies difficulty breathing or respiratory distress, chest pain, abdominal pain, excessive vaginal bleeding, dysuria, and leg pain or swelling.    Gynecologic History  No LMP recorded.  Contraception: none  Last Pap: 05/2017. Results were: normal  Last mammogram: 05/2019. Results were; BI-RADS 3, followed by normal ultrasound  Obstetric History  OB History  Gravida Para Term Preterm AB Living  3 3 3  0 0 3  SAB IAB Ectopic Multiple Live Births  0 0 0   3    # Outcome Date GA Lbr Len/2nd Weight Sex Delivery Anes PTL Lv  3 Term 11/23/13   7 lb 11 oz (3.487 kg) M Vag-Spont  N LIV  2 Term 11/01/11   6 lb 1 oz (2.75 kg) F Vag-Spont  N LIV  1 Term 02/03/09   8 lb 8 oz (3.856 kg) M Vag-Spont  N LIV    Past Medical History:  Diagnosis Date  . BV (bacterial vaginosis)     Past Surgical History:  Procedure Laterality Date  . HYSTEROSCOPY N/A 10/31/2016   Procedure: HYSTEROSCOPY WITH IUD REMOVAL;  Surgeon: 11/02/2016, MD;  Location: [facility] ORS;  Service: Gynecology;  Laterality: N/A;  . iud extraction  10/31/2016  . WISDOM TOOTH EXTRACTION      No Known Allergies  Social History   Socioeconomic History  . Marital status: Married    Spouse name: Not on file  . Number of children: Not on file  . Years of education: Not on file  . Highest education level: Not on file  Occupational History  . Not on file  Tobacco Use  . Smoking status: Never Smoker  . Smokeless tobacco: Never Used  Vaping Use  . Vaping Use: Never used  Substance and Sexual Activity  .  Alcohol use: No  . Drug use: No  . Sexual activity: Yes    Birth control/protection: None  Other Topics Concern  . Not on file  Social History Narrative   Married.   3 children.   Works in 11/02/2016.    Enjoys exercising and playing basketball.     Social Determinants of Health   Financial Resource Strain: Not on file  Food Insecurity: Not on file  Transportation Needs: Not on file  Physical Activity: Not on file  Stress: Not on file  Social Connections: Not on file  Intimate Partner Violence: Not on file    Family History  Problem Relation Age of Onset  . Diabetes Father   . Hypertension Father   . Hypothyroidism Father   . Hypertension Mother   . Breast cancer Paternal Aunt        late 53's or early 60's    The following portions of the patient's history were reviewed and updated as appropriate: allergies, current medications, past family history, past medical history, past social history, past surgical history and problem list.  Review of Systems  ROS negative except as noted above. Information obtained from patient.   Objective:   BP 131/81   Pulse 66   Resp 16   Ht  5\' 10"  (1.778 m)   Wt 193 lb (87.5 kg)   BMI 27.69 kg/m   CONSTITUTIONAL: Well-developed, well-nourished female in no acute distress.   BREASTS:    Right breast normal without mass, skin or nipple changes or axillary nodes   Left breast normal without skin or nipple changes except for small, slim, mobile lump at six (6) o'clock [size of an Advil]   Assessment:   1. Breast pain, left  - BREAST LTD UNI LEFT INC AXILLA; Future - MM Digital Diagnostic Bilat; Future  2. History of fibrocystic disease of breast  - US BREAST LTD UNI LEFT INC AXILLA; Future - MM Digital Diagnostic Bilat; Future  3. Breast lump on left side at 6 o'clock position  - MM Digital Diagnostic Bilat; Future     Plan:   Findings discussed with patient, would like to proceed with  ultrasound and diagnostic mammogram.  Breast ultrasound and diagnostic mammogram ordered, see chart.   Encouraged vitamin e supplement or evening primrose oil.   Reviewed red flag symptoms and when to call.   RTC as previously scheduled or sooner if needed.    [place], CNM [facility], [facility] 02/01/21 4:32 PM

## 2021-02-01 NOTE — Patient Instructions (Addendum)
Breast Tenderness Breast tenderness is a common problem for women of all ages, but may also occur in men. Breast tenderness may range from mild discomfort to severe pain. In women, the pain usually comes and goes with the menstrual cycle, but it can also be constant. Breast tenderness has many possible causes, including hormone changes, infections, and taking certain medicines. You may have tests, such as a mammogram or an ultrasound, to check for any unusual findings. Having breast tenderness usually does not mean that you have breast cancer. Follow these instructions at home: Managing pain and discomfort  If directed, put ice to the painful area. To do this: ? Put ice in a plastic bag. ? Place a towel between your skin and the bag. ? Leave the ice on for 20 minutes, 2-3 times a day.  Wear a supportive bra, especially during exercise. You may also want to wear a supportive bra while sleeping if your breasts are very tender.   Medicines  Take over-the-counter and prescription medicines only as told by your health care provider. If the cause of your pain is infection, you may be prescribed an antibiotic medicine.  If you were prescribed an antibiotic, take it as told by your health care provider. Do not stop taking the antibiotic even if you start to feel better. Eating and drinking  Your health care provider may recommend that you lessen the amount of fat in your diet. You can do this by: ? Limiting fried foods. ? Cooking foods using methods such as baking, boiling, grilling, and broiling.  Decrease the amount of caffeine in your diet. Instead, drink more water and choose caffeine-free drinks. General instructions  Keep a log of the days and times when your breasts are most tender.  Ask your health care provider how to do breast exams at home. This will help you notice if you have an unusual growth or lump.  Keep all follow-up visits as told by your health care provider. This is  important.   Contact a health care provider if:  Any part of your breast is hard, red, and hot to the touch. This may be a sign of infection.  You are a woman and: ? Not breastfeeding and you have fluid, especially blood or pus, coming out of your nipples. ? Have a new or painful lump in your breast that remains after your menstrual period ends.  You have a fever.  Your pain does not improve or it gets worse.  Your pain is interfering with your daily activities. Summary  Breast tenderness may range from mild discomfort to severe pain.  Breast tenderness has many possible causes, including hormone changes, infections, and taking certain medicines.  It can be treated with ice, wearing a supportive bra, and medicines.  Make changes to your diet if told to by your health care provider, VITAMIN E SUPPLEMENT OR EVENING PRIMROSE OIL This information is not intended to replace advice given to you by your health care provider. Make sure you discuss any questions you have with your health care provider. Document Revised: 03/15/2019 Document Reviewed: 03/15/2019 Elsevier Patient Education  2021 [employer].

## 2021-02-02 ENCOUNTER — Other Ambulatory Visit: Payer: Self-pay

## 2021-02-02 DIAGNOSIS — N644 Mastodynia: Secondary | ICD-10-CM

## 2021-02-02 DIAGNOSIS — N6325 Unspecified lump in the left breast, overlapping quadrants: Secondary | ICD-10-CM

## 2021-02-07 ENCOUNTER — Ambulatory Visit
Admission: RE | Admit: 2021-02-07 | Discharge: 2021-02-07 | Disposition: A | Discharge status: Home / Self Care | Payer: 59 | Source: Ambulatory Visit | Attending: Obstetrics and Gynecology | Admitting: Obstetrics and Gynecology

## 2021-02-07 ENCOUNTER — Other Ambulatory Visit: Payer: Self-pay

## 2021-02-07 ENCOUNTER — Ambulatory Visit
Admission: RE | Admit: 2021-02-07 | Discharge: 2021-02-07 | Disposition: A | Discharge status: Home / Self Care | Payer: 59 | Source: Ambulatory Visit | Attending: Certified Nurse Midwife | Admitting: Certified Nurse Midwife

## 2021-02-07 DIAGNOSIS — N6325 Unspecified lump in the left breast, overlapping quadrants: Secondary | ICD-10-CM | POA: Diagnosis present

## 2021-02-07 DIAGNOSIS — Z87898 Personal history of other specified conditions: Secondary | ICD-10-CM | POA: Insufficient documentation

## 2021-02-07 DIAGNOSIS — N644 Mastodynia: Secondary | ICD-10-CM | POA: Insufficient documentation

## 2021-03-19 ENCOUNTER — Ambulatory Visit: Payer: No Typology Code available for payment source | Admitting: Family Medicine

## 2021-03-22 ENCOUNTER — Ambulatory Visit (INDEPENDENT_AMBULATORY_CARE_PROVIDER_SITE_OTHER): Payer: 59 | Admitting: Family Medicine

## 2021-03-22 ENCOUNTER — Other Ambulatory Visit: Payer: Self-pay

## 2021-03-22 VITALS — BP 82/62 | HR 74 | Temp 97.9°F | Ht 69.5 in | Wt 189.5 lb

## 2021-03-22 DIAGNOSIS — B882 Other arthropod infestations: Secondary | ICD-10-CM | POA: Diagnosis not present

## 2021-03-22 DIAGNOSIS — R1012 Left upper quadrant pain: Secondary | ICD-10-CM

## 2021-03-22 DIAGNOSIS — M546 Pain in thoracic spine: Secondary | ICD-10-CM | POA: Diagnosis not present

## 2021-03-22 HISTORY — DX: Other arthropod infestations: B88.2

## 2021-03-22 NOTE — Patient Instructions (Addendum)
#  Back pain - I think this is muscle related - try heat - ibuprofen, massage, voltaren gel on the area  #Abdominal pain - This may be muscle but I'm also wondering if you have a small hernia - CT scan to evaluate for hernia but also rule out other causes  #Fatigue - if this does not improve after you finish doxycycline follow-up with Jae Dire

## 2021-03-22 NOTE — Assessment & Plan Note (Signed)
Reviewed visit with pcp from 05/2020 symptoms x 1 week at that time with constipation on KUB. Symptoms now persisting 1 year and worse in the last month. Denies constipation. Discussed getting CT for possible hernia evaluation and to rule out other possible cause as no palpable hernia on exam. She will schedule on her period to ensure no pregnancy. Discussed if negative suspect it could be MSK related and would advise PT to see if symptoms improve.

## 2021-03-22 NOTE — Progress Notes (Signed)
Subjective:     Lori Mcgee is a 35 y.o. female presenting for Abdominal Pain (LUQ behind rib on and off, steadily x 1 month) and Back Pain (L thoracic side x 1 month )     Abdominal Pain This is a new problem. The current episode started more than 1 year ago. The problem occurs intermittently. The problem has been gradually worsening. The pain is located in the LUQ. The pain is moderate. The abdominal pain radiates to the periumbilical region. Associated symptoms include anorexia, headaches and nausea. Pertinent negatives include no constipation, diarrhea, frequency, hematochezia, hematuria, melena, myalgias, vomiting or weight loss. Associated symptoms comments: Greasy foods. The pain is aggravated by movement (lifting). The pain is relieved by nothing. She has tried nothing for the symptoms.  Back Pain This is a new problem. The current episode started more than 1 month ago. The problem occurs constantly. The pain is present in the thoracic spine. Quality: sharp. The pain does not radiate. Associated symptoms include abdominal pain and headaches. Pertinent negatives include no numbness, paresthesias, tingling, weakness or weight loss. (Shoulder/neck pain and not sure if related)   Notes tick bite last week Is on doxycycline due tick bite for treatment  Steady decrease in running tolerance  Review of Systems  Constitutional: Negative for weight loss.  Gastrointestinal: Positive for abdominal pain, anorexia and nausea. Negative for constipation, diarrhea, hematochezia, melena and vomiting.  Genitourinary: Negative for frequency and hematuria.  Musculoskeletal: Positive for back pain. Negative for myalgias.  Neurological: Positive for headaches. Negative for tingling, weakness, numbness and paresthesias.   05/30/2020: clinic - LUQ pain - KUB with constipation and 3 mm kidney stone, lipase normal  Repeat labs 11/24/20 normal cmp, cbc  Social History   Tobacco Use  Smoking Status  Never Smoker  Smokeless Tobacco Never Used        Objective:    BP Readings from Last 3 Encounters:  03/22/21 (!) 82/62  02/01/21 131/81  12/22/20 113/78   Wt Readings from Last 3 Encounters:  03/22/21 189 lb 8 oz (86 kg)  02/01/21 193 lb (87.5 kg)  12/22/20 187 lb 14.4 oz (85.2 kg)    BP (!) 82/62   Pulse 74   Temp 97.9 F (36.6 C) (Temporal)   Ht 5' 9.5" (1.765 m)   Wt 189 lb 8 oz (86 kg)   LMP 03/06/2021 (Exact Date)   SpO2 99%   BMI 27.58 kg/m    Physical Exam Constitutional:      General: She is not in acute distress.    Appearance: She is well-developed. She is not diaphoretic.  HENT:     Head: Normocephalic and atraumatic.     Right Ear: External ear normal.     Left Ear: External ear normal.     Nose: Nose normal.  Eyes:     Conjunctiva/sclera: Conjunctivae normal.  Cardiovascular:     Rate and Rhythm: Normal rate and regular rhythm.     Heart sounds: No murmur heard.   Pulmonary:     Effort: Pulmonary effort is normal. No respiratory distress.     Breath sounds: Normal breath sounds. No wheezing.  Abdominal:     General: Abdomen is flat. Bowel sounds are normal. There is no distension.     Palpations: Abdomen is soft. There is no hepatomegaly or splenomegaly.     Tenderness: There is abdominal tenderness in the left upper quadrant. There is no guarding or rebound.     Comments: Possible  increased pressure over area of concern with laughing. Though unable to palpate any defect  Musculoskeletal:     Cervical back: Neck supple.  Skin:    General: Skin is warm and dry.     Capillary Refill: Capillary refill takes less than 2 seconds.  Neurological:     Mental Status: She is alert. Mental status is at baseline.  Psychiatric:        Mood and Affect: Mood normal.        Behavior: Behavior normal.           Assessment & Plan:   Problem List Items Addressed This Visit      Other   Abdominal pain - Primary    Reviewed visit with pcp from  05/2020 symptoms x 1 week at that time with constipation on KUB. Symptoms now persisting 1 year and worse in the last month. Denies constipation. Discussed getting CT for possible hernia evaluation and to rule out other possible cause as no palpable hernia on exam. She will schedule on her period to ensure no pregnancy. Discussed if negative suspect it could be MSK related and would advise PT to see if symptoms improve.       Relevant Orders   CT Abdomen Pelvis Wo Contrast   Tick-borne disease    Currently on doxycycline for tick bite and hx of lyme disease but noting exercise intolerance, back pain, fatigue. Advised continued treatment and if symptoms worsening/not improving to see pcp.       Acute left-sided thoracic back pain    Muscle tension in area of interest. Discussed likely muscle strain/tension. Advise heat/NSAIDs/stretching.         I spent >25 minutes with pt , obtaining history, examining, reviewing chart, documenting encounter and discussing the above plan of care.   Return if symptoms worsen or fail to improve.  Lynnda Child, MD  This visit occurred during the SARS-CoV-2 public health emergency.  Safety protocols were in place, including screening questions prior to the visit, additional usage of staff PPE, and extensive cleaning of exam room while observing appropriate contact time as indicated for disinfecting solutions.

## 2021-03-22 NOTE — Assessment & Plan Note (Signed)
Muscle tension in area of interest. Discussed likely muscle strain/tension. Advise heat/NSAIDs/stretching.

## 2021-03-22 NOTE — Assessment & Plan Note (Signed)
Currently on doxycycline for tick bite and hx of lyme disease but noting exercise intolerance, back pain, fatigue. Advised continued treatment and if symptoms worsening/not improving to see pcp.

## 2021-03-29 ENCOUNTER — Other Ambulatory Visit (HOSPITAL_COMMUNITY)
Admission: RE | Admit: 2021-03-29 | Discharge: 2021-03-29 | Disposition: A | Discharge status: Home / Self Care | Payer: 59 | Source: Ambulatory Visit | Attending: Certified Nurse Midwife | Admitting: Certified Nurse Midwife

## 2021-03-29 ENCOUNTER — Other Ambulatory Visit: Payer: Self-pay

## 2021-03-29 ENCOUNTER — Encounter: Payer: Self-pay | Admitting: Certified Nurse Midwife

## 2021-03-29 ENCOUNTER — Ambulatory Visit (INDEPENDENT_AMBULATORY_CARE_PROVIDER_SITE_OTHER): Payer: 59 | Admitting: Certified Nurse Midwife

## 2021-03-29 VITALS — BP 113/70 | HR 73 | Resp 16 | Ht 69.5 in | Wt 190.9 lb

## 2021-03-29 DIAGNOSIS — R1012 Left upper quadrant pain: Secondary | ICD-10-CM | POA: Diagnosis not present

## 2021-03-29 DIAGNOSIS — Z01419 Encounter for gynecological examination (general) (routine) without abnormal findings: Secondary | ICD-10-CM | POA: Diagnosis not present

## 2021-03-29 DIAGNOSIS — Z87898 Personal history of other specified conditions: Secondary | ICD-10-CM

## 2021-03-29 DIAGNOSIS — Z124 Encounter for screening for malignant neoplasm of cervix: Secondary | ICD-10-CM | POA: Insufficient documentation

## 2021-03-29 NOTE — Patient Instructions (Signed)
Preventive Care 21-36 Years Old, Female Preventive care refers to lifestyle choices and visits with your health care provider that can promote health and wellness. This includes:  A yearly physical exam. This is also called an annual wellness visit.  Regular dental and eye exams.  Immunizations.  Screening for certain conditions.  Healthy lifestyle choices, such as: ? Eating a healthy diet. ? Getting regular exercise. ? Not using drugs or products that contain nicotine and tobacco. ? Limiting alcohol use. What can I expect for my preventive care visit? Physical exam Your health care provider may check your:  Height and weight. These may be used to calculate your BMI (body mass index). BMI is a measurement that tells if you are at a healthy weight.  Heart rate and blood pressure.  Body temperature.  Skin for abnormal spots. Counseling Your health care provider may ask you questions about your:  Past medical problems.  Family's medical history.  Alcohol, tobacco, and drug use.  Emotional well-being.  Home life and relationship well-being.  Sexual activity.  Diet, exercise, and sleep habits.  Work and work environment.  Access to firearms.  Method of birth control.  Menstrual cycle.  Pregnancy history. What immunizations do I need? Vaccines are usually given at various ages, according to a schedule. Your health care provider will recommend vaccines for you based on your age, medical history, and lifestyle or other factors, such as travel or where you work.   What tests do I need? Blood tests  Lipid and cholesterol levels. These may be checked every 5 years starting at age 20.  Hepatitis C test.  Hepatitis B test. Screening  Diabetes screening. This is done by checking your blood sugar (glucose) after you have not eaten for a while (fasting).  STD (sexually transmitted disease) testing, if you are at risk.  BRCA-related cancer screening. This may be  done if you have a family history of breast, ovarian, tubal, or peritoneal cancers.  Pelvic exam and Pap test. This may be done every 3 years starting at age 21. Starting at age 30, this may be done every 5 years if you have a Pap test in combination with an HPV test. Talk with your health care provider about your test results, treatment options, and if necessary, the need for more tests.   Follow these instructions at home: Eating and drinking  Eat a healthy diet that includes fresh fruits and vegetables, whole grains, lean protein, and low-fat dairy products.  Take vitamin and mineral supplements as recommended by your health care provider.  Do not drink alcohol if: ? Your health care provider tells you not to drink. ? You are pregnant, may be pregnant, or are planning to become pregnant.  If you drink alcohol: ? Limit how much you have to 0-1 drink a day. ? Be aware of how much alcohol is in your drink. In the [place] one drink equals one 12 oz bottle of beer (355 mL), one 5 oz glass of wine (148 mL), or one 1 oz glass of hard liquor (44 mL).   Lifestyle  Take daily care of your teeth and gums. Brush your teeth every morning and night with fluoride toothpaste. Floss one time each day.  Stay active. Exercise for at least 30 minutes 5 or more days each week.  Do not use any products that contain nicotine or tobacco, such as cigarettes, e-cigarettes, and chewing tobacco. If you need help quitting, ask your health care provider.  Do not   use drugs.  If you are sexually active, practice safe sex. Use a condom or other form of protection to prevent STIs (sexually transmitted infections).  If you do not wish to become pregnant, use a form of birth control. If you plan to become pregnant, see your health care provider for a prepregnancy visit.  Find healthy ways to cope with stress, such as: ? Meditation, yoga, or listening to music. ? Journaling. ? Talking to a trusted  person. ? Spending time with friends and family. Safety  Always wear your seat belt while driving or riding in a vehicle.  Do not drive: ? If you have been drinking alcohol. Do not ride with someone who has been drinking. ? When you are tired or distracted. ? While texting.  Wear a helmet and other protective equipment during sports activities.  If you have firearms in your house, make sure you follow all gun safety procedures.  Seek help if you have been physically or sexually abused. What's next?  Go to your health care provider once a year for an annual wellness visit.  Ask your health care provider how often you should have your eyes and teeth checked.  Stay up to date on all vaccines. This information is not intended to replace advice given to you by your health care provider. Make sure you discuss any questions you have with your health care provider. Document Revised: 06/18/2020 Document Reviewed: 07/02/2018 Elsevier Patient Education  2021 Elsevier Inc.  

## 2021-03-30 LAB — AMYLASE: Amylase: 67 U/L (ref 31–110)

## 2021-03-30 LAB — LIPASE: Lipase: 38 U/L (ref 14–72)

## 2021-03-30 NOTE — Progress Notes (Signed)
ANNUAL PREVENTATIVE CARE GYN  ENCOUNTER NOTE  Subjective:       Lori Mcgee is a 35 y.o. [id number] female here for a routine annual gynecologic exam.  Current complaints: 1. Upper left abdominal pain; CT Scan scheduled by PCP 2. Requests Pap smear  Denies difficulty breathing or respiratory distress, chest pain, dysuria, and leg pain or swelling.    Gynecologic History  Patient's last menstrual period was 03/06/2021 (exact date).  Contraception: none  Last Pap: 05/2017. Results were: normal  Last mammogram: 02/2021. Results were: BI-RADS 2  Obstetric History OB History  Gravida Para Term Preterm AB Living  3 3 3  0 0 3  SAB IAB Ectopic Multiple Live Births  0 0 0   3    # Outcome Date GA Lbr Len/2nd Weight Sex Delivery Anes PTL Lv  3 Term 11/23/13   7 lb 11 oz (3.487 kg) M Vag-Spont  N LIV  2 Term 11/01/11   6 lb 1 oz (2.75 kg) F Vag-Spont  N LIV  1 Term 02/03/09   8 lb 8 oz (3.856 kg) M Vag-Spont  N LIV    Past Medical History:  Diagnosis Date  . BV (bacterial vaginosis)     Past Surgical History:  Procedure Laterality Date  . HYSTEROSCOPY N/A 10/31/2016   Procedure: HYSTEROSCOPY WITH IUD REMOVAL;  Surgeon: 11/02/2016, MD;  Location: [facility] ORS;  Service: Gynecology;  Laterality: N/A;  . iud extraction  10/31/2016  . WISDOM TOOTH EXTRACTION      Current Outpatient Medications on File Prior to Visit  Medication Sig Dispense Refill  . doxycycline (VIBRA-TABS) 100 MG tablet Take 100 mg by mouth 2 (two) times daily.     No current facility-administered medications on file prior to visit.    No Known Allergies  Social History   Socioeconomic History  . Marital status: Married    Spouse name: Not on file  . Number of children: Not on file  . Years of education: Not on file  . Highest education level: Not on file  Occupational History  . Not on file  Tobacco Use  . Smoking status: Never Smoker  . Smokeless tobacco: Never Used  Vaping Use  . Vaping Use:  Never used  Substance and Sexual Activity  . Alcohol use: No  . Drug use: No  . Sexual activity: Yes    Birth control/protection: None  Other Topics Concern  . Not on file  Social History Narrative   Married.   3 children.   Works in 11/02[employer]2016.    Enjoys exercising and playing basketball.     Social Determinants of Health   Financial Resource Strain: Not on file  Food Insecurity: Not on file  Transportation Needs: Not on file  Physical Activity: Not on file  Stress: Not on file  Social Connections: Not on file  Intimate Partner Violence: Not on file    Family History  Problem Relation Age of Onset  . Diabetes Father   . Hypertension Father   . Hypothyroidism Father   . Hypertension Mother   . Breast cancer Paternal Aunt        late 90's or early 12's    The following portions of the patient's history were reviewed and updated as appropriate: allergies, current medications, past family history, past medical history, past social history, past surgical history and problem list.  Review of Systems  ROS negative except as noted above. Information obtained from patient.  Objective:   BP 113/70   Pulse 73   Resp 16   Ht 5' 9.5" (1.765 m)   Wt 190 lb 14.4 oz (86.6 kg)   LMP 03/06/2021 (Exact Date)   BMI 27.79 kg/m    CONSTITUTIONAL: Well-developed, well-nourished female in no acute distress.   PSYCHIATRIC: Normal mood and affect. Normal behavior. Normal judgment and thought content.  NEUROLGIC: Alert and oriented to person, place, and time. Normal muscle tone coordination. No cranial nerve deficit noted.  HENT:  Normocephalic, atraumatic.  EYES: Conjunctivae and EOM are normal.   NECK: Normal range of motion, supple, no masses.  Normal thyroid.   SKIN: Skin is warm and dry. No rash noted. Not diaphoretic. No erythema. No pallor.  CARDIOVASCULAR: Normal heart rate noted, regular rhythm, no murmur.  RESPIRATORY: Clear to auscultation  bilaterally. Effort and breath sounds normal, no problems with respiration noted.  BREASTS: Symmetric in size. Fibrocystic breast changes present.   ABDOMEN: Soft, normal bowel sounds, no distention noted. Tenderness present to left upper quadrant.   PELVIC:  External Genitalia: Normal  Vagina: Normal  Cervix: Normal, Pap collected  Uterus: Normal  Adnexa: Normal   MUSCULOSKELETAL: Normal range of motion. No tenderness.  No cyanosis, clubbing, or edema.  2+ distal pulses.  LYMPHATIC: No Axillary, Supraclavicular, or Inguinal Adenopathy.  Assessment:   Annual gynecologic examination 35 y.o.   Contraception: none   Overweight   Problem List Items Addressed This Visit   None   Visit Diagnoses    Well woman exam    -  Primary   Relevant Orders   Amylase (Completed)   Lipase (Completed)   Cytology - PAP   Screening for cervical cancer       Relevant Orders   Cytology - PAP   History of fibrocystic disease of breast       Left upper quadrant pain       Relevant Orders   Amylase (Completed)   Lipase (Completed)      Plan:   Pap: Pap Co Test  Labs: See orders  Routine preventative health maintenance measures emphasized: Exercise/Diet/Weight control, Tobacco Warnings, Alcohol/Substance use risks and Stress Management; see AVS  Reviewed red flag symptoms and when to call  Return to Clinic - 1 Year for [facility] or sooner if needed   Serafina Royals, CNM  [facility], [facility]

## 2021-04-03 LAB — CYTOLOGY - PAP
Comment: NEGATIVE
Diagnosis: NEGATIVE
High risk HPV: NEGATIVE

## 2021-04-11 ENCOUNTER — Encounter: Payer: Self-pay | Admitting: Physical Therapy

## 2021-04-11 DIAGNOSIS — M6281 Muscle weakness (generalized): Secondary | ICD-10-CM

## 2021-04-11 DIAGNOSIS — R29898 Other symptoms and signs involving the musculoskeletal system: Secondary | ICD-10-CM

## 2021-04-11 DIAGNOSIS — R202 Paresthesia of skin: Secondary | ICD-10-CM

## 2021-04-11 DIAGNOSIS — M546 Pain in thoracic spine: Secondary | ICD-10-CM

## 2021-04-11 DIAGNOSIS — M542 Cervicalgia: Secondary | ICD-10-CM

## 2021-04-11 DIAGNOSIS — M25551 Pain in right hip: Secondary | ICD-10-CM

## 2021-04-11 NOTE — Therapy (Signed)
[facility] [address] [place], [address] Phone: [phone]   Fax:  [phone]  Physical Therapy No-Visit Discharge Summary Dates of reporting: 11/07/2020 - 04/11/2021  Patient Details  Name: Lori Mcgee MRN: [id number] Date of Birth: [birth year] Referring Provider (PT): Pleas Koch, NP   Encounter Date: 04/11/2021    Past Medical History:  Diagnosis Date  . BV (bacterial vaginosis)     Past Surgical History:  Procedure Laterality Date  . HYSTEROSCOPY N/A 10/31/2016   Procedure: HYSTEROSCOPY WITH IUD REMOVAL;  Surgeon: Rubie Maid, MD;  Location: [facility] ORS;  Service: Gynecology;  Laterality: N/A;  . iud extraction  10/31/2016  . WISDOM TOOTH EXTRACTION      There were no vitals filed for this visit.   Subjective Assessment - 04/11/21 1023    Subjective Patient did not return for further sessions after her last visit 0/01/5464 and her certification period is now expired.    Pertinent History Patient is a 35 y.o. female who presents to outpatient physical therapy with a referral for medical diagnosis acute pain of right hip. This patient's chief complaints consist of right hip pain near glute med leading to the following functional deficits: constant pain is distracting and it restricts her workouts and running activities, disrupts sleep and prolonged sitting, traveling, working. .  Relevant past medical history and comorbidities include history of back pain (episode as a youth and last year) and neck pain, R hip pain with two prior episodes in the past two years.           OBJECTIVE Patient is not present for examination at this time. Please see previous documentation for latest objective data.      PT Short Term Goals - 04/11/21 1024      PT SHORT TERM GOAL #1   Title Be independent with initial home exercise program for self-management of symptoms.    Baseline initial HEP provided at  IE (11/07/2020);    Time 2    Period Weeks    Status Achieved    Target Date 12/25/20             PT Long Term Goals - 04/11/21 1025      PT LONG TERM GOAL #1   Title Demonstrate improved FOTO score to equal or greater than 88 by visit # 11 to demonstrate improvement in overall condition and self-reported functional ability.    Baseline 77 (11/07/2020);    Time 12    Period Weeks    Status On-going   TARGET DATE FOR ALL LONG TERM GOALS: 01/30/2021     PT LONG TERM GOAL #2   Title Patient will be able to sleep through the night without disruption from R hip pain or neck/axillary pain.    Baseline currently disrupts sleep (11/07/2020);  disrupts sleep 12/11/2020);    Time 12    Period Weeks    Status Partially Met      PT LONG TERM GOAL #3   Title Patient will report equal or less than 1/10 pain with functional activities to all her to complete her usual activities such as running, sitting, working out, reaching, turning head with less difficulty.    Baseline up to 8/10 (11/07/2020);    Time 12    Period Weeks    Status Partially Met      PT LONG TERM GOAL #4   Title Complete community, work and/or recreational activities without limitation due  to current condition.    Baseline Functional Limitations: constant pain is distracting and it restricts her workouts and running activities, disrupts sleep and prolonged sitting, traveling, working (11/08/2019); also using left UE, unable to complete usual workouts, constant disruption of concentration, difficulty looking left, looking up, moving neck, sleeping, etc without difficulty (12/11/2020);    Time 12    Period Weeks    Status Partially Met      PT LONG TERM GOAL #5   Title Be independent with a long-term home exercise program for self-management of symptoms.    Baseline initial HEP provided at IE (11/07/2020);    Time 12    Period Weeks    Status Partially Met             Plan - 04/11/21 1029    Clinical Impression Statement Patient  attended 8 physical therapy sessions this episode of care and was struggling to make consistent progress. Was doing better at last physical therapy session but had not shown stability in symptoms. Patient did not return to physical therapy after her last session and her certification period has now expired. Patient is now discharged from current episode of PT for this reason.    Personal Factors and Comorbidities Age;Fitness;Past/Current Experience;Comorbidity 2;Behavior Pattern    Comorbidities history of low back pain (episode as a youth and last year) and neck pain, R hip pain with two prior episodes in the past two years.    Examination-Activity Limitations Sleep;Sit;Squat;Stairs;Reach Overhead;Carry;Dressing    Examination-Participation Restrictions Interpersonal Relationship;Driving;Community Activity;Occupation;Meal Prep;Yard Work;Cleaning   using left UE, unable to complete usual workouts, constant disruption of concentration, difficulty looking left, looking up, moving neck, sleeping, etc without difficulty.   Stability/Clinical Decision Making Evolving/Moderate complexity    Rehab Potential Good    PT Frequency Other (comment)    PT Duration 12 weeks    PT Treatment/Interventions ADLs/Self Care Home Management;Cryotherapy;Electrical Stimulation;Moist Heat;Therapeutic activities;Therapeutic exercise;Balance training;Neuromuscular re-education;Patient/family education;Manual techniques;Dry needling;Passive range of motion;Spinal Manipulations;Joint Manipulations;Aquatic Therapy;Biofeedback;Traction;Taping    PT Next Visit Plan patient is now discharged form PT    PT Home Exercise Plan Medbridge Access Code: [email or web address]    Consulted and Agree with Plan of Care Patient           Patient will benefit from skilled therapeutic intervention in order to improve the following deficits and impairments:  Increased muscle spasms,Pain,Decreased activity tolerance,Decreased endurance,Decreased  mobility,Difficulty walking,Impaired perceived functional ability,Postural dysfunction,Hypermobility,Decreased coordination,Decreased balance,Impaired sensation,Decreased range of motion,Decreased strength,Impaired flexibility  Visit Diagnosis: Pain in right hip  Muscle weakness (generalized)  Cervicalgia  Pain in thoracic spine  Other symptoms and signs involving the musculoskeletal system  Paresthesia of skin     Problem List Patient Active Problem List   Diagnosis Date Noted  . 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 Acute left-sided thoracic back pain 03/22/2021  . Vitamin D deficiency 12/26/2020  . Arthralgia of left acromioclavicular joint 12/11/2020  . Chronic neck pain 12/11/2020  . Overweight (BMI 25.0-29.9) 11/24/2020  . Dizziness 11/24/2020  . Abdominal pain 05/30/2020  . Acute hip pain 07/19/2019  . Rash/skin eruption 07/16/2019  . Preventative health care 04/16/2018    Everlean Alstrom. Graylon Good, PT, DPT 04/11/21, 10:30 AM  [facility] [address] [place], [address] Phone: [phone]   Fax:  [phone]  Name: Lori Mcgee MRN: [id number] Date of Birth: June 16, 1986

## 2021-04-16 ENCOUNTER — Telehealth: Payer: Self-pay | Admitting: Primary Care

## 2021-04-16 DIAGNOSIS — R1012 Left upper quadrant pain: Secondary | ICD-10-CM

## 2021-04-16 NOTE — Telephone Encounter (Signed)
Patient is very concerned that she has not heard from our office regarding her Ct scan results. I advised that we have not had anything loaded into epic yet on our side. If they faxed over a hard copy of the results, Lori Mcgee is not here to be able to review those. Patient stated that she viewed the results on [facility] and the CT was abnormal.  I was able to pull the results from [facility]. Please see following message for results. Thanks.

## 2021-04-16 NOTE — Telephone Encounter (Signed)
CT Abdomen Pelvis WO IV Contrast  Anatomical Region Laterality Modality  Abdomen -- Computed Tomography  Pelvis -- --  Hip -- --    Impression  IMPRESSION:    1. Nonobstructing stones in the upper pole right kidney.  2. No suggested etiology for left upper quadrant pain. No evidence of hernia    Electronically Signed by: Bernadene Bell on 04/09/2021 3:52 PM  Narrative  TECHNIQUE: CT abdomen and pelvis  0  Isovue-370. Sagittal and coronal reconstructions with maximum intensity projections are evaluated on the workstation and stored in PACS. Radiation dose reduction was utilized, (automated exposure control, mA or kV adjustment based on patient size, or iterative image reconstruction).   Comparison: None.   INDICATION:Left upper quadrant pain   FINDINGS:   CHEST:   #  Lungs: Lung bases are clear.     #  Mediastinum:Normal heart size.   ABDOMEN AND PELVIS:   #  Liver: Normal.    #  Gallbladder:Normal.  #  Spleen:Normal  #  Pancreas:Normal.  #  Adrenals:Normal.  #  Kidneys: Nonobstructing 6 mm stone is depicted in the mid upper pole right kidney. Left kidney is normal. No hydronephrosis on either side..  #  Ureters: Normal where visible  #  Urinary Bladder: Normal.  #  Reproductive organs: No free fluid or pelvic mass.  #  Vessels:Vessels are normal in caliber..  #  Lymph nodes:No retroperitoneal or pelvic adenopathy.  #  IN:OMVEH caliber is normal. No evidence of appendicitis, diverticulitis or bowel obstruction. Normal appendix  #  MSK:  No acute or aggressive bone lesions.. No hernias are present.  Procedure Note  Danella Sensing, MD - 04/09/2021  Formatting of this note might be different from the original.  TECHNIQUE: CT abdomen and pelvis  0  Isovue-370. Sagittal and coronal reconstructions with maximum intensity projections are evaluated on the workstation and stored in PACS. Radiation dose reduction was utilized, (automated exposure control, mA or kV  adjustment based on patient size, or iterative image reconstruction).   Comparison: None.   INDICATION:Left upper quadrant pain   FINDINGS:   CHEST:   #  Lungs: Lung bases are clear.     #  Mediastinum:Normal heart size.   ABDOMEN AND PELVIS:   #  Liver: Normal.    #  Gallbladder:Normal.  #  Spleen:Normal  #  Pancreas:Normal.  #  Adrenals:Normal.  #  Kidneys: Nonobstructing 6 mm stone is depicted in the mid upper pole right kidney. Left kidney is normal. No hydronephrosis on either side..  #  Ureters: Normal where visible  #  Urinary Bladder: Normal.  #  Reproductive organs: No free fluid or pelvic mass.  #  Vessels:Vessels are normal in caliber..   #  Lymph nodes:No retroperitoneal or pelvic adenopathy.  #  MC:NOBSJ caliber is normal. No evidence of appendicitis, diverticulitis or bowel obstruction. Normal appendix  #  MSK:  No acute or aggressive bone lesions.. No hernias are present.    IMPRESSION:    1. Nonobstructing stones in the upper pole right kidney.  2. No suggested etiology for left upper quadrant pain. No evidence of hernia

## 2021-04-17 NOTE — Telephone Encounter (Signed)
Left message to return call to our office.  

## 2021-04-17 NOTE — Telephone Encounter (Signed)
I did not order this, looks like Dr. Selena Batten did for an acute visit in May 2022. Not sure if Dr. Selena Batten received notification of this and/or if she's notified patient.   You can tell patient it was negative for hernia and did not show cause for her pain.

## 2021-04-17 NOTE — Telephone Encounter (Signed)
Did you have any information you wanted [place] to give patient

## 2021-04-17 NOTE — Telephone Encounter (Signed)
Noted  

## 2021-04-18 NOTE — Telephone Encounter (Signed)
Patient returning a call about CT results.

## 2021-04-19 NOTE — Telephone Encounter (Signed)
Left message to return call to our office.  

## 2021-04-20 NOTE — Telephone Encounter (Signed)
Spoke with patient via phone regarding her CT scan results, explained that the kidney stones in her kidney which would not cause pain.  She continues to experience left upper quadrant abdominal pain, worse with eating.  Will order H. pylori testing.  She will pick up the kit Monday next week.

## 2021-04-20 NOTE — Addendum Note (Signed)
Addended by: Doreene Nest on: 04/20/2021 05:45 PM   Modules accepted: Orders

## 2021-04-20 NOTE — Telephone Encounter (Signed)
Pt called back and I read her what Jae Dire has wrote. She wants Dr Selena Batten to result is and explain why it says she has a 6 mm kidney stone but saying there is nothing to explain the pain.

## 2021-09-03 ENCOUNTER — Ambulatory Visit: Payer: 59 | Admitting: Nurse Practitioner

## 2021-12-12 IMAGING — DX DG CHEST 2V
2 series · 2 of 2 positions shown · non-contrast
Comparison: 07/12/2018

CLINICAL DATA: Acute left chest pain

EXAM:
CHEST - 2 VIEW

[chest pa]
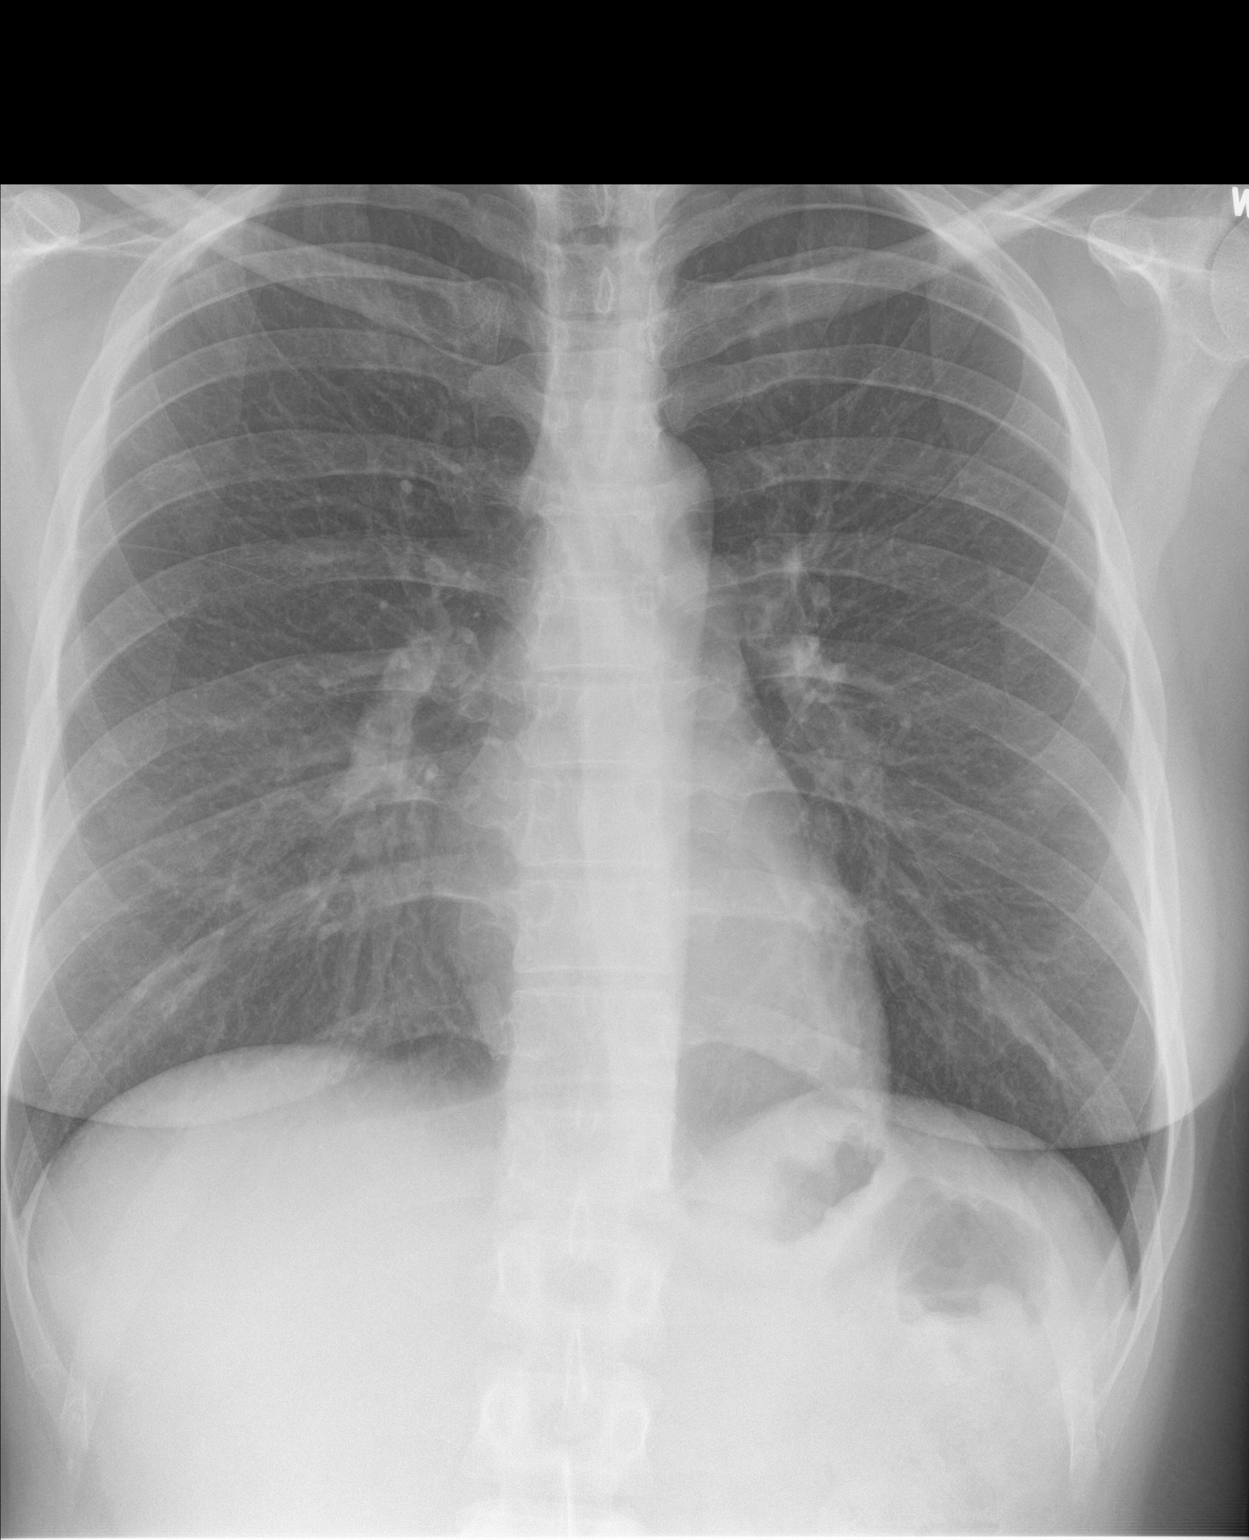

[chest lat]
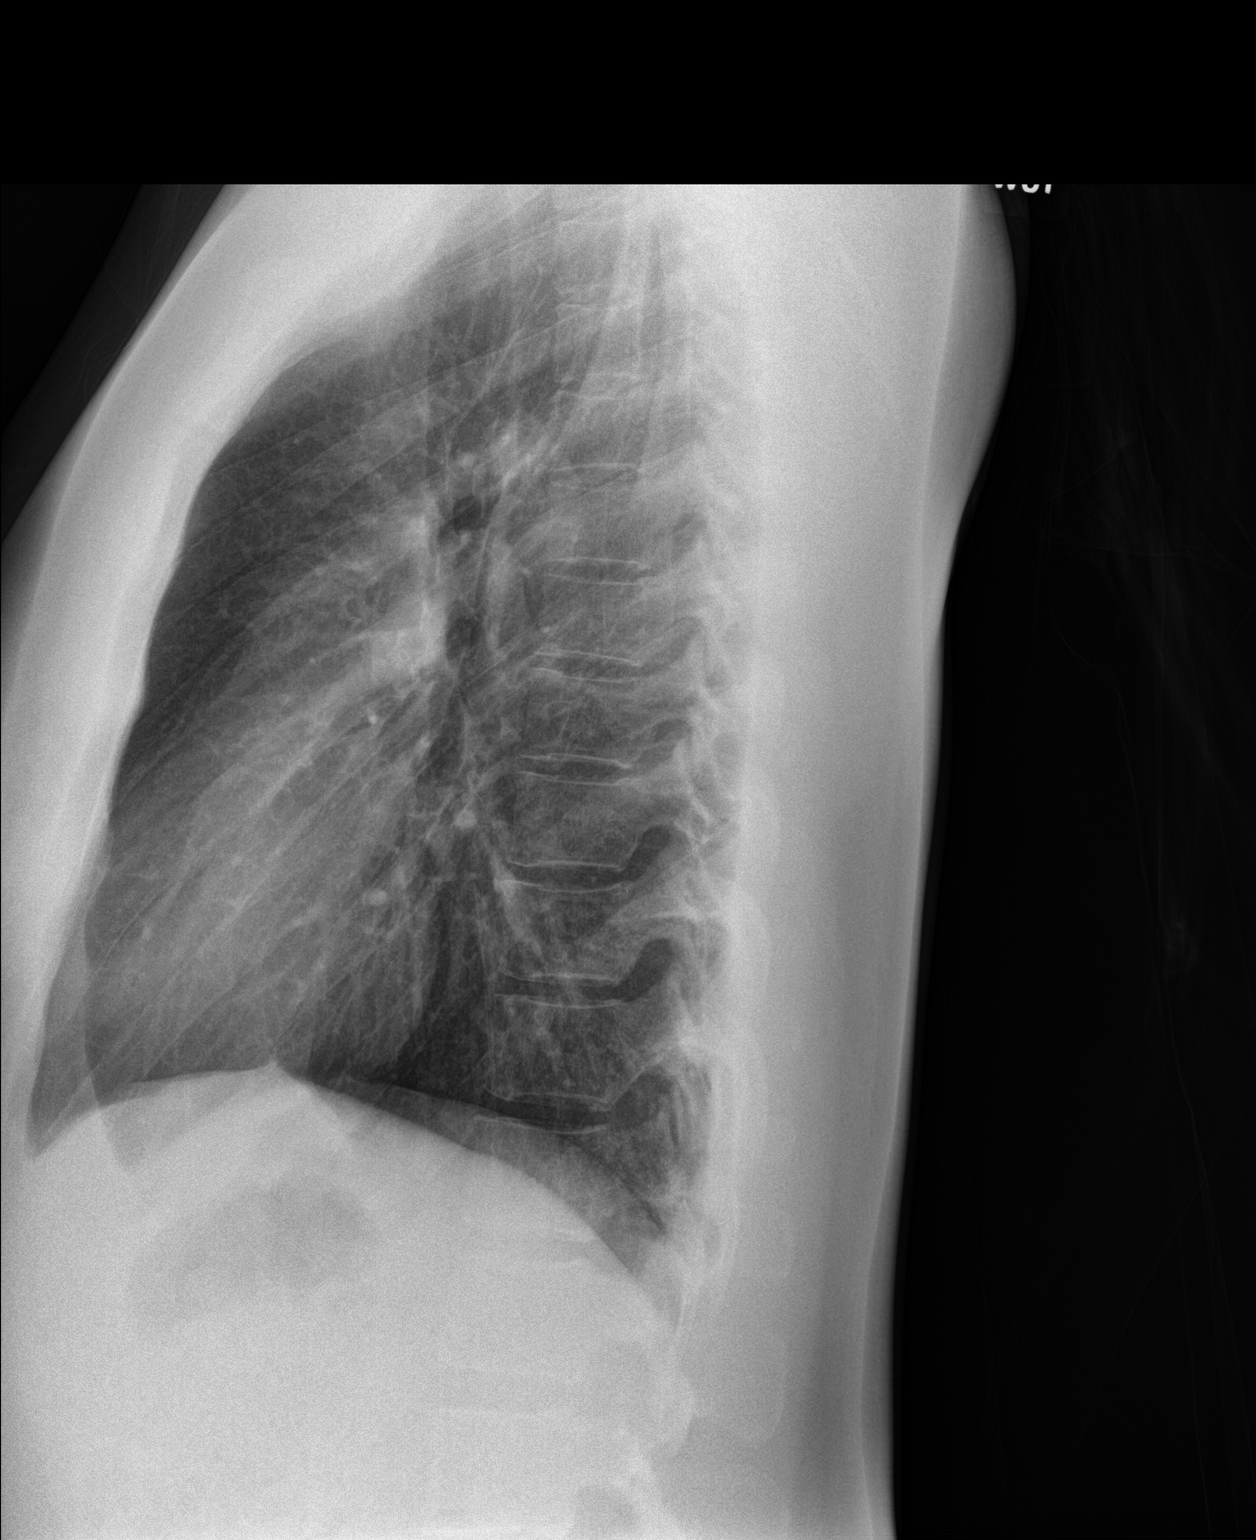

[2 of 2 positions shown; findings below may reference images not displayed]

FINDINGS: The heart size and mediastinal contours are within normal limits.
Both lungs are clear. The visualized skeletal structures are
unremarkable.
IMPRESSION: No active cardiopulmonary disease.

## 2022-02-08 IMAGING — MG DIGITAL DIAGNOSTIC BILAT W/ TOMO W/ CAD
6 of 10 series · 6 of 30 positions shown · non-contrast
Comparison: Previous exam(s).

CLINICAL DATA: Pea-sized mass felt by the patient in the 6 o'clock
position of the left breast for the past 2 months, with associated
pain. Family history of breast cancer in a paternal aunt.

EXAM:
DIGITAL DIAGNOSTIC BILATERAL MAMMOGRAM WITH TOMOSYNTHESIS AND CAD;
ULTRASOUND LEFT BREAST LIMITED
TECHNIQUE: Bilateral digital diagnostic mammography and breast tomosynthesis
was performed. The images were evaluated with computer-aided
detection.; Targeted ultrasound examination of the left breast was
performed

[L MLO synth-2D]
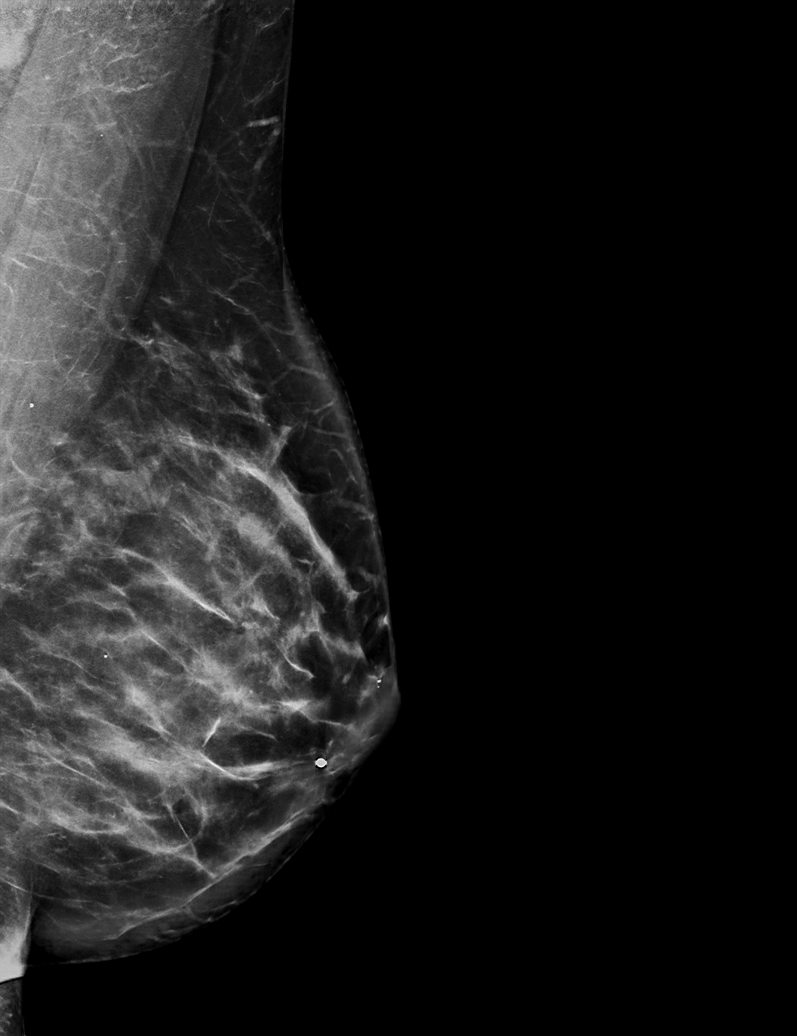

[R MLO synth-2D]
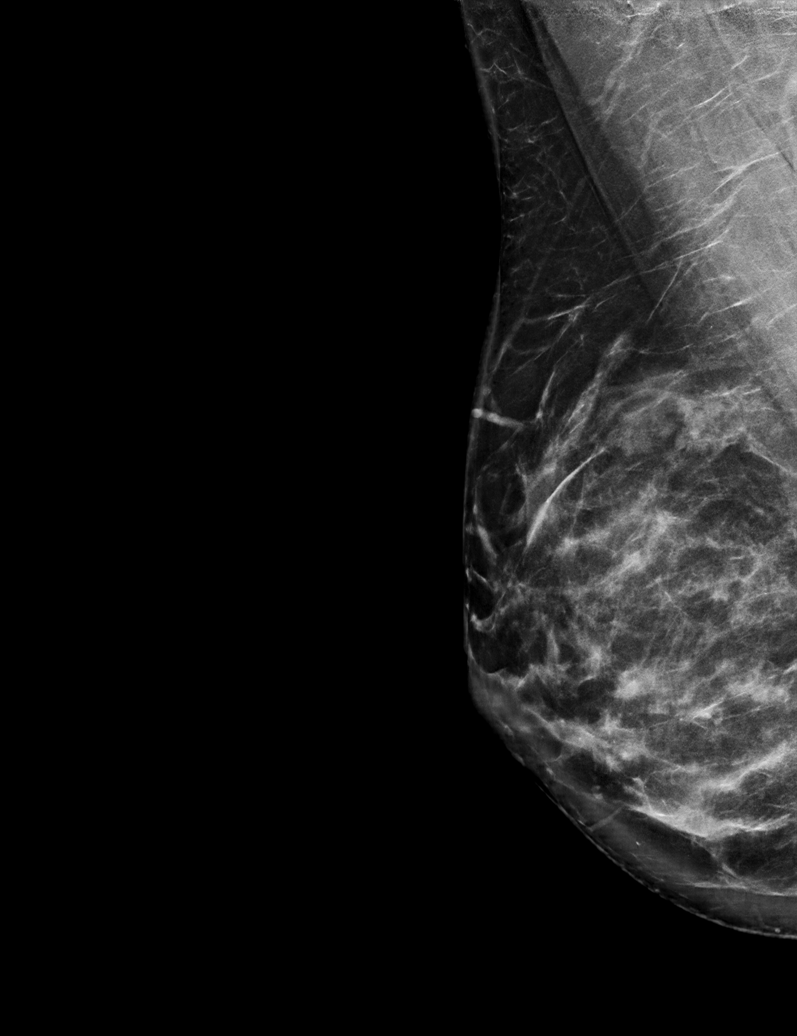

[L TAN synth-2D]
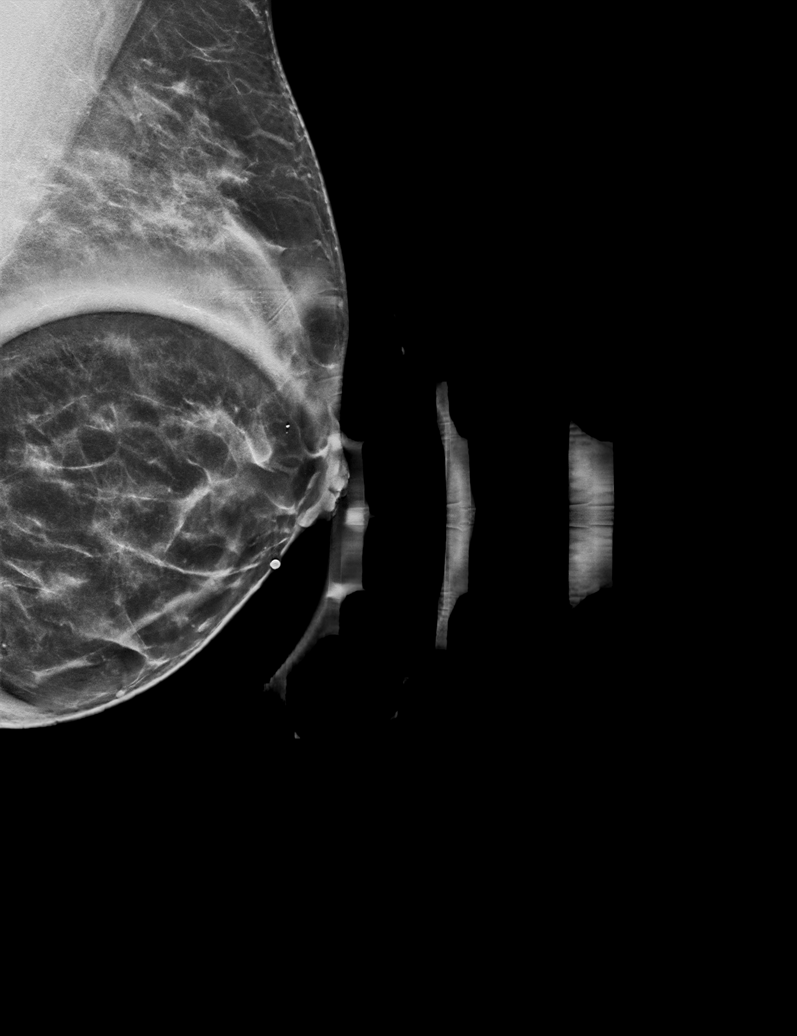

[R CC synth-2D]
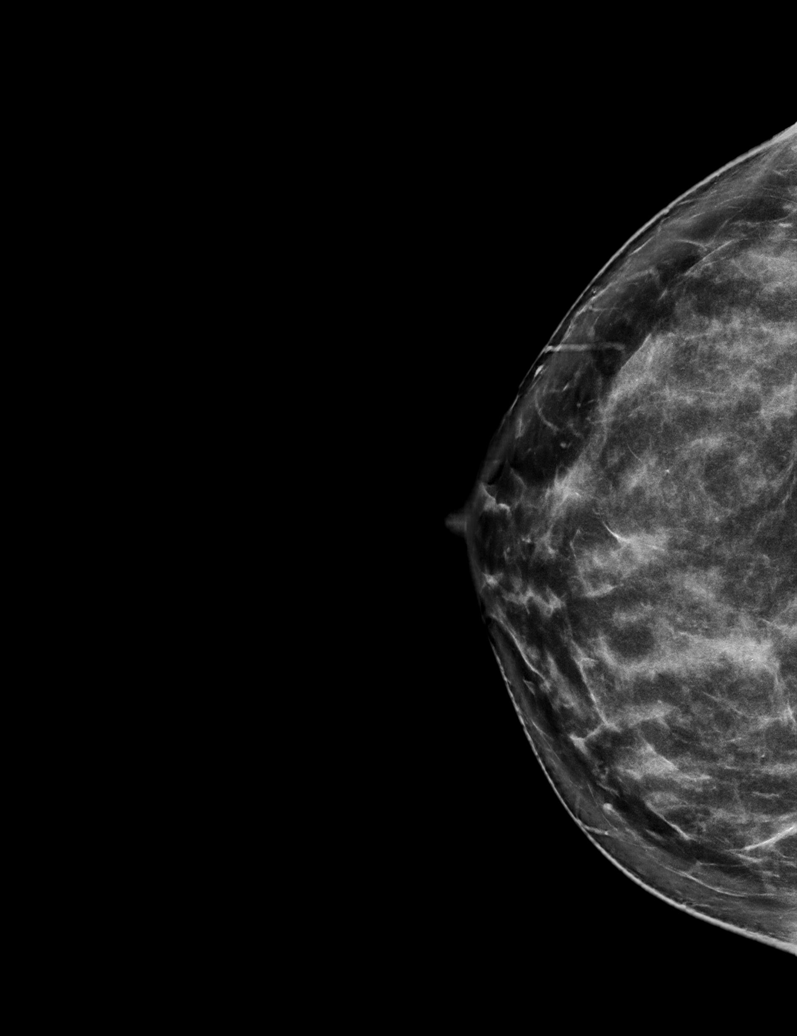

[L CC synth-2D]
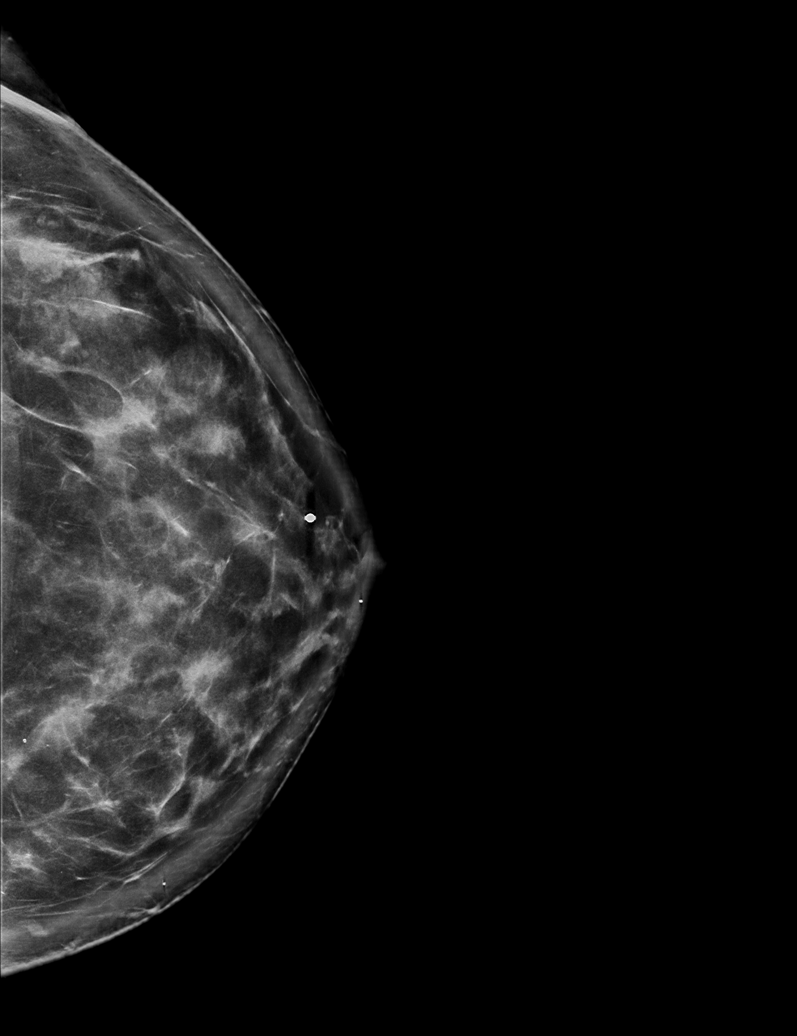

[R MLO tomo · tomo slice 47/92.0]
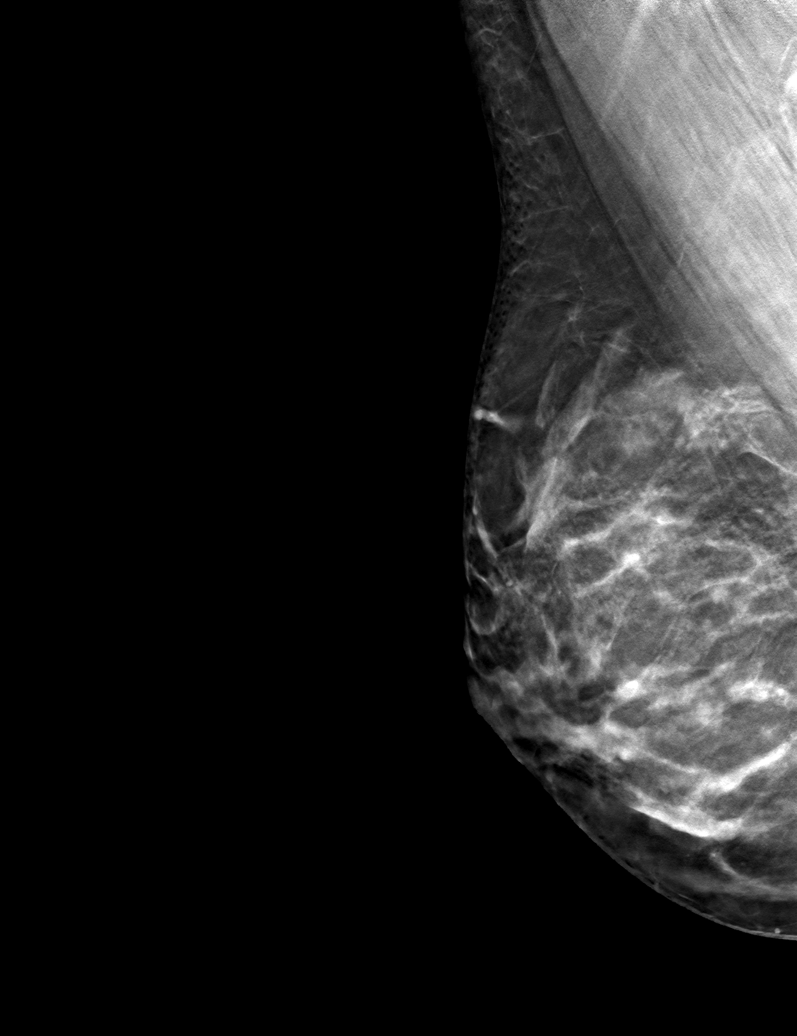

[6 of 30 positions shown; findings below may reference images not displayed]

ACR Breast Density Category c: The breast tissue is heterogeneously
dense, which may obscure small masses.
FINDINGS: An oval, circumscribed mass in the upper outer left breast is
unchanged compared to previous examinations. There is an interval
group of multiple small, oval, circumscribed masses in the posterior
outer left breast. There is no mammographically visible mass in the
6 o'clock position of the left breast at the location the mass felt
by the patient with associated pain. No findings on the right
suspicious for malignancy.

On physical exam, the patient is tender to palpation in the 6
o'clock position of the left breast, 3 cm from the nipple without a
discrete palpable mass today. There are multiple small, oval
palpable masses in 3 o'clock position of the left breast, 5 cm from
the nipple.

Targeted ultrasound is performed, showing a 1.4 cm cyst containing a
mildly thickened internal septation and low-level dependent internal
echoes in the 6 o'clock position of the left breast, 3 cm from the
nipple. No internal blood flow was seen with power Doppler.

There are multiple subcentimeter cysts within an area of dense
fibroglandular/fibrocystic tissue in the 3 o'clock position of the
left breast, 5 cm from the nipple. No solid masses were seen.
IMPRESSION: 1. Benign left breast cysts.
2. No evidence of malignancy in either breast.

RECOMMENDATION:
Annual screening mammography beginning at age 40.

I have discussed the findings and recommendations with the patient.
If applicable, a reminder letter will be sent to the patient
regarding the next appointment.

BI-RADS CATEGORY  2: Benign.

## 2022-05-27 ENCOUNTER — Ambulatory Visit: Payer: 59 | Admitting: Nurse Practitioner

## 2022-05-27 VITALS — BP 120/76 | HR 66 | Temp 97.3°F | Resp 12 | Ht 69.5 in | Wt 195.2 lb

## 2022-05-27 DIAGNOSIS — H6121 Impacted cerumen, right ear: Secondary | ICD-10-CM

## 2022-05-27 DIAGNOSIS — R21 Rash and other nonspecific skin eruption: Secondary | ICD-10-CM

## 2022-05-27 HISTORY — DX: Impacted cerumen, right ear: H61.21

## 2022-05-27 MED ORDER — METHYLPREDNISOLONE ACETATE 80 MG/ML IJ SUSP
80.0000 mg | Freq: Once | INTRAMUSCULAR | Status: AC
  Administered 2022-05-27: 80 mg via INTRAMUSCULAR

## 2022-05-27 NOTE — Patient Instructions (Signed)
Nice to see you today The steroids given in office should take care of the rash Follow up if no improvement or send me a mychart message

## 2022-05-27 NOTE — Progress Notes (Signed)
Acute Office Visit  Subjective:     Patient ID: Lori Mcgee, female    DOB: 05/07/1986, 36 y.o.   MRN: [id number]  Chief Complaint  Patient presents with   Rash    On legs, buttocks, arm, chest and around the eye. Noticed this about 2 days ago. Thinks maybe poison oak.   Ear Fullness    And itchiness present. X 3 days.     Patient is in today for Rash  States that she thought they were bug bites. States that she was in [place] for the past 5 days and returned last night. She noticed it 2 days into the trip. States that she has been using hydro cortisone cream over-the-counter with mild itching relief  Ear fullness: states that she went snorkling and felt a pop. States that ears are itching and decreased hearing. No FB in the ear and no bleeding. Has tried swimmer ear drops with out relief.   Review of Systems  Constitutional:  Negative for chills and fever.  HENT:  Positive for hearing loss. Negative for ear discharge and ear pain.   Respiratory:  Negative for shortness of breath.   Cardiovascular:  Negative for chest pain.  Skin:  Positive for rash.        Objective:    BP 120/76   Pulse 66   Temp (!) 97.3 F (36.3 C)   Resp 12   Ht 5' 9.5" (1.765 m)   Wt 195 lb 4 oz (88.6 kg)   LMP 05/06/2022   SpO2 98%   BMI 28.42 kg/m    Physical Exam Vitals and nursing note reviewed.  Constitutional:      Appearance: Normal appearance.  HENT:     Right Ear: Ear canal and external ear normal. There is impacted cerumen.     Left Ear: Tympanic membrane, ear canal and external ear normal. There is no impacted cerumen.     Mouth/Throat:     Mouth: Mucous membranes are moist.     Pharynx: Oropharynx is clear.  Cardiovascular:     Rate and Rhythm: Normal rate and regular rhythm.     Heart sounds: Normal heart sounds.  Pulmonary:     Effort: Pulmonary effort is normal.     Breath sounds: Normal breath sounds.  Skin:    Findings: Rash present.     Comments:  Scattered rash to left anterior neck. Left anterior forearm. Macular papular rash.  Vesicular to left arm.  Neurological:     Mental Status: She is alert.     No results found for any visits on 05/27/22.      Assessment & Plan:   Problem List Items Addressed This Visit       Nervous and Auditory   Impacted cerumen of right ear - Primary    Verbal consent obtained.  Patient was prepped and a mixture of water and hydroperoxide was mixed per office policy.  Patient's right ear was irrigated.  Patient tolerated procedure well.  Impaction was dislodged.      Relevant Orders   Ear Lavage     Musculoskeletal and Integument   Rash    Nonspecific.  States she is dealt with poison oak in the past and generally requires steroids.  Recently returned from a trip from [place] and did go on a hike but unsure what she came into contact with.  We will administer Depo-Medrol 80 mg IM x1 dose in office.  Patient reach out via  MyChart or follow back up if rash returns or does not resolve with medication      Relevant Medications   methylPREDNISolone acetate (DEPO-MEDROL) injection 80 mg    Meds ordered this encounter  Medications   methylPREDNISolone acetate (DEPO-MEDROL) injection 80 mg    Return if symptoms worsen or fail to improve.  Audria Nine, NP

## 2022-05-27 NOTE — Assessment & Plan Note (Signed)
Verbal consent obtained.  Patient was prepped and a mixture of water and hydroperoxide was mixed per office policy.  Patient's right ear was irrigated.  Patient tolerated procedure well.  Impaction was dislodged.

## 2022-05-27 NOTE — Assessment & Plan Note (Signed)
Nonspecific.  States she is dealt with poison oak in the past and generally requires steroids.  Recently returned from a trip from [place] and did go on a hike but unsure what she came into contact with.  We will administer Depo-Medrol 80 mg IM x1 dose in office.  Patient reach out via MyChart or follow back up if rash returns or does not resolve with medication

## 2022-05-29 ENCOUNTER — Encounter: Payer: Self-pay | Admitting: Nurse Practitioner

## 2022-05-29 DIAGNOSIS — R21 Rash and other nonspecific skin eruption: Secondary | ICD-10-CM

## 2022-05-30 MED ORDER — FAMOTIDINE 20 MG PO TABS: 20.0000 mg | ORAL_TABLET | Freq: Two times a day (BID) | ORAL | 0 refills | Status: DC

## 2022-05-30 MED ORDER — LEVOCETIRIZINE DIHYDROCHLORIDE 5 MG PO TABS: 5.0000 mg | ORAL_TABLET | Freq: Every evening | ORAL | 0 refills | Status: DC

## 2022-06-25 ENCOUNTER — Telehealth: Payer: Self-pay | Admitting: Primary Care

## 2022-06-25 ENCOUNTER — Encounter: Payer: Self-pay | Admitting: Primary Care

## 2022-06-25 ENCOUNTER — Ambulatory Visit (INDEPENDENT_AMBULATORY_CARE_PROVIDER_SITE_OTHER): Payer: 59 | Admitting: Primary Care

## 2022-06-25 VITALS — Temp 98.6°F | Ht 69.5 in | Wt 192.0 lb

## 2022-06-25 DIAGNOSIS — Z114 Encounter for screening for human immunodeficiency virus [HIV]: Secondary | ICD-10-CM | POA: Diagnosis not present

## 2022-06-25 DIAGNOSIS — R002 Palpitations: Secondary | ICD-10-CM

## 2022-06-25 DIAGNOSIS — Z1159 Encounter for screening for other viral diseases: Secondary | ICD-10-CM

## 2022-06-25 DIAGNOSIS — R42 Dizziness and giddiness: Secondary | ICD-10-CM | POA: Diagnosis not present

## 2022-06-25 LAB — POC URINALSYSI DIPSTICK (AUTOMATED)
Bilirubin, UA: NEGATIVE
Glucose, UA: NEGATIVE
Ketones, UA: NEGATIVE
Leukocytes, UA: NEGATIVE
Nitrite, UA: NEGATIVE
Protein, UA: NEGATIVE
Spec Grav, UA: 1.01 (ref 1.010–1.025)
Urobilinogen, UA: 0.2 E.U./dL
pH, UA: 6 (ref 5.0–8.0)

## 2022-06-25 LAB — POCT URINE PREGNANCY: Preg Test, Ur: NEGATIVE

## 2022-06-25 NOTE — Telephone Encounter (Signed)
I spoke with Lori Mcgee; Lori Mcgee said she initially was planning on going out of town today but Lori Mcgee spoke with her husband and thinks she should be seen sooner. Now Lori Mcgee having shakiness in hands, dizziness where Lori Mcgee feels like she is spinning (happening pretty consistently);feels like heart is racing; no way to ck BP; Lori Mcgee said she feels uneasy. Never felt this way before.Lori Mcgee does not feel in distress at this time; no CP, no SOB and no H/A. Lori Mcgee is not taking any new meds and Lori Mcgee had usual cup of coffee this morning. Mayra Reel NP said that she can see Lori Mcgee today at 3:20 and UC & ED precautions given and Lori Mcgee voiced understanding. Sending note to Allayne Gitelman NP and [facility] CMA.

## 2022-06-25 NOTE — Patient Instructions (Addendum)
Stop by the lab prior to leaving today. I will notify you of your results once received.   We will be in touch Thursday regarding your urine test results.   Consider Try using Flonase (fluticasone) nasal spray. Instill 1 spray in each nostril twice daily.   Consider Meclizine 12.5 mg every 8 hours as needed. This may cause drowsiness.   It was a pleasure to see you today!

## 2022-06-25 NOTE — Telephone Encounter (Signed)
[facility] Day - Client TELEPHONE ADVICE RECORD AccessNurse Patient Name: Lori Mcgee [place] OIN[place] Gender: Female DOB: 1986/04/10 Age: 36 Y 7 M 30 D Return Phone Number: [phone] (Primary), [phone] (Secondary) Address: City/ State/ Zip: [place]  [address] Client [name] Day - Client Client Site [name] - Day Provider Vernona Rieger - NP Contact Type Call Who Is Calling Patient / Member / Family / Caregiver Call Type Triage / Clinical Relationship To Patient Self Return Phone Number [phone] (Secondary) Chief Complaint Dizziness Reason for Call Symptomatic / Request for Health Information Initial Comment [name] with [facility] transferring caller - appt scheduled for 08/28 caller having dizziness and her some pressure on her head Translation No Nurse Assessment Nurse: Freida Busman, RN, Jonette Eva Date/Time (Eastern Time): 06/25/2022 10:03:59 AM Confirm and document reason for call. If symptomatic, describe symptoms. ---Caller states dizziness for about a week like she is moving no nausea no h/a no vision changes it is usually worse when she wakes up and at the end of the day it goes away while she is working out hard to concentrate pressure in her head after the spinning stops no earache went to the doc x 2 weeks ago with ear pain after snorkeling Does the patient have any new or worsening symptoms? ---Yes Will a triage be completed? ---Yes Related visit to physician within the last 2 weeks? ---No Does the PT have any chronic conditions? (i.e. diabetes, asthma, this includes High risk factors for pregnancy, etc.) ---No Is the patient pregnant or possibly pregnant? (Ask all females between the ages of 26-55) ---No Is this a behavioral health or substance abuse call? ---No Guidelines Guideline Title Affirmed Question Affirmed Notes Nurse Date/Time (Eastern Time) Dizziness - Vertigo [1]  MODERATE dizziness (e.g., vertigo; feels very unsteady, interferes with normal activities) Freida Busman, Charity fundraiser, Jonette Eva 06/25/2022 10:06:59 AM PLEASE NOTE: All timestamps contained within this report are represented as [place] Standard Time. CONFIDENTIALTY NOTICE: This fax transmission is intended only for the addressee. It contains information that is legally privileged, confidential or otherwise protected from use or disclosure. If you are not the intended recipient, you are strictly prohibited from reviewing, disclosing, copying using or disseminating any of this information or taking any action in reliance on or regarding this information. If you have received this fax in error, please notify us immediately by telephone so that we can arrange for its return to [place]. Phone: [phone], Toll-Free: [phone], Fax: [phone] Page: 2 of 2 Call Id: [id number] Guidelines Guideline Title Affirmed Question Affirmed Notes Nurse Date/Time [name] Time) AND [2] has NOT been evaluated by doctor (or NP/PA) for this Disp. Time [name] Time) Disposition Final User 06/25/2022 10:09:36 AM See PCP within 24 Hours Yes Freida Busman, RN, Jonette Eva Final Disposition 06/25/2022 10:09:36 AM See PCP within 24 Hours Yes Freida Busman, RN, Jonette Eva Caller Disagree/Comply Disagree Caller Understands Yes PreDisposition Call Doctor Care Advice Given Per Guideline SEE PCP WITHIN 24 HOURS: CALL BACK IF: * Severe headache occurs * Weakness develops in an arm or leg * Unable to walk without falling * You become worse CARE ADVICE given per Dizziness - Vertigo (Adult) guideline. Referrals REFERRED TO PCP OFFIC

## 2022-06-25 NOTE — Telephone Encounter (Signed)
Patient called and stated she has been experiencing some dizziness. Patient has been sent to access nurse.

## 2022-06-25 NOTE — Assessment & Plan Note (Signed)
ECG today with NSR, rate of 61, no PAC/PVC. V2 t-wave inversion which is not evident in ECG from 2018.   Checking labs today including TSH, CBC, A1C, CMP. Consider cardiology evaluation. Await results.

## 2022-06-25 NOTE — Assessment & Plan Note (Addendum)
Suspect inner ear to be contributing to some symptoms.   ECG today with NSR, rate of 61, no PAC/PVC. V2 t-wave inversion which is not evident in ECG from 2018.  Orthostatic vitals negative.   Checking labs today including TSH, CBC, A1C, CMP. Urinalysis today with  Urine pregnancy test negative

## 2022-06-25 NOTE — Telephone Encounter (Signed)
Noted, will evaluate. 

## 2022-06-25 NOTE — Progress Notes (Signed)
Subjective:    Patient ID: Lori Mcgee, female    DOB: [birth year], 36 y.o.   MRN: [id number]  Dizziness Associated symptoms include fatigue and headaches. Pertinent negatives include no chest pain or congestion.    Lori Mcgee is a very pleasant 36 y.o. female with a history of cerumen impaction, abdominal pain, chronic neck pain, vitamin D deficiency who presents today to discuss dizziness.   She called our office this morning reporting a one week history of intermittent symptoms of dizziness, head pressure, heart racing sensation, fatigue. Given these symptoms she was advised to be seen today.   She wakes up feeling dizzy, feels dizzy with movement and rest. She begins feeling tired during the evening. When looking at her phone her head feels unstable. Today she began feeling shaking with heart racing sensations, this has been intermittent today, lasting a few seconds. She's felt none of these symptoms when exercising.   She denies changes in medications, new supplements, dehydration, cough/cold symptoms, increased anxiety. Evaluated by her eye doctor a few weeks ago, normal eye exam. LMP was 06/06/22.   Evaluated by Audria Nine, NP one month ago for rash and ear fullness. Ear fullness developed after a snorkeling trip. She was noted to have a right cerumen impaction, irrigation completed, impaction dislodged. She's not sure if her symptoms over the last week are related.     Review of Systems  Constitutional:  Positive for fatigue.  HENT:  Negative for congestion and ear pain.   Respiratory:  Negative for shortness of breath.   Cardiovascular:  Positive for palpitations. Negative for chest pain.  Genitourinary:  Negative for dysuria and frequency.  Neurological:  Positive for dizziness and headaches.  Psychiatric/Behavioral:  The patient is not nervous/anxious.          Past Medical History:  Diagnosis Date   BV (bacterial vaginosis)    Impacted cerumen of right ear  05/27/2022   Tick-borne disease 03/22/2021    Social History   Socioeconomic History   Marital status: Married    Spouse name: Not on file   Number of children: Not on file   Years of education: Not on file   Highest education level: Not on file  Occupational History   Not on file  Tobacco Use   Smoking status: Never   Smokeless tobacco: Never  Vaping Use   Vaping Use: Never used  Substance and Sexual Activity   Alcohol use: No   Drug use: No   Sexual activity: Yes    Birth control/protection: None  Other Topics Concern   Not on file  Social History Narrative   Married.   3 children.   Works in [employer].    Enjoys exercising and playing basketball.     Social Determinants of Health   Financial Resource Strain: Not on file  Food Insecurity: Not on file  Transportation Needs: Not on file  Physical Activity: Not on file  Stress: Not on file  Social Connections: Not on file  Intimate Partner Violence: Not on file    Past Surgical History:  Procedure Laterality Date   HYSTEROSCOPY N/A 10/31/2016   Procedure: HYSTEROSCOPY WITH IUD REMOVAL;  Surgeon: Hildred Laser, MD;  Location: [facility] ORS;  Service: Gynecology;  Laterality: N/A;   iud extraction  10/31/2016   WISDOM TOOTH EXTRACTION      Family History  Problem Relation Age of Onset   Diabetes Father    Hypertension Father    Hypothyroidism  Father    Hypertension Mother    Breast cancer Paternal Aunt        late 72's or early 70's    No Known Allergies  Current Outpatient Medications on File Prior to Visit  Medication Sig Dispense Refill   famotidine (PEPCID) 20 MG tablet Take 1 tablet (20 mg total) by mouth 2 (two) times daily. (Patient not taking: Reported on 06/25/2022) 14 tablet 0   levocetirizine (XYZAL) 5 MG tablet Take 1 tablet (5 mg total) by mouth every evening. (Patient not taking: Reported on 06/25/2022) 30 tablet 0   No current facility-administered medications on file prior to  visit.    Temp 98.6 F (37 C) (Oral)   Ht 5' 9.5" (1.765 m)   Wt 192 lb (87.1 kg)   LMP 05/06/2022   SpO2 99%   BMI 27.95 kg/m  Objective:   Physical Exam Constitutional:      General: She is not in acute distress. HENT:     Right Ear: Ear canal normal. There is no impacted cerumen. Tympanic membrane is bulging. Tympanic membrane is not erythematous.     Left Ear: Tympanic membrane and ear canal normal. There is no impacted cerumen. Tympanic membrane is not erythematous or bulging.     Ears:     Comments: Mild fluid noted to left TM Eyes:     Extraocular Movements: Extraocular movements intact.  Cardiovascular:     Rate and Rhythm: Normal rate and regular rhythm.  Pulmonary:     Effort: Pulmonary effort is normal.     Breath sounds: Normal breath sounds.  Musculoskeletal:     Cervical back: Neck supple.  Skin:    General: Skin is warm and dry.  Neurological:     Mental Status: She is oriented to person, place, and time.     Cranial Nerves: No cranial nerve deficit.     Coordination: Coordination normal.  Psychiatric:        Mood and Affect: Mood normal.           Assessment & Plan:   Problem List Items Addressed This Visit       Other   Dizziness - Primary    Suspect inner ear to be contributing to some symptoms.   ECG today with NSR, rate of 61, no PAC/PVC. V2 t-wave inversion which is not evident in ECG from 2018.  Orthostatic vitals negative.   Checking labs today including TSH, CBC, A1C, CMP. Urinalysis today with  Urine pregnancy test negative       Relevant Orders   POCT Urinalysis Dipstick (Automated) (Completed)   POCT urine pregnancy   CBC with Differential/Platelet   Comprehensive metabolic panel   Hemoglobin A1c   TSH   Urine Culture   Urine Microscopic   Palpitations    ECG today with NSR, rate of 61, no PAC/PVC. V2 t-wave inversion which is not evident in ECG from 2018.   Checking labs today including TSH, CBC, A1C,  CMP. Consider cardiology evaluation. Await results.       Relevant Orders   EKG 12-Lead   POCT Urinalysis Dipstick (Automated) (Completed)   POCT urine pregnancy   CBC with Differential/Platelet   Comprehensive metabolic panel   Hemoglobin A1c   TSH   Other Visit Diagnoses     Screening for HIV (human immunodeficiency virus)       Relevant Orders   HIV antibody (with reflex)   Encounter for hepatitis C screening test for low risk patient  Relevant Orders   Hepatitis C Antibody          Pleas Koch, NP

## 2022-06-26 LAB — CBC WITH DIFFERENTIAL/PLATELET
Basophils Absolute: 0.1 10*3/uL (ref 0.0–0.1)
Basophils Relative: 1 % (ref 0.0–3.0)
Eosinophils Absolute: 0.1 10*3/uL (ref 0.0–0.7)
Eosinophils Relative: 0.9 % (ref 0.0–5.0)
HCT: 38.7 % (ref 36.0–46.0)
Hemoglobin: 13.2 g/dL (ref 12.0–15.0)
Lymphocytes Relative: 34.6 % (ref 12.0–46.0)
Lymphs Abs: 2.6 10*3/uL (ref 0.7–4.0)
MCHC: 34.2 g/dL (ref 30.0–36.0)
MCV: 92.6 fl (ref 78.0–100.0)
Monocytes Absolute: 0.6 10*3/uL (ref 0.1–1.0)
Monocytes Relative: 8.4 % (ref 3.0–12.0)
Neutro Abs: 4.2 10*3/uL (ref 1.4–7.7)
Neutrophils Relative %: 55.1 % (ref 43.0–77.0)
Platelets: 321 10*3/uL (ref 150.0–400.0)
RBC: 4.18 Mil/uL (ref 3.87–5.11)
RDW: 13.2 % (ref 11.5–15.5)
WBC: 7.6 10*3/uL (ref 4.0–10.5)

## 2022-06-26 LAB — HIV ANTIBODY (ROUTINE TESTING W REFLEX): HIV 1&2 Ab, 4th Generation: NONREACTIVE

## 2022-06-26 LAB — COMPREHENSIVE METABOLIC PANEL
ALT: 14 U/L (ref 0–35)
AST: 17 U/L (ref 0–37)
Albumin: 4.4 g/dL (ref 3.5–5.2)
Alkaline Phosphatase: 80 U/L (ref 39–117)
BUN: 12 mg/dL (ref 6–23)
CO2: 28 mEq/L (ref 19–32)
Calcium: 9.3 mg/dL (ref 8.4–10.5)
Chloride: 101 mEq/L (ref 96–112)
Creatinine, Ser: 0.99 mg/dL (ref 0.40–1.20)
GFR: 73.28 mL/min (ref >60.00)
Glucose, Bld: 91 mg/dL (ref 70–99)
Potassium: 3.7 mEq/L (ref 3.5–5.1)
Sodium: 138 mEq/L (ref 135–145)
Total Bilirubin: 0.5 mg/dL (ref 0.2–1.2)
Total Protein: 7.4 g/dL (ref 6.0–8.3)

## 2022-06-26 LAB — URINALYSIS, MICROSCOPIC ONLY

## 2022-06-26 LAB — URINE CULTURE
MICRO NUMBER:: 13814223
Result:: NO GROWTH
SPECIMEN QUALITY:: ADEQUATE

## 2022-06-26 LAB — TSH: TSH: 0.98 u[IU]/mL (ref 0.35–5.50)

## 2022-06-26 LAB — HEPATITIS C ANTIBODY: Hepatitis C Ab: NONREACTIVE

## 2022-06-26 LAB — HEMOGLOBIN A1C: Hgb A1c MFr Bld: 5.4 % (ref 4.6–6.5)

## 2022-07-01 ENCOUNTER — Ambulatory Visit: Payer: 59 | Admitting: Primary Care

## 2022-08-12 ENCOUNTER — Encounter: Payer: Self-pay | Admitting: Nurse Practitioner

## 2022-08-12 ENCOUNTER — Ambulatory Visit (INDEPENDENT_AMBULATORY_CARE_PROVIDER_SITE_OTHER): Payer: 59 | Admitting: Nurse Practitioner

## 2022-08-12 VITALS — BP 104/66 | HR 105 | Temp 97.2°F | Resp 14 | Ht 68.5 in | Wt 192.5 lb

## 2022-08-12 DIAGNOSIS — Z021 Encounter for pre-employment examination: Secondary | ICD-10-CM

## 2022-08-12 DIAGNOSIS — Z111 Encounter for screening for respiratory tuberculosis: Secondary | ICD-10-CM | POA: Diagnosis not present

## 2022-08-12 DIAGNOSIS — Z1231 Encounter for screening mammogram for malignant neoplasm of breast: Secondary | ICD-10-CM

## 2022-08-12 DIAGNOSIS — Z23 Encounter for immunization: Secondary | ICD-10-CM

## 2022-08-12 NOTE — Progress Notes (Unsigned)
Established Patient Office Visit  Subjective   Patient ID: Lori Mcgee, female    DOB: 1986/10/19  Age: 36 y.o. MRN: [id number]  Chief Complaint  Patient presents with   Annual Exam    Form for employment to be filled out    HPI  for complete physical and follow up of chronic conditions.  Immunizations: -Tetanus: TD today  -Influenza: defer until later date -Shingles: Too young -Pneumonia: Too young  -HPV: Refused   Diet: Fair diet. 3 meals a day with no snacking. Coffee in the am and water  Exercise: Swim team, play basket ball several times a week. Weight 4-5 times a week   Eye exam:  [facility] 2023 Dental exam: Completes semi-annually   Pap Smear: Com[facility]. Last pap smear. Dr Verdene Rio  Mammogram: Completed in 02/2021 needs updating   Colonoscopy: Too young, currently average risk Lung Cancer Screening: N/A Dexa: N/A  Sleep; goes to bed around 11-12 and get up around 7-8 swim day is 5am. Feels rested no snoring       Review of Systems  Constitutional:  Negative for chills and fever.  Respiratory:  Negative for shortness of breath.   Cardiovascular:  Negative for chest pain and leg swelling.  Gastrointestinal:  Negative for abdominal pain, blood in stool, constipation, diarrhea and melena.       Bm daily   Genitourinary:  Negative for dysuria and hematuria.  Neurological:  Positive for dizziness. Negative for headaches.  Psychiatric/Behavioral:  Negative for hallucinations and suicidal ideas.       Objective:     BP 104/66   Pulse (!) 105   Temp (!) 97.2 F (36.2 C)   Resp 14   Ht 5' 8.5" (1.74 m)   Wt 192 lb 8 oz (87.3 kg)   SpO2 97%   BMI 28.84 kg/m    Physical Exam Vitals and nursing note reviewed.  Constitutional:      Appearance: Normal appearance.  HENT:     Right Ear: Tympanic membrane, ear canal and external ear normal.     Left Ear: Tympanic membrane, ear canal and external ear normal.     Mouth/Throat:      Mouth: Mucous membranes are moist.     Pharynx: Oropharynx is clear.  Eyes:     Extraocular Movements: Extraocular movements intact.     Pupils: Pupils are equal, round, and reactive to light.  Cardiovascular:     Rate and Rhythm: Normal rate and regular rhythm.     Pulses: Normal pulses.     Heart sounds: Normal heart sounds.  Pulmonary:     Effort: Pulmonary effort is normal.     Breath sounds: Normal breath sounds.  Abdominal:     General: Bowel sounds are normal. There is no distension.     Palpations: There is no mass.     Tenderness: There is no abdominal tenderness.     Hernia: No hernia is present.  Musculoskeletal:     Right lower leg: No edema.     Left lower leg: No edema.  Lymphadenopathy:     Cervical: No cervical adenopathy.  Skin:    General: Skin is warm.  Neurological:     General: No focal deficit present.     Mental Status: She is alert.     Deep Tendon Reflexes:     Reflex Scores:      Bicep reflexes are 2+ on the right side and 2+ on the  left side.      Patellar reflexes are 2+ on the right side and 2+ on the left side.    Comments: Bilateral upper and lower extremity strength 5/5  Psychiatric:        Mood and Affect: Mood normal.        Behavior: Behavior normal.        Thought Content: Thought content normal.        Judgment: Judgment normal.      No results found for any visits on 08/12/22.    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  Form was brought in and filled out until completion.  Pending TB test results prior to finishing form completely.  She will follow-up for TB result and forms will be completed and given to  patient at that point.      Screening-pulmonary TB    PPD placed today follow-up 48 hours for reading.      Relevant Orders   PPD (Completed)   Other Visit Diagnoses     Need for Td vaccine       Relevant Orders   Td : Tetanus/diphtheria >7yo Preservative  free   Encounter for screening mammogram for malignant neoplasm of breast       Relevant Orders   MM Digital Screening       No follow-ups on file.    Audria Nine, NP

## 2022-08-12 NOTE — Assessment & Plan Note (Signed)
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  Form was brought in and filled out until completion.  Pending TB test results prior to finishing form completely.  She will follow-up for TB result and forms will be completed and given to patient at that point.

## 2022-08-12 NOTE — Patient Instructions (Signed)
Nice to see you today Once you have the TB test read and it is negative they will give you your form Follow up with Lori Mcgee as scheduled or needed  We did up date you tetanus today

## 2022-08-12 NOTE — Assessment & Plan Note (Signed)
PPD placed today follow-up 48 hours for reading.

## 2022-08-13 ENCOUNTER — Encounter: Payer: Self-pay | Admitting: Nurse Practitioner

## 2022-08-14 LAB — TB SKIN TEST
Induration: 0 mm
TB Skin Test: NEGATIVE

## 2023-01-16 ENCOUNTER — Encounter: Payer: Self-pay | Admitting: Nurse Practitioner

## 2023-02-18 ENCOUNTER — Telehealth: Payer: Self-pay | Admitting: Primary Care

## 2023-02-18 ENCOUNTER — Ambulatory Visit (INDEPENDENT_AMBULATORY_CARE_PROVIDER_SITE_OTHER): Payer: 59 | Admitting: Family

## 2023-02-18 ENCOUNTER — Encounter: Payer: Self-pay | Admitting: Family

## 2023-02-18 ENCOUNTER — Telehealth: Payer: Self-pay

## 2023-02-18 VITALS — BP 132/76 | HR 62 | Temp 97.6°F | Ht 68.5 in | Wt 186.4 lb

## 2023-02-18 DIAGNOSIS — T781XXA Other adverse food reactions, not elsewhere classified, initial encounter: Secondary | ICD-10-CM

## 2023-02-18 DIAGNOSIS — R1012 Left upper quadrant pain: Secondary | ICD-10-CM | POA: Insufficient documentation

## 2023-02-18 DIAGNOSIS — R109 Unspecified abdominal pain: Secondary | ICD-10-CM | POA: Insufficient documentation

## 2023-02-18 LAB — POC URINALSYSI DIPSTICK (AUTOMATED)
Bilirubin, UA: NEGATIVE
Blood, UA: POSITIVE
Glucose, UA: NEGATIVE
Ketones, UA: NEGATIVE
Leukocytes, UA: NEGATIVE
Nitrite, UA: NEGATIVE
Protein, UA: POSITIVE — AB
Spec Grav, UA: 1.025 (ref 1.010–1.025)
Urobilinogen, UA: 0.2 E.U./dL
pH, UA: 6 (ref 5.0–8.0)

## 2023-02-18 LAB — AMYLASE: Amylase: 41 U/L (ref 27–131)

## 2023-02-18 LAB — COMPREHENSIVE METABOLIC PANEL
ALT: 14 U/L (ref 0–35)
AST: 17 U/L (ref 0–37)
Albumin: 4.5 g/dL (ref 3.5–5.2)
Alkaline Phosphatase: 78 U/L (ref 39–117)
BUN: 14 mg/dL (ref 6–23)
CO2: 28 mEq/L (ref 19–32)
Calcium: 9.7 mg/dL (ref 8.4–10.5)
Chloride: 103 mEq/L (ref 96–112)
Creatinine, Ser: 0.74 mg/dL (ref 0.40–1.20)
GFR: 103.44 mL/min (ref >60.00)
Glucose, Bld: 86 mg/dL (ref 70–99)
Potassium: 4.6 mEq/L (ref 3.5–5.1)
Sodium: 137 mEq/L (ref 135–145)
Total Bilirubin: 0.9 mg/dL (ref 0.2–1.2)
Total Protein: 7.8 g/dL (ref 6.0–8.3)

## 2023-02-18 LAB — CBC
HCT: 42.9 % (ref 36.0–46.0)
Hemoglobin: 14.5 g/dL (ref 12.0–15.0)
MCHC: 33.8 g/dL (ref 30.0–36.0)
MCV: 91.5 fl (ref 78.0–100.0)
Platelets: 387 10*3/uL (ref 150.0–400.0)
RBC: 4.69 Mil/uL (ref 3.87–5.11)
RDW: 13.4 % (ref 11.5–15.5)
WBC: 7.1 10*3/uL (ref 4.0–10.5)

## 2023-02-18 LAB — H. PYLORI ANTIBODY, IGG: H Pylori IgG: NEGATIVE

## 2023-02-18 LAB — POCT URINE PREGNANCY: Preg Test, Ur: NEGATIVE

## 2023-02-18 LAB — LIPASE: Lipase: 12 U/L (ref 11.0–59.0)

## 2023-02-18 NOTE — Progress Notes (Signed)
Established Patient Office Visit  Subjective:   Patient ID: Lori Mcgee, female    DOB: 05/09/1986  Age: 37 y.o. MRN: [id number]  CC:  Chief Complaint  Patient presents with   Abdominal Pain    Left side pain    HPI: Lori Mcgee is a 37 y.o. female presenting on 02/18/2023 for Abdominal Pain (Left side pain)  Abdominal Pain    About two weeks ago started with some abdominal cramping, and bowel movement was delayed for about one day, then had a bowel movement and cramping still continued. Over the last one week, had severe heartburn that lasted for about two days, has since improved, but does state ongoing generalized aching pain on left lateral side.   Last night with full body chills and suspected fever (didn't check temperature) still with the abdominal pain. Had a bowel movement this am which was normal but still with pain on left lateral side. Denies blood in stool.   Last night the pain was left lower and sharp, today more left upper and radiates to mid flank area on left side. Had increased burping over the weekend, now improved. No nausea no throwing up.   July 2021 xray [facility] did show constipation and 3 mm right upper pole calculus  Irregular periods, however on course for her 'normal'. Expecting period in eight days or so.   Does not some sensitivity to meat when eating. Does have h/o being bit by a tick.   ROS: Negative unless specifically indicated above in HPI.   Relevant past medical history reviewed and updated as indicated.   Allergies and medications reviewed and updated.  No current outpatient medications on file.  No Known Allergies  Objective:   BP 132/76 (BP Location: Left Arm)   Pulse 62   Temp 97.6 F (36.4 C) (Temporal)   Ht 5' 8.5" (1.74 m)   Wt 186 lb 6.4 oz (84.6 kg)   SpO2 99%   BMI 27.93 kg/m    Physical Exam Constitutional:      General: She is not in acute distress.    Appearance: Normal appearance. She is normal weight.  She is not ill-appearing, toxic-appearing or diaphoretic.  HENT:     Head: Normocephalic.  Cardiovascular:     Rate and Rhythm: Normal rate and regular rhythm.  Pulmonary:     Effort: Pulmonary effort is normal.     Breath sounds: Normal breath sounds.  Abdominal:     General: Abdomen is flat.     Palpations: There is no mass.     Tenderness: There is abdominal tenderness (luq and mild llq). There is left CVA tenderness. There is no right CVA tenderness or guarding.     Hernia: No hernia is present.  Musculoskeletal:        General: Normal range of motion.  Neurological:     General: No focal deficit present.     Mental Status: She is alert and oriented to person, place, and time. Mental status is at baseline.  Psychiatric:        Mood and Affect: Mood normal.        Behavior: Behavior normal.        Thought Content: Thought content normal.        Judgment: Judgment normal.     Assessment & Plan:  Left flank pain Assessment & Plan: R/o kidney stone although with symptoms less likely  Urine preg today in office Urinalysis pending results  Stat ct abd  pelvis w/wo to r/o kidney stone  Orders: -     Urinalysis w microscopic + reflex cultur -     POCT Urinalysis Dipstick (Automated) -     CT ABDOMEN PELVIS W WO CONTRAST; Future -     POCT urine pregnancy -     H. pylori antibody, IgG -     Lipase -     Amylase -     CBC -     Comprehensive metabolic panel  Left upper quadrant abdominal pain Assessment & Plan: With symptoms ddx cholecystitis, pancreatitis.  R/o h pylori and alpha gal  Stat ct abd pelvis w /wo ordered Labs ordered pending results.  Did advise pt of red flag symptoms to monitor for more urgent assessment  Orders: -     CT ABDOMEN PELVIS W WO CONTRAST; Future -     POCT urine pregnancy -     H. pylori antibody, IgG -     Lipase -     Amylase -     CBC -     Comprehensive metabolic panel  Gastrointestinal food sensitivity -     Alpha-Gal Panel      Follow up plan: Return for f/u with pcp if no improvement if symptoms.  Mort Sawyers, FNP

## 2023-02-18 NOTE — Assessment & Plan Note (Signed)
R/o kidney stone although with symptoms less likely  Urine preg today in office Urinalysis pending results  Stat ct abd pelvis w/wo to r/o kidney stone

## 2023-02-18 NOTE — Telephone Encounter (Signed)
Per appt notes pt already has appt scheduled 02/18/23 at 12:40 with Mort Sawyers FNP. Sending note to Hayden Pedro FNP and [facility].

## 2023-02-18 NOTE — Telephone Encounter (Signed)
Saw pt in office, refer to note for additional concerns

## 2023-02-18 NOTE — Telephone Encounter (Signed)
Pt called in stating she has been having sharp adnominal pain that started 2 weeks ago. Pt states the pain doesn't seem to be going away. Pt states she is also experiencing a fever. Scheduled pt with [facility] today, 4/16 @ 12:40 pm. Transferred pt to access nurse. Call back # [phone]

## 2023-02-18 NOTE — Assessment & Plan Note (Signed)
With symptoms ddx cholecystitis, pancreatitis.  R/o h pylori and alpha gal  Stat ct abd pelvis w /wo ordered Labs ordered pending results.  Did advise pt of red flag symptoms to monitor for more urgent assessment

## 2023-02-18 NOTE — Telephone Encounter (Signed)
Received a message from Referrals. Please see message:  The order has been placed for review and clinicals had to be sumbitted to insurance it may take up to  24-48 hours to hear back. Order needs to be moved to:  [facility]  (Selected) Site ID: [id number] Site Address: [address]  [place], [place] [address]     Patient called the office to see if she can eat something. I advised that she can eat and will be contacted once the approval is completed.

## 2023-02-18 NOTE — Telephone Encounter (Signed)
Noted, will address in office today.

## 2023-02-19 ENCOUNTER — Encounter: Payer: Self-pay | Admitting: Family

## 2023-02-19 LAB — ALPHA-GAL PANEL
Allergen, Mutton, f88: 0.15 kU/L — ABNORMAL HIGH
Allergen, Pork, f26: <0.1 kU/L
CLASS: 0

## 2023-02-19 NOTE — Addendum Note (Signed)
Addended by: Mort Sawyers on: 02/19/2023 07:29 AM   Modules accepted: Orders

## 2023-02-19 NOTE — Telephone Encounter (Signed)
Order has been moved.

## 2023-02-19 NOTE — Telephone Encounter (Signed)
This information was sent to referrals.

## 2023-02-21 LAB — EXTRA URINE SPECIMEN

## 2023-02-21 LAB — URINALYSIS W MICROSCOPIC + REFLEX CULTURE

## 2023-02-21 NOTE — Progress Notes (Signed)
Can we confirm if a urine was sent off ?  I need the urinalysis with reflex culture to r/o UTI

## 2023-02-22 LAB — ALPHA-GAL PANEL
Beef: 0.12 kU/L — ABNORMAL HIGH
GALACTOSE-ALPHA-1,3-GALACTOSE IGE*: <0.1 kU/L (ref <0.10)

## 2023-02-22 LAB — INTERPRETATION:

## 2023-02-24 NOTE — Progress Notes (Signed)
fyi

## 2023-02-25 ENCOUNTER — Telehealth: Payer: Self-pay | Admitting: Primary Care

## 2023-02-25 NOTE — Telephone Encounter (Signed)
noted 

## 2023-02-25 NOTE — Telephone Encounter (Signed)
[facility] called requesting to speak to [facility] or nurse regarding some orders they received that were entered wrong. Told office that Carollee Herter was busy & that I could take a message. Office stated they'd just change the order themselves but might need a co sign later. Call back # [phone]

## 2023-03-04 ENCOUNTER — Ambulatory Visit
Admission: RE | Admit: 2023-03-04 | Discharge: 2023-03-04 | Disposition: A | Discharge status: Home / Self Care | Payer: 59 | Source: Ambulatory Visit | Attending: Family | Admitting: Family

## 2023-03-04 DIAGNOSIS — R1012 Left upper quadrant pain: Secondary | ICD-10-CM

## 2023-03-04 DIAGNOSIS — R109 Unspecified abdominal pain: Secondary | ICD-10-CM

## 2023-12-16 ENCOUNTER — Ambulatory Visit (INDEPENDENT_AMBULATORY_CARE_PROVIDER_SITE_OTHER): Payer: 59 | Admitting: Obstetrics

## 2023-12-16 ENCOUNTER — Other Ambulatory Visit (HOSPITAL_COMMUNITY)
Admission: RE | Admit: 2023-12-16 | Discharge: 2023-12-16 | Disposition: A | Discharge status: Home / Self Care | Payer: 59 | Source: Ambulatory Visit | Attending: Obstetrics | Admitting: Obstetrics

## 2023-12-16 ENCOUNTER — Encounter: Payer: Self-pay | Admitting: Obstetrics

## 2023-12-16 VITALS — BP 105/65 | HR 66 | Ht 70.0 in | Wt 198.0 lb

## 2023-12-16 DIAGNOSIS — Z124 Encounter for screening for malignant neoplasm of cervix: Secondary | ICD-10-CM

## 2023-12-16 DIAGNOSIS — Z113 Encounter for screening for infections with a predominantly sexual mode of transmission: Secondary | ICD-10-CM | POA: Insufficient documentation

## 2023-12-16 DIAGNOSIS — Z0000 Encounter for general adult medical examination without abnormal findings: Secondary | ICD-10-CM

## 2023-12-16 DIAGNOSIS — Z01419 Encounter for gynecological examination (general) (routine) without abnormal findings: Secondary | ICD-10-CM

## 2023-12-16 NOTE — Progress Notes (Signed)
ANNUAL GYNECOLOGICAL EXAM  SUBJECTIVE  HPI  Lori Mcgee is a 38 y.o.-year-old [id number] who presents for an annual gynecological exam today.  She denies pelvic pain, dyspareunia, abnormal vaginal bleeding or discharge, and UTI symptoms. She reports that her periods have become slightly more spaced out over the past few months with one heavy day.  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 Feels safe there Work: remote work for [employer] Exercise: lifting, basketball Substances: Occasional EtOH. Denies tobacco, vape, and recreational drugs  Obstetric History OB History     Gravida  3   Para  3   Term  3   Preterm  0   AB  0   Living  3      SAB  0   IAB  0   Ectopic  0   Multiple      Live Births  3            GYN/Menstrual History Patient's last menstrual period was 11/20/2023. Regular monthly periods Last Pap: about 3 years  Contraception: NFP  Prevention Mammogram: at 39 Colonoscopy: at 54   Current Medications No outpatient medications prior to visit.   No facility-administered medications prior to visit.      [facility] - 12/16/23 0836       Pregnancy Intention Screening   Does the patient want to become pregnant in the next year? No    Does the patient's partner want to become pregnant in the next year? No    Would the patient like to discuss contraceptive options today? No      Contraception Wrap Up   Current Method No Method - Other Reason;No Contraceptive Precautions     Contraception Counseling Provided No    How was the end contraceptive method provided? N/A            The pregnancy intention screening data noted above was reviewed. Potential methods of contraception were discussed. The patient elected to proceed with No data recorded.   ROS Constitutional: Denied constitutional symptoms, night sweats, recent illness, fatigue, fever, insomnia and weight loss.  Eyes: Denied eye symptoms, eye pain, photophobia, vision change and visual disturbance.  Ears/Nose/Throat/Neck: Denied ear, nose, throat or neck symptoms, hearing loss, nasal discharge, sinus congestion and sore throat.  Cardiovascular: Denied cardiovascular symptoms, arrhythmia, chest pain/pressure, edema, exercise intolerance, orthopnea and palpitations.  Respiratory: Denied pulmonary symptoms, asthma, pleuritic pain, productive sputum, cough, dyspnea and wheezing.  Gastrointestinal: Denied, gastro-esophageal reflux, melena, nausea and vomiting.  Genitourinary: Denied genitourinary symptoms including symptomatic vaginal discharge, pelvic relaxation issues, and urinary complaints.  Musculoskeletal: Denied musculoskeletal symptoms, stiffness, swelling, muscle weakness and myalgia.  Dermatologic: Denied dermatology symptoms, rash and scar.  Neurologic: Denied neurology symptoms, dizziness, headache, neck pain and syncope.  Psychiatric: Denied psychiatric symptoms, anxiety and depression.  Endocrine: Denied endocrine symptoms including hot flashes and night sweats.    OBJECTIVE  BP 105/65   Pulse 66   Ht 5\' 10"  (1.778 m)   Wt 198 lb (89.8 kg)  LMP 11/20/2023   BMI 28.41 kg/m    Physical examination General NAD, Conversant  HEENT Atraumatic; Op clear with mmm.  Normo-cephalic. Pupils reactive. Anicteric sclerae  Thyroid/Neck Smooth without nodularity or enlargement. Normal ROM.  Neck Supple.  Skin No rashes, lesions or ulceration. Normal palpated skin turgor. No nodularity.  Breasts: No  masses or discharge.  Symmetric.  No axillary adenopathy.  Lungs: Clear to auscultation.No rales or wheezes. Normal Respiratory effort, no retractions.  Heart: NSR.  No murmurs or rubs appreciated. No peripheral edema  Abdomen: Soft.  Non-tender.  No masses.  No HSM. No hernia  Extremities: Moves all appropriately.  Normal ROM for age. No lymphadenopathy.  Neuro: Oriented to PPT.  Normal mood. Normal affect.     Pelvic:   Vulva: Normal appearance.  No lesions.  Vagina: No lesions or abnormalities noted.  Support: Normal pelvic support.  Urethra No masses tenderness or scarring.  Meatus Normal size without lesions or prolapse.  Cervix: Normal appearance.  No lesions.  Anus: Normal exam.  No lesions.  Perineum: Normal exam.  No lesions.        Bimanual   Uterus: Normal size.  Non-tender.  Mobile.  AV.  Adnexae: No masses.  Non-tender to palpation.  Cul-de-sac: Negative for abnormality.    ASSESSMENT  1) Annual exam 2) Desires Pap  PLAN 1) Physical exam as noted. Discussed healthy lifestyle choices and preventive care. Anticipatory guidance about perimenopausal changes. Discussed fertility tracking. STI testing today. Routine labs done with PCP. 2) Reviewed ASCCP recommendations. Pap collected. F/u based on results.  Return in one year for annual exam or as needed for concerns.   Guadlupe Spanish, CNM

## 2023-12-17 LAB — HEP, RPR, HIV PANEL
HIV Screen 4th Generation wRfx: NONREACTIVE
Hepatitis B Surface Ag: NEGATIVE
RPR Ser Ql: NONREACTIVE

## 2023-12-22 LAB — CYTOLOGY - PAP
Adequacy: ABSENT
Chlamydia: NEGATIVE
Comment: NEGATIVE
Comment: NEGATIVE
Comment: NEGATIVE
Comment: NORMAL
Diagnosis: NEGATIVE
Diagnosis: REACTIVE
High risk HPV: NEGATIVE
Neisseria Gonorrhea: NEGATIVE
Trichomonas: NEGATIVE

## 2023-12-23 ENCOUNTER — Other Ambulatory Visit: Payer: Self-pay | Admitting: Obstetrics

## 2023-12-23 ENCOUNTER — Encounter: Payer: Self-pay | Admitting: Obstetrics

## 2023-12-23 MED ORDER — FLUCONAZOLE 150 MG PO TABS: 150.0000 mg | ORAL_TABLET | Freq: Once | ORAL | 0 refills | Status: AC

## 2024-03-25 ENCOUNTER — Ambulatory Visit: Payer: Self-pay | Admitting: Primary Care

## 2024-03-25 ENCOUNTER — Telehealth: Payer: Self-pay

## 2024-03-25 ENCOUNTER — Encounter: Payer: Self-pay | Admitting: Primary Care

## 2024-03-25 ENCOUNTER — Ambulatory Visit: Admitting: Primary Care

## 2024-03-25 ENCOUNTER — Other Ambulatory Visit: Payer: Self-pay | Admitting: Primary Care

## 2024-03-25 VITALS — BP 138/82 | HR 118 | Temp 97.2°F | Ht 70.0 in | Wt 201.0 lb

## 2024-03-25 DIAGNOSIS — L03317 Cellulitis of buttock: Secondary | ICD-10-CM | POA: Diagnosis not present

## 2024-03-25 DIAGNOSIS — L0231 Cutaneous abscess of buttock: Secondary | ICD-10-CM | POA: Insufficient documentation

## 2024-03-25 DIAGNOSIS — E785 Hyperlipidemia, unspecified: Secondary | ICD-10-CM

## 2024-03-25 DIAGNOSIS — E663 Overweight: Secondary | ICD-10-CM | POA: Diagnosis not present

## 2024-03-25 LAB — LIPID PANEL
Cholesterol: 205 mg/dL — ABNORMAL HIGH (ref 0–200)
HDL: 54.4 mg/dL (ref >39.00)
LDL Cholesterol: 128 mg/dL — ABNORMAL HIGH (ref 0–99)
NonHDL: 150.91
Total CHOL/HDL Ratio: 4
Triglycerides: 113 mg/dL (ref 0.0–149.0)
VLDL: 22.6 mg/dL (ref 0.0–40.0)

## 2024-03-25 MED ORDER — SULFAMETHOXAZOLE-TRIMETHOPRIM 800-160 MG PO TABS
1.0000 | ORAL_TABLET | Freq: Two times a day (BID) | ORAL | 0 refills | Status: AC
Start: 2024-03-25 — End: ?

## 2024-03-25 MED ORDER — SEMAGLUTIDE-WEIGHT MANAGEMENT 0.25 MG/0.5ML ~~LOC~~ SOAJ: 0.2500 mg | SUBCUTANEOUS | 0 refills | Status: AC

## 2024-03-25 NOTE — Assessment & Plan Note (Addendum)
 She doesn't qualify for GLP 1 agonist treatment based on BMI alone, but she does have a history of hyperlipidemia.  Repeat lipid panel pending. If lipids are elevated then we will proceed with [facility].  We discussed instructions for administration, potential side effects.  Do not Await results.   We did discuss to cut back on carbs in the form of rice and Pasta. Continue regular exercise.

## 2024-03-25 NOTE — Telephone Encounter (Signed)
 Pharmacy Patient Advocate Encounter   Received notification from CoverMyMeds that prior authorization for [facility] 0.25MG /0.5ML auto-injectors is required/requested.   Insurance verification completed.   The patient is insured through [facility] .   Per test claim: PA required; PA started via CoverMyMeds. KEY [id number] . Waiting for clinical questions to populate.

## 2024-03-25 NOTE — Assessment & Plan Note (Signed)
 Exam today consistent.  Start Bactrim DS (sulfamethoxazole/trimethoprim) tablets. Take 1 tablet by mouth twice daily for 10 days. She will update if no improvement.

## 2024-03-25 NOTE — Patient Instructions (Signed)
 Start Bactrim DS (sulfamethoxazole/trimethoprim) tablets. Take 1 tablet by mouth twice daily for 10 days.  Stop by the lab prior to leaving today. I will notify you of your results once received.   It was a pleasure to see you today!

## 2024-03-25 NOTE — Progress Notes (Signed)
 Subjective:    Patient ID: Lori Mcgee, female    DOB: September 08, 1986, 38 y.o.   MRN: [id number]  HPI  Lori Mcgee is a very pleasant 38 y.o. female who presents today to discuss skin mass. She would also like to discuss GLP1 agonist treatment for weight loss.   1) Skin Mass: Symptom onset two weeks ago with a skin mass to the right buttocks. Over the last several days she's noticed the mass to be red/hot, painful to touch, painful to sit. Itchy.   Her husband tried to pop the spot without anything draining. She's applied hydrocortisone cream without improvement.   2) Overweight: Chronic history. Has had difficulty losing weight for years despite her efforts. She's tried cutting out sugar, soda, iced coffees with temporary weight loss. She's tried My Fitness Pal, Noom, 21 day fix with temporary weight loss. She was able to lose a lot of weight in her early 30s by going on a diet of chicken and veggies. This was not sustainable.   She is interested in GLP 1 agonist treatment.    Diet currently consists of:  Breakfast: Eggs, [place] bacon or avocado toast Lunch: [place], sweet potatoes, grilled chicken with rice, wrap, salad Dinner: Pasta grilled fish with rice, grilled chicken with rice Snacks: Occasionally, nuts, cashews Desserts: Occasionally  Beverages: Water mostly, regular soda 1-2  Exercise: 4-5 days weekly.   Body mass index is 28.84 kg/m.   Review of Systems  Constitutional:  Negative for fever.  Respiratory:  Negative for shortness of breath.   Cardiovascular:  Negative for chest pain.  Skin:  Positive for color change and wound.         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   3 children.   Works for [employer] in [employer]   Enjoys exercising and playing basketball.     Social Drivers of Corporate investment banker Strain: Not on file  Food Insecurity: Not on file  Transportation Needs: Not on file  Physical Activity: Not on file  Stress: Not on file  Social Connections: Unknown (03/19/2022)   [facility], [facility]   Social Network    Social Network: Not on file  Intimate Partner Violence: Unknown (02/08/2022)   Received from [facility], [facility]   HITS    Physically Hurt: Not on file    Insult or Talk Down To: Not on file    Threaten Physical Harm: Not on file    Scream or Curse: Not on file    Past Surgical History:  Procedure Laterality Date   HYSTEROSCOPY N/A 10/31/2016   Procedure: HYSTEROSCOPY WITH IUD REMOVAL;  Surgeon: Teresa Fender, MD;  Location: [facility] ORS;  Service: Gynecology;  Laterality: N/A;   iud extraction  10/31/2016   WISDOM TOOTH EXTRACTION      Family History  Problem Relation Age of Onset   Diabetes Father    Hypertension Father    Hypothyroidism Father    Hypertension Mother    Breast cancer Paternal Aunt        late 57's or early 109's    No Known Allergies  No current outpatient medications on file prior to visit.   No current facility-administered medications on file prior to visit.    BP 138/82   Pulse (!) 118   Temp (!) 97.2 F (36.2 C) (Temporal)   Ht 5\' 10"  (1.778 m)   Wt 201 lb (91.2 kg)   LMP 02/25/2024 (Approximate)   SpO2 97%   BMI 28.84 kg/m  Objective:    Physical Exam Cardiovascular:     Rate and Rhythm: Normal rate.  Pulmonary:     Effort: Pulmonary effort is normal.  Musculoskeletal:     Cervical back: Neck supple.  Skin:    General: Skin is warm and dry.     Findings: Erythema present.     Comments: 3 cm deep oval appearing mass to right lower buttocks. Warmth and erythema to skin. Tenderness.   Neurological:     Mental Status: She is alert and oriented to person, place, and time.  Psychiatric:        Mood and Affect: Mood normal.           Assessment & Plan:  Cellulitis and abscess of buttock Assessment & Plan: Exam today consistent.  Start Bactrim DS (sulfamethoxazole/trimethoprim) tablets. Take 1 tablet by mouth twice daily for 10 days. She will update if no improvement.  Orders: -     Sulfamethoxazole-Trimethoprim; Take 1 tablet by mouth 2 (two) times daily.  Dispense: 20 tablet; Refill: 0  Overweight (BMI 25.0-29.9) Assessment & Plan: She doesn't qualify for GLP 1 agonist treatment based on BMI alone, but she does have a history of hyperlipidemia.  Repeat lipid panel pending. If lipids are elevated then we will proceed with [facility].  We discussed instructions for administration, potential side effects.  Do not Await results.   We did discuss to cut back on carbs in the form of rice and Pasta. Continue regular exercise.  Orders: -     Lipid panel        Lori John, NP

## 2024-03-31 NOTE — Telephone Encounter (Signed)
 Questions answered. Prior Auth submitted.

## 2024-04-01 NOTE — Telephone Encounter (Signed)
 Pharmacy Patient Advocate Encounter  Received notification from [facility] that Prior Authorization for [facility]   has been DENIED.  Full denial letter will be uploaded to the media tab. See denial reason below.   PA #/Case ID/Reference #: [id number]

## 2024-07-20 DIAGNOSIS — M25562 Pain in left knee: Secondary | ICD-10-CM | POA: Diagnosis not present

## 2024-07-20 DIAGNOSIS — M25551 Pain in right hip: Secondary | ICD-10-CM | POA: Diagnosis not present

## 2024-07-20 DIAGNOSIS — R293 Abnormal posture: Secondary | ICD-10-CM | POA: Diagnosis not present

## 2024-07-22 DIAGNOSIS — M25562 Pain in left knee: Secondary | ICD-10-CM | POA: Diagnosis not present

## 2024-07-22 DIAGNOSIS — M25551 Pain in right hip: Secondary | ICD-10-CM | POA: Diagnosis not present

## 2024-07-22 DIAGNOSIS — R293 Abnormal posture: Secondary | ICD-10-CM | POA: Diagnosis not present

## 2024-07-27 DIAGNOSIS — M25551 Pain in right hip: Secondary | ICD-10-CM | POA: Diagnosis not present

## 2024-07-27 DIAGNOSIS — R293 Abnormal posture: Secondary | ICD-10-CM | POA: Diagnosis not present

## 2024-07-27 DIAGNOSIS — M25562 Pain in left knee: Secondary | ICD-10-CM | POA: Diagnosis not present

## 2024-08-03 DIAGNOSIS — R293 Abnormal posture: Secondary | ICD-10-CM | POA: Diagnosis not present

## 2024-08-03 DIAGNOSIS — M25562 Pain in left knee: Secondary | ICD-10-CM | POA: Diagnosis not present

## 2024-08-03 DIAGNOSIS — M25551 Pain in right hip: Secondary | ICD-10-CM | POA: Diagnosis not present

## 2024-08-06 DIAGNOSIS — M25562 Pain in left knee: Secondary | ICD-10-CM | POA: Diagnosis not present

## 2024-08-06 DIAGNOSIS — R293 Abnormal posture: Secondary | ICD-10-CM | POA: Diagnosis not present

## 2024-08-06 DIAGNOSIS — M25551 Pain in right hip: Secondary | ICD-10-CM | POA: Diagnosis not present

## 2024-08-16 DIAGNOSIS — R293 Abnormal posture: Secondary | ICD-10-CM | POA: Diagnosis not present

## 2024-08-16 DIAGNOSIS — M25562 Pain in left knee: Secondary | ICD-10-CM | POA: Diagnosis not present

## 2024-08-16 DIAGNOSIS — M25551 Pain in right hip: Secondary | ICD-10-CM | POA: Diagnosis not present

## 2024-08-24 DIAGNOSIS — R293 Abnormal posture: Secondary | ICD-10-CM | POA: Diagnosis not present

## 2024-08-24 DIAGNOSIS — M25562 Pain in left knee: Secondary | ICD-10-CM | POA: Diagnosis not present

## 2024-08-24 DIAGNOSIS — M25551 Pain in right hip: Secondary | ICD-10-CM | POA: Diagnosis not present

## 2024-09-03 DIAGNOSIS — M25551 Pain in right hip: Secondary | ICD-10-CM | POA: Diagnosis not present

## 2024-09-03 DIAGNOSIS — M25562 Pain in left knee: Secondary | ICD-10-CM | POA: Diagnosis not present

## 2024-09-03 DIAGNOSIS — R293 Abnormal posture: Secondary | ICD-10-CM | POA: Diagnosis not present

## 2024-09-09 DIAGNOSIS — R293 Abnormal posture: Secondary | ICD-10-CM | POA: Diagnosis not present

## 2024-09-09 DIAGNOSIS — M25562 Pain in left knee: Secondary | ICD-10-CM | POA: Diagnosis not present

## 2024-09-09 DIAGNOSIS — M25551 Pain in right hip: Secondary | ICD-10-CM | POA: Diagnosis not present

## 2024-09-14 DIAGNOSIS — R293 Abnormal posture: Secondary | ICD-10-CM | POA: Diagnosis not present

## 2024-09-14 DIAGNOSIS — M25551 Pain in right hip: Secondary | ICD-10-CM | POA: Diagnosis not present

## 2024-09-14 DIAGNOSIS — M25562 Pain in left knee: Secondary | ICD-10-CM | POA: Diagnosis not present

## 2024-10-06 DIAGNOSIS — R293 Abnormal posture: Secondary | ICD-10-CM | POA: Diagnosis not present

## 2024-10-06 DIAGNOSIS — M25551 Pain in right hip: Secondary | ICD-10-CM | POA: Diagnosis not present

## 2024-10-06 DIAGNOSIS — M25562 Pain in left knee: Secondary | ICD-10-CM | POA: Diagnosis not present
